# Patient Record
Sex: Male | Born: 1955
Health system: Southern US, Community
[De-identification: ages and names within clinical notes are randomized; demographics above are authoritative.]

## PROBLEM LIST (undated history)

## (undated) DIAGNOSIS — Z87442 Personal history of urinary calculi: Secondary | ICD-10-CM

## (undated) DIAGNOSIS — I10 Essential (primary) hypertension: Secondary | ICD-10-CM

## (undated) DIAGNOSIS — K635 Polyp of colon: Secondary | ICD-10-CM

## (undated) DIAGNOSIS — R011 Cardiac murmur, unspecified: Secondary | ICD-10-CM

## (undated) DIAGNOSIS — R519 Headache, unspecified: Secondary | ICD-10-CM

## (undated) DIAGNOSIS — M1990 Unspecified osteoarthritis, unspecified site: Secondary | ICD-10-CM

## (undated) DIAGNOSIS — E669 Obesity, unspecified: Secondary | ICD-10-CM

## (undated) DIAGNOSIS — Z720 Tobacco use: Secondary | ICD-10-CM

## (undated) DIAGNOSIS — T8859XA Other complications of anesthesia, initial encounter: Secondary | ICD-10-CM

## (undated) DIAGNOSIS — T4145XA Adverse effect of unspecified anesthetic, initial encounter: Secondary | ICD-10-CM

## (undated) DIAGNOSIS — I2510 Atherosclerotic heart disease of native coronary artery without angina pectoris: Secondary | ICD-10-CM

## (undated) DIAGNOSIS — Z9289 Personal history of other medical treatment: Secondary | ICD-10-CM

## (undated) DIAGNOSIS — I8290 Acute embolism and thrombosis of unspecified vein: Secondary | ICD-10-CM

## (undated) DIAGNOSIS — E785 Hyperlipidemia, unspecified: Secondary | ICD-10-CM

## (undated) DIAGNOSIS — R51 Headache: Secondary | ICD-10-CM

## (undated) HISTORY — DX: Atherosclerotic heart disease of native coronary artery without angina pectoris: I25.10

## (undated) HISTORY — DX: Hyperlipidemia, unspecified: E78.5

## (undated) HISTORY — DX: Essential (primary) hypertension: I10

## (undated) HISTORY — PX: HEMORRHOID SURGERY: SHX153

## (undated) HISTORY — DX: Obesity, unspecified: E66.9

## (undated) HISTORY — DX: Tobacco use: Z72.0

## (undated) HISTORY — DX: Polyp of colon: K63.5

## (undated) HISTORY — PX: CORONARY ARTERY BYPASS GRAFT: SHX141

## (undated) HISTORY — PX: CARDIAC CATHETERIZATION: SHX172

---

## 1984-01-11 HISTORY — PX: VASECTOMY: SHX75

## 1984-01-11 HISTORY — PX: CIRCUMCISION: SUR203

## 1997-07-03 ENCOUNTER — Inpatient Hospital Stay (HOSPITAL_COMMUNITY): Admission: AD | Admit: 1997-07-03 | Discharge: 1997-07-04 | Payer: Self-pay | Admitting: Internal Medicine

## 1997-10-06 ENCOUNTER — Other Ambulatory Visit: Admission: RE | Admit: 1997-10-06 | Discharge: 1997-10-06 | Payer: Self-pay | Admitting: Gastroenterology

## 1997-11-17 ENCOUNTER — Ambulatory Visit (HOSPITAL_COMMUNITY): Admission: RE | Admit: 1997-11-17 | Discharge: 1997-11-17 | Payer: Self-pay | Admitting: Gastroenterology

## 2001-05-02 ENCOUNTER — Ambulatory Visit (HOSPITAL_COMMUNITY): Admission: RE | Admit: 2001-05-02 | Discharge: 2001-05-02 | Payer: Self-pay | Admitting: Internal Medicine

## 2002-04-15 ENCOUNTER — Encounter: Payer: Self-pay | Admitting: Gastroenterology

## 2002-04-18 ENCOUNTER — Emergency Department (HOSPITAL_COMMUNITY): Admission: EM | Admit: 2002-04-18 | Discharge: 2002-04-18 | Payer: Self-pay | Admitting: Emergency Medicine

## 2002-04-18 ENCOUNTER — Encounter: Payer: Self-pay | Admitting: Emergency Medicine

## 2003-03-26 ENCOUNTER — Emergency Department (HOSPITAL_COMMUNITY): Admission: EM | Admit: 2003-03-26 | Discharge: 2003-03-26 | Payer: Self-pay | Admitting: Emergency Medicine

## 2004-01-08 ENCOUNTER — Ambulatory Visit: Payer: Self-pay | Admitting: Internal Medicine

## 2004-09-28 ENCOUNTER — Ambulatory Visit: Payer: Self-pay | Admitting: Internal Medicine

## 2004-10-19 ENCOUNTER — Ambulatory Visit: Payer: Self-pay | Admitting: Gastroenterology

## 2004-12-15 ENCOUNTER — Ambulatory Visit: Payer: Self-pay | Admitting: Internal Medicine

## 2005-01-20 ENCOUNTER — Ambulatory Visit: Payer: Self-pay | Admitting: Internal Medicine

## 2005-05-11 ENCOUNTER — Ambulatory Visit: Payer: Self-pay | Admitting: Internal Medicine

## 2005-05-23 ENCOUNTER — Ambulatory Visit: Payer: Self-pay

## 2005-06-13 ENCOUNTER — Ambulatory Visit: Payer: Self-pay | Admitting: Pulmonary Disease

## 2005-06-22 ENCOUNTER — Ambulatory Visit: Payer: Self-pay | Admitting: Internal Medicine

## 2005-10-11 ENCOUNTER — Ambulatory Visit: Payer: Self-pay | Admitting: Internal Medicine

## 2005-12-02 ENCOUNTER — Ambulatory Visit: Payer: Self-pay | Admitting: Internal Medicine

## 2006-08-10 ENCOUNTER — Ambulatory Visit: Payer: Self-pay | Admitting: Gastroenterology

## 2006-08-10 LAB — CONVERTED CEMR LAB
Basophils Absolute: 0 10*3/uL (ref 0.0–0.1)
Basophils Relative: 0.9 % (ref 0.0–1.0)
Eosinophils Absolute: 0.1 10*3/uL (ref 0.0–0.6)
Eosinophils Relative: 1.7 % (ref 0.0–5.0)
Ferritin: 69.4 ng/mL (ref 22.0–322.0)
HCT: 37.5 % — ABNORMAL LOW (ref 39.0–52.0)
Hemoglobin: 13.3 g/dL (ref 13.0–17.0)
Iron: 93 ug/dL (ref 42–165)
Lymphocytes Relative: 30.4 % (ref 12.0–46.0)
MCHC: 35.4 g/dL (ref 30.0–36.0)
MCV: 89.4 fL (ref 78.0–100.0)
Monocytes Absolute: 0.4 10*3/uL (ref 0.2–0.7)
Monocytes Relative: 7.8 % (ref 3.0–11.0)
Neutro Abs: 3.3 10*3/uL (ref 1.4–7.7)
Neutrophils Relative %: 59.2 % (ref 43.0–77.0)
Platelets: 249 10*3/uL (ref 150–400)
RBC: 4.2 M/uL — ABNORMAL LOW (ref 4.22–5.81)
RDW: 12 % (ref 11.5–14.6)
Saturation Ratios: 23.1 % (ref 20.0–50.0)
Transferrin: 287.6 mg/dL (ref >=212.0)
WBC: 5.4 10*3/uL (ref 4.5–10.5)

## 2006-10-19 ENCOUNTER — Ambulatory Visit (HOSPITAL_COMMUNITY): Admission: RE | Admit: 2006-10-19 | Discharge: 2006-10-19 | Payer: Self-pay | Admitting: General Surgery

## 2006-10-19 ENCOUNTER — Encounter (HOSPITAL_BASED_OUTPATIENT_CLINIC_OR_DEPARTMENT_OTHER): Payer: Self-pay | Admitting: General Surgery

## 2006-10-19 HISTORY — PX: OTHER SURGICAL HISTORY: SHX169

## 2007-02-23 ENCOUNTER — Telehealth (INDEPENDENT_AMBULATORY_CARE_PROVIDER_SITE_OTHER): Payer: Self-pay | Admitting: *Deleted

## 2007-03-06 ENCOUNTER — Ambulatory Visit: Payer: Self-pay | Admitting: Internal Medicine

## 2007-03-06 DIAGNOSIS — R079 Chest pain, unspecified: Secondary | ICD-10-CM | POA: Insufficient documentation

## 2007-05-09 ENCOUNTER — Ambulatory Visit: Payer: Self-pay | Admitting: Internal Medicine

## 2007-05-09 DIAGNOSIS — E785 Hyperlipidemia, unspecified: Secondary | ICD-10-CM | POA: Insufficient documentation

## 2007-05-09 DIAGNOSIS — E1169 Type 2 diabetes mellitus with other specified complication: Secondary | ICD-10-CM | POA: Insufficient documentation

## 2007-05-09 DIAGNOSIS — E7800 Pure hypercholesterolemia, unspecified: Secondary | ICD-10-CM | POA: Insufficient documentation

## 2007-05-09 DIAGNOSIS — N320 Bladder-neck obstruction: Secondary | ICD-10-CM | POA: Insufficient documentation

## 2007-05-10 LAB — CONVERTED CEMR LAB
ALT: 31 units/L (ref 0–53)
AST: 30 units/L (ref 0–37)
Albumin: 4.6 g/dL (ref 3.5–5.2)
Alkaline Phosphatase: 37 units/L — ABNORMAL LOW (ref 39–117)
BUN: 13 mg/dL (ref 6–23)
Basophils Absolute: 0 10*3/uL (ref 0.0–0.1)
Basophils Relative: 0.6 % (ref 0.0–1.0)
Bilirubin Urine: NEGATIVE
Bilirubin, Direct: 0.1 mg/dL (ref 0.0–0.3)
CO2: 33 meq/L — ABNORMAL HIGH (ref 19–32)
Calcium: 9.7 mg/dL (ref 8.4–10.5)
Chloride: 103 meq/L (ref 96–112)
Cholesterol: 146 mg/dL (ref 0–200)
Creatinine, Ser: 1 mg/dL (ref 0.4–1.5)
Eosinophils Absolute: 0.1 10*3/uL (ref 0.0–0.7)
Eosinophils Relative: 1.7 % (ref 0.0–5.0)
GFR calc Af Amer: 101 mL/min
GFR calc non Af Amer: 83 mL/min
Glucose, Bld: 107 mg/dL — ABNORMAL HIGH (ref 70–99)
HCT: 39.5 % (ref 39.0–52.0)
HDL: 28.9 mg/dL — ABNORMAL LOW (ref >39.0)
Hemoglobin, Urine: NEGATIVE
Hemoglobin: 13.5 g/dL (ref 13.0–17.0)
Ketones, ur: NEGATIVE mg/dL
LDL Cholesterol: 101 mg/dL — ABNORMAL HIGH (ref 0–99)
Leukocytes, UA: NEGATIVE
Lymphocytes Relative: 30.1 % (ref 12.0–46.0)
MCHC: 34.3 g/dL (ref 30.0–36.0)
MCV: 91.6 fL (ref 78.0–100.0)
Monocytes Absolute: 0.5 10*3/uL (ref 0.1–1.0)
Monocytes Relative: 8.8 % (ref 3.0–12.0)
Neutro Abs: 3.2 10*3/uL (ref 1.4–7.7)
Neutrophils Relative %: 58.8 % (ref 43.0–77.0)
Nitrite: NEGATIVE
PSA: 0.43 ng/mL (ref 0.10–4.00)
Platelets: 232 10*3/uL (ref 150–400)
Potassium: 4.1 meq/L (ref 3.5–5.1)
RBC: 4.31 M/uL (ref 4.22–5.81)
RDW: 13.6 % (ref 11.5–14.6)
Sodium: 141 meq/L (ref 135–145)
Specific Gravity, Urine: 1.02 (ref 1.000–1.03)
TSH: 1.09 microintl units/mL (ref 0.35–5.50)
Total Bilirubin: 1 mg/dL (ref 0.3–1.2)
Total CHOL/HDL Ratio: 5.1
Total Protein, Urine: NEGATIVE mg/dL
Total Protein: 7.4 g/dL (ref 6.0–8.3)
Triglycerides: 79 mg/dL (ref 0–149)
Urine Glucose: NEGATIVE mg/dL
Urobilinogen, UA: 0.2 (ref 0.0–1.0)
VLDL: 16 mg/dL (ref 0–40)
WBC: 5.5 10*3/uL (ref 4.5–10.5)
pH: 6 (ref 5.0–8.0)

## 2007-05-26 DIAGNOSIS — D126 Benign neoplasm of colon, unspecified: Secondary | ICD-10-CM | POA: Insufficient documentation

## 2007-05-26 DIAGNOSIS — J309 Allergic rhinitis, unspecified: Secondary | ICD-10-CM | POA: Insufficient documentation

## 2007-05-26 DIAGNOSIS — E669 Obesity, unspecified: Secondary | ICD-10-CM | POA: Insufficient documentation

## 2007-05-26 DIAGNOSIS — K648 Other hemorrhoids: Secondary | ICD-10-CM | POA: Insufficient documentation

## 2007-05-26 DIAGNOSIS — E785 Hyperlipidemia, unspecified: Secondary | ICD-10-CM | POA: Insufficient documentation

## 2007-05-26 DIAGNOSIS — Z87891 Personal history of nicotine dependence: Secondary | ICD-10-CM | POA: Insufficient documentation

## 2007-05-26 DIAGNOSIS — K5909 Other constipation: Secondary | ICD-10-CM | POA: Insufficient documentation

## 2007-05-26 DIAGNOSIS — I259 Chronic ischemic heart disease, unspecified: Secondary | ICD-10-CM | POA: Insufficient documentation

## 2007-05-26 DIAGNOSIS — I10 Essential (primary) hypertension: Secondary | ICD-10-CM | POA: Insufficient documentation

## 2007-05-26 DIAGNOSIS — Z8719 Personal history of other diseases of the digestive system: Secondary | ICD-10-CM | POA: Insufficient documentation

## 2007-11-20 ENCOUNTER — Ambulatory Visit: Payer: Self-pay | Admitting: Internal Medicine

## 2008-04-18 ENCOUNTER — Ambulatory Visit: Payer: Self-pay | Admitting: Internal Medicine

## 2008-07-17 ENCOUNTER — Ambulatory Visit: Payer: Self-pay | Admitting: Internal Medicine

## 2008-07-17 LAB — CONVERTED CEMR LAB
ALT: 43 units/L (ref 0–53)
AST: 41 units/L — ABNORMAL HIGH (ref 0–37)
Albumin: 4.7 g/dL (ref 3.5–5.2)
Alkaline Phosphatase: 44 units/L (ref 39–117)
BUN: 22 mg/dL (ref 6–23)
Basophils Absolute: 0 10*3/uL (ref 0.0–0.1)
Basophils Relative: 0.3 % (ref 0.0–3.0)
Bilirubin Urine: NEGATIVE
Bilirubin, Direct: 0.2 mg/dL (ref 0.0–0.3)
CO2: 31 meq/L (ref 19–32)
CRP: 0.1 mg/dL (ref <0.6)
Calcium: 9.6 mg/dL (ref 8.4–10.5)
Chloride: 102 meq/L (ref 96–112)
Cholesterol: 124 mg/dL (ref 0–200)
Creatinine, Ser: 1 mg/dL (ref 0.4–1.5)
Eosinophils Absolute: 0.1 10*3/uL (ref 0.0–0.7)
Eosinophils Relative: 1.9 % (ref 0.0–5.0)
GFR calc non Af Amer: 100.17 mL/min (ref >60)
Glucose, Bld: 106 mg/dL — ABNORMAL HIGH (ref 70–99)
HCT: 40.3 % (ref 39.0–52.0)
HDL: 32.7 mg/dL — ABNORMAL LOW (ref >39.00)
Hemoglobin, Urine: NEGATIVE
Hemoglobin: 13.9 g/dL (ref 13.0–17.0)
Ketones, ur: NEGATIVE mg/dL
LDL Cholesterol: 76 mg/dL (ref 0–99)
Leukocytes, UA: NEGATIVE
Lymphocytes Relative: 34.8 % (ref 12.0–46.0)
Lymphs Abs: 1.8 10*3/uL (ref 0.7–4.0)
MCHC: 34.6 g/dL (ref 30.0–36.0)
MCV: 90.3 fL (ref 78.0–100.0)
Monocytes Absolute: 0.5 10*3/uL (ref 0.1–1.0)
Monocytes Relative: 9.1 % (ref 3.0–12.0)
Neutro Abs: 2.8 10*3/uL (ref 1.4–7.7)
Neutrophils Relative %: 53.9 % (ref 43.0–77.0)
Nitrite: NEGATIVE
PSA: 0.46 ng/mL (ref 0.10–4.00)
Platelets: 233 10*3/uL (ref 150.0–400.0)
Potassium: 4.5 meq/L (ref 3.5–5.1)
RBC: 4.46 M/uL (ref 4.22–5.81)
RDW: 12.7 % (ref 11.5–14.6)
Sodium: 141 meq/L (ref 135–145)
Specific Gravity, Urine: >=1.03 (ref 1.000–1.030)
TSH: 0.95 microintl units/mL (ref 0.35–5.50)
Total Bilirubin: 1.2 mg/dL (ref 0.3–1.2)
Total CHOL/HDL Ratio: 4
Total Protein, Urine: NEGATIVE mg/dL
Total Protein: 7.6 g/dL (ref 6.0–8.3)
Triglycerides: 77 mg/dL (ref 0.0–149.0)
Urine Glucose: NEGATIVE mg/dL
Urobilinogen, UA: 0.2 (ref 0.0–1.0)
VLDL: 15.4 mg/dL (ref 0.0–40.0)
WBC: 5.2 10*3/uL (ref 4.5–10.5)
pH: 6 (ref 5.0–8.0)

## 2009-01-14 ENCOUNTER — Ambulatory Visit: Payer: Self-pay | Admitting: Internal Medicine

## 2009-01-14 DIAGNOSIS — R059 Cough, unspecified: Secondary | ICD-10-CM | POA: Insufficient documentation

## 2009-01-14 DIAGNOSIS — R05 Cough: Secondary | ICD-10-CM | POA: Insufficient documentation

## 2009-06-07 ENCOUNTER — Encounter: Payer: Self-pay | Admitting: Internal Medicine

## 2009-06-09 ENCOUNTER — Telehealth: Payer: Self-pay | Admitting: Internal Medicine

## 2009-06-15 ENCOUNTER — Ambulatory Visit: Payer: Self-pay | Admitting: Internal Medicine

## 2009-06-15 ENCOUNTER — Telehealth (INDEPENDENT_AMBULATORY_CARE_PROVIDER_SITE_OTHER): Payer: Self-pay | Admitting: *Deleted

## 2009-08-03 ENCOUNTER — Ambulatory Visit: Payer: Self-pay | Admitting: Internal Medicine

## 2009-08-03 ENCOUNTER — Encounter (INDEPENDENT_AMBULATORY_CARE_PROVIDER_SITE_OTHER): Payer: Self-pay | Admitting: *Deleted

## 2009-08-03 LAB — CONVERTED CEMR LAB
ALT: 41 units/L (ref 0–53)
AST: 40 units/L — ABNORMAL HIGH (ref 0–37)
Albumin: 4.7 g/dL (ref 3.5–5.2)
Alkaline Phosphatase: 42 units/L (ref 39–117)
BUN: 20 mg/dL (ref 6–23)
Basophils Absolute: 0.1 10*3/uL (ref 0.0–0.1)
Basophils Relative: 1 % (ref 0.0–3.0)
Bilirubin Urine: NEGATIVE
Bilirubin, Direct: 0.1 mg/dL (ref 0.0–0.3)
CO2: 30 meq/L (ref 19–32)
Calcium: 9.3 mg/dL (ref 8.4–10.5)
Chloride: 100 meq/L (ref 96–112)
Cholesterol: 128 mg/dL (ref 0–200)
Creatinine, Ser: 0.9 mg/dL (ref 0.4–1.5)
Eosinophils Absolute: 0.1 10*3/uL (ref 0.0–0.7)
Eosinophils Relative: 1.2 % (ref 0.0–5.0)
GFR calc non Af Amer: 111.25 mL/min (ref >60)
Glucose, Bld: 112 mg/dL — ABNORMAL HIGH (ref 70–99)
HCT: 38.2 % — ABNORMAL LOW (ref 39.0–52.0)
HDL: 31.7 mg/dL — ABNORMAL LOW (ref >39.00)
Hemoglobin, Urine: NEGATIVE
Hemoglobin: 13.4 g/dL (ref 13.0–17.0)
Ketones, ur: NEGATIVE mg/dL
LDL Cholesterol: 81 mg/dL (ref 0–99)
Leukocytes, UA: NEGATIVE
Lymphocytes Relative: 29.7 % (ref 12.0–46.0)
Lymphs Abs: 1.7 10*3/uL (ref 0.7–4.0)
MCHC: 35.1 g/dL (ref 30.0–36.0)
MCV: 91.1 fL (ref 78.0–100.0)
Monocytes Absolute: 0.2 10*3/uL (ref 0.1–1.0)
Monocytes Relative: 4 % (ref 3.0–12.0)
Neutro Abs: 3.6 10*3/uL (ref 1.4–7.7)
Neutrophils Relative %: 64.1 % (ref 43.0–77.0)
Nitrite: NEGATIVE
PSA: 0.52 ng/mL (ref 0.10–4.00)
Platelets: 228 10*3/uL (ref 150.0–400.0)
Potassium: 4 meq/L (ref 3.5–5.1)
RBC: 4.19 M/uL — ABNORMAL LOW (ref 4.22–5.81)
RDW: 13.1 % (ref 11.5–14.6)
Sodium: 136 meq/L (ref 135–145)
Specific Gravity, Urine: 1.02 (ref 1.000–1.030)
TSH: 0.97 microintl units/mL (ref 0.35–5.50)
Total Bilirubin: 0.6 mg/dL (ref 0.3–1.2)
Total CHOL/HDL Ratio: 4
Total Protein, Urine: NEGATIVE mg/dL
Total Protein: 7.4 g/dL (ref 6.0–8.3)
Triglycerides: 75 mg/dL (ref 0.0–149.0)
Urine Glucose: NEGATIVE mg/dL
Urobilinogen, UA: 0.2 (ref 0.0–1.0)
VLDL: 15 mg/dL (ref 0.0–40.0)
WBC: 5.6 10*3/uL (ref 4.5–10.5)
pH: 7 (ref 5.0–8.0)

## 2009-08-04 ENCOUNTER — Ambulatory Visit: Payer: Self-pay | Admitting: Internal Medicine

## 2009-08-28 ENCOUNTER — Encounter (INDEPENDENT_AMBULATORY_CARE_PROVIDER_SITE_OTHER): Payer: Self-pay | Admitting: *Deleted

## 2009-09-02 ENCOUNTER — Ambulatory Visit: Payer: Self-pay | Admitting: Gastroenterology

## 2009-09-16 ENCOUNTER — Ambulatory Visit: Payer: Self-pay | Admitting: Gastroenterology

## 2009-09-22 ENCOUNTER — Encounter: Payer: Self-pay | Admitting: Gastroenterology

## 2010-02-09 NOTE — Letter (Signed)
Summary: Previsit letter  [facility]  [address] [place], [place] [address]   Phone: [phone]  Fax: [phone]       08/03/2009 MRN: [id number]  [facility] [address] [place], [place]  [address]  Dear Mr. [name] [name],  Welcome to the Gastroenterology Division at [facility].    You are scheduled to see a nurse for your pre-procedure visit on 09-02-09 at 9:00A.M. on the 3rd floor at [facility], [address]  We ask that you try to arrive at our office 15 minutes prior to your appointment time to allow for check-in.  Your nurse visit will consist of discussing your medical and surgical history, your immediate family medical history, and your medications.    Please bring a complete list of all your medications or, if you prefer, bring the medication bottles and we will list them.  We will need to be aware of both prescribed and over the counter drugs.  We will need to know exact dosage information as well.  If you are on blood thinners (Coumadin, Plavix, Aggrenox, Ticlid, etc.) please call our office today/prior to your appointment, as we need to consult with your physician about holding your medication.   Please be prepared to read and sign documents such as consent forms, a financial agreement, and acknowledgement forms.  If necessary, and with your consent, a friend or relative is welcome to sit-in on the nurse visit with you.  Please bring your insurance card so that we may make a copy of it.  If your insurance requires a referral to see a specialist, please bring your referral form from your primary care physician.  No co-pay is required for this nurse visit.     If you cannot keep your appointment, please call [phone] to cancel or reschedule prior to your appointment date.  This allows [place] the opportunity to schedule an appointment for another patient in need of care.    Thank you for choosing [facility] for your medical  needs.  We appreciate the opportunity to care for you.  Please visit [place] at our website  to learn more about our practice.                     Sincerely.                                                                                                                   The Gastroenterology Division

## 2010-02-09 NOTE — Progress Notes (Signed)
Summary: med change-LMTCB x 1  Phone Note Outgoing Call   Call placed by: Vernie Murders,  Jun 09, 2009 3:13 PM Call placed to: Patient Summary of Call: Per MW- okay to change pt from vytorin to simvastatin 40 mg due to cost issues.  Will send new rx once I speak with pt.  LMTCB. Initial call taken by: Vernie Murders,  Jun 09, 2009 3:14 PM  Follow-up for Phone Call        Spoke with pt and advised of the above.  He states that he would like to switch to simvastatin.  Rx was sent to [address]. Follow-up by: Vernie Murders,  June 10, 2009 8:38 AM    New/Updated Medications: SIMVASTATIN 40 MG TABS (SIMVASTATIN) 1 by mouth at bedtime Prescriptions: SIMVASTATIN 40 MG TABS (SIMVASTATIN) 1 by mouth at bedtime  #30 x 6   Entered by:   Vernie Murders   Authorized by:   Nyoka Cowden MD   Signed by:   Vernie Murders on 06/10/2009   Method used:   Electronically to        [id number] (retail)       [address]       [place], [place]  [address]       Ph: [phone] or [phone]       Fax: [phone]   RxID:   [id number]

## 2010-02-09 NOTE — Assessment & Plan Note (Signed)
Summary: CPX/ MBW   Visit Type:  CPX PCP:  Chairty Toman  Chief Complaint:  cpx fasting.Marland Kitchen  History of Present Illness:  55 yo black male former smoker with hypertension hyperlipidemia and atypical chest pain with a left heart catheter showing 60% obstruction of the right-sided branches in the 1999 and a negative stress study in May of 2007.  Recent office visit for classic ms cp relieved by nsaids with no cp in interim, though not aerobically active.  he denies any TIA or claudication symptoms.  He has had episodes of numbness that occur in his extremities typically when he sits or lies down for a long period of time never in a specific pattern or involving more than one extremity or associated with any muscle weakness, just a sensation of numbness, and not in a radicular pattern.  He denies any neck pain or back pain or muscle aches.  April 29  Meds:  DIOVAN HCT 160-12.5 MG TABS (VALSARTAN-HYDROCHLOROTHIAZIDE)  METOPROLOL TARTRATE 50 MG TABS (METOPROLOL TARTRATE) one half two times a day VYTORIN 10-20 MG TABS (EZETIMIBE-SIMVASTATIN)  ADULT ASPIRIN LOW STRENGTH 81 MG  TBDP (ASPIRIN) 1 by mouth daily       Current Allergies (reviewed today): ! CODEINE  Past Medical History:    Reviewed history from 03/06/2007 and no changes required:              HYPERTENSION, BENIGN ESSENTIAL, CONTROLLED (ICD-401.1)       CHEST PAIN (ICD-786.50)        coronary artery disease with 60% obstruction 1999 RCA negative stress Cardiolite May 2007          Family History:    Reviewed history from 03/06/2007 and no changes required:       hypertension in mother and father number premature heart disease in any direct relatives nor stroke diabetes or cancer, except that his mother has developed leukemia  Social History:    Reviewed history from 03/06/2007 and no changes required:       quit smoking about 1990 denies alcohol use.  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     Vital Signs:  Patient Profile:   55 Years Old Male Weight:      202 pounds O2 Sat:      99 % O2 treatment:    Room Air Temp:     97.8 degrees F oral Pulse rate:   53 / minute BP sitting:   130 / 88  (left arm)  Vitals Entered By: Vernie Murders (May 09, 2007 8:43 AM)                 Physical Exam    Ambulatory healthy appearing black male in no acute distress. Afeb with normal vital signs HEENT: nl dentition, turbinates, and orophanx. Nl external ear canals without cough reflex. Neck without JVD/Nodes/TM. carotid upstrokes brisk without bruits Lungs clear to A and P bilaterally without cough on insp or exp maneuvers RRR no s3 or murmur or increase in P2. no significant displacement of PMI Abd soft and benign with nl excursion in the  supine position. No bruits or organomegaly Ext warm without calf tenderness, cyanosis clubbing or edema. normal symmetric pedal pulses Skin warm and dry without lesions except for several patches of hyperpigmentation over both arms at the elbow flexor surface. genitourinary testes descended bilaterally with no nodules.  Uncircumcised.  No inguinal hernia.  Rectal root minimal BPH smooth texture  neurologic no focal deficits or pathologic reflexes in sensorium was intact.  Musculoskeletal exam was unremarkable with full range of motion of shoulders elbows hips knees and ankles   EKG  Procedure date:  05/09/2007  Findings:      EKG shows a sinus  bradycardia with no definite ischemic hypertrophic change    Impression & Recommendations:  Problem # 1:  HYPERTENSION, BENIGN ESSENTIAL, CONTROLLED (ICD-401.1) blood pressure is not ideal today but he has no evidence of secondary organ dysfunction His updated medication list for this problem includes:    Diovan Hct 160-12.5 Mg Tabs (Valsartan-hydrochlorothiazide) ..... One daily    Metoprolol Tartrate 50 Mg Tabs (Metoprolol tartrate) ..... One half two times a day  Orders: TLB-Udip ONLY (81003-UDIP) Est. Patient Level V ([address])   Problem # 2:  HYPERCHOLESTEROLEMIA (ICD-272.0) technically LDL target is less than 80 based on the fact that he has evidence of coronary artery disease and is hypertensive.  He is starting taking Vytorin and simply needs to increase aerobic activity to try to get his weight closer to the degree target of less than 171.  We discussed additional screening but in the absence of any exertional symptoms I believe this would be very low yield. His updated medication list for this problem includes:    Vytorin 10-20 Mg Tabs (Ezetimibe-simvastatin)  Orders: TLB-BMP (Basic Metabolic Panel-BMET) (80048-METABOL) TLB-CBC Platelet - w/Differential (85025-CBCD) TLB-TSH (Thyroid Stimulating Hormone) (84443-TSH) TLB-Hepatic/Liver Function Pnl (80076-HEPATIC) TLB-Lipid Panel (80061-LIPID) Est. Patient Level V ([address])   Problem # 3:  OVERWEIGHT (ICD-278.02)  Weight control is a matter of calorie balance which needs to be tilted in the pt's favor by eating less and exercising more.  Specifically, I recommended  exercise at a level where pt  is short of breath but not out of breath 30 minutes daily.  If not losing weight on this program, I would strongly recommend pt see a nutritionist with a food diary recorded for two weeks prior to the visit.  Problem # 4:  SKIN RASH (ICD-782.1) this is not itching but looks like eczematous dermatitis or possibly tinea versicolor.   Recommend trial of Lotrisone follow-up by dermatology if not eliminated. His updated medication list for this problem includes:    Lotrisone 1-0.05 % Crea (Clotrimazole-betamethasone) .Marland Kitchen... Apply two times a day as needed   Medications Added to Medication List This Visit: 1)  Diovan Hct 160-12.5 Mg Tabs (Valsartan-hydrochlorothiazide) .... One daily 2)  Lotrisone 1-0.05 % Crea (Clotrimazole-betamethasone) .... Apply two times a day as needed  Other Orders: EKG w/ Interpretation (93000) Tdap => 78yrs IM (09811) Admin 1st Vaccine (91478) TLB-PSA (Prostate Specific Antigen) (84153-PSA)   Patient Instructions: 1)  Weight control is simply a matter of calorie balance which needs to be tilted in your favor by eating less and exercising more.  To get the most out of exercise, you need to be continuously aware that you are short of breath, but never out of breath, for 30 minutes daily. As you improve, it will actually be easier for you to do the same amount in  30 minutes so always push to the level where you are  short of breath.   2)  Please schedule a follow-up appointment in 3 months.    Prescriptions: LOTRISONE 1-0.05 %  CREA (CLOTRIMAZOLE-BETAMETHASONE) apply two times a day as needed  #30 gm x 11   Entered and Authorized by:   Nyoka Cowden MD   Signed by:   Vernie Murders on 05/09/2007   Method used:   [facility] ...       [id number]       [address]       [place], [place]  [address]       Ph: [phone] or [phone]       Fax: [phone]   RxID:   [id number] VYTORIN 10-20 MG TABS (EZETIMIBE-SIMVASTATIN)   #30 x 0   Entered and Authorized by:   Nyoka Cowden MD   Signed by:   Vernie Murders on 05/09/2007   Method used:   [facility] to ...       [id number]       [address]       [place], [place]  [address]       Ph: [phone] or [phone]       Fax:  [phone]   RxID:   [id number] DIOVAN HCT 160-12.5 MG TABS (VALSARTAN-HYDROCHLOROTHIAZIDE) one daily  #30 x 11   Entered and Authorized by:   Nyoka Cowden MD   Signed by:   Vernie Murders on 05/09/2007   Method used:   [facility] to ...       [id number]       [address]       [place], [place]  [address]       Ph: [phone] or [phone]       Fax: [phone]   RxID:   [id number]  ]  Tetanus/Td Vaccine    Vaccine Type: Tdap    Site: left deltoid    Mfr: Sanofi Pasteur    Dose: 0.5 ml    Route: IM    Given by: Vernie Murders    Exp. Date: 01/28/2009    Lot #: [id number]    VIS given: 11/28/06 version given May 09, 2007.

## 2010-02-09 NOTE — Assessment & Plan Note (Signed)
Summary: Primary svc/ fu HBP/hyperlipidemia   Primary Provider/Referring Provider:  Sherene Sires  CC:  Followup.  Pt needs med refills.  Pt c/o having some indigestion recently.  Denies any other complaints..  History of Present Illness:  61 yobm quit smoking  in early 1980's with hypertension hyperlipidemia and atypical chest pain with a left heart catheter showing 60% obstruction of the right-sided branches in the 1999 and a negative stress study in May of 2007.   November 20, 2007 ov to refill on vytorin with last ldl 101, no change in rx  April 18, 2008 ov for refill on bp meds, not aerobically active but no ex cp, sob.  Current Medications (verified): 1)  Diovan Hct 160-12.5 Mg Tabs (Valsartan-Hydrochlorothiazide) .... One Daily 2)  Metoprolol Tartrate 50 Mg Tabs (Metoprolol Tartrate) .... One Half Two Times A Day 3)  Vytorin 10-20 Mg Tabs (Ezetimibe-Simvastatin) .Marland Kitchen.. 1 Once Daily 4)  Adult Aspirin Low Strength 81 Mg  Tbdp (Aspirin) .Marland Kitchen.. 1 By Mouth Daily 5)  Lotrisone 1-0.05 %  Crea (Clotrimazole-Betamethasone) .... Apply Two Times A Day As Needed  Allergies (verified): 1)  ! Codeine  Past History:  Past Medical History:    HYPERTENSION, BENIGN ESSENTIAL, CONTROLLED (ICD-401.1)    CHEST PAIN (ICD-786.50)         -I IHD with 60% obstruction 1999 RCA negative stress Cardiolite May 2007    HYPERLIPIDEMIA         - target LDL less than 70 based on documented ischemic heart disease    HEALTH MAINTENANCE.......................................................................................[name]  Vital Signs:  Patient profile:   55 year old male Height:      67 inches Weight:      205.25 pounds BMI:     32.26 Temp:     98.1 degrees F oral Pulse rate:   78 / minute BP sitting:   110 / 70  (left arm)  Vitals Entered By: Vernie Murders (April 18, 2008 4:05 PM)  O2 Sat on room air at rest %:  96  Physical Exam  Additional Exam:   Ambulatory healthy appearing black male in no  acute distress. Afeb with normal vital signs  wt 202 > 201 November 20, 2007  > 205 April 18, 2008  HEENT: nl dentition, turbinates, and orophanx. Neck without JVD/Nodes/TM. carotid upstrokes brisk without bruits Lungs clear to A and P bilaterally without cough on insp or exp maneuvers RRR no s3 or murmur or increase in P2. no edema Abd soft and benign with nl excursion in the supine position.  Ext warm without calf tenderness, cyanosis clubbing   Impression & Recommendations:  Problem # 1:  HYPERTENSION (ICD-401.9)  ok on rx His updated medication list for this problem includes:    Diovan Hct 160-12.5 Mg Tabs (Valsartan-hydrochlorothiazide) ..... One daily    Metoprolol Tartrate 50 Mg Tabs (Metoprolol tartrate) ..... One half two times a day  Orders: Est. Patient Level III ([address])  Problem # 2:  HYPERCHOLESTEROLEMIA (ICD-272.0)  Not quite at target stated in pmhx, recheck at cpx His updated medication list for this problem includes:    Vytorin 10-20 Mg Tabs (Ezetimibe-simvastatin) .Marland Kitchen... 1 once daily  Labs Reviewed: SGOT: 30 (05/09/2007)   SGPT: 31 (05/09/2007)   HDL:28.9 (05/09/2007)  LDL:101 (05/09/2007)  Chol:146 (05/09/2007)  Trig:79 (05/09/2007)  Orders: Est. Patient Level III ([address])  Patient Instructions: 1)  Please schedule a follow-up appointment in 3 months. 2)  keep working on diet and ex

## 2010-02-09 NOTE — Miscellaneous (Signed)
Summary: [facility] previsit.  Clinical Lists Changes  Medications: Added new medication of MOVIPREP 100 GM  SOLR (PEG-KCL-NACL-NASULF-NA ASC-C) As per prep instructions. - Signed Rx of MOVIPREP 100 GM  SOLR (PEG-KCL-NACL-NASULF-NA ASC-C) As per prep instructions.;  #1 x 0;  Signed;  Entered by: Karl Bales RN;  Authorized by: Mardella Layman MD [facility];  Method used: [id number] [address], [place], [place]  [address], Ph: [phone] or [phone], Fax: [phone]    Prescriptions: MOVIPREP 100 GM  SOLR (PEG-KCL-NACL-NASULF-NA ASC-C) As per prep instructions.  #1 x 0   Entered by:   Karl Bales RN   Authorized by:   Mardella Layman MD [facility]   Signed by:   Karl Bales RN on 09/02/2009   Method used:   Electronically to        [facility]       [address]       [place], [place]  [address]       Ph: [phone] or [phone]       Fax: [phone]   RxID:   [id number]

## 2010-02-09 NOTE — Letter (Signed)
Summary: [facility] Instructions  [facility]  [address] [place], [place] [address]   Phone: [phone]  Fax: [phone]       [name] [name]    10-01-1955    MRN: [id number]        Procedure Day Dorna Bloom: Wednesday 09-16-09     Arrival Time: 8:00 a.m.     Procedure Time: 9:00 a.m.     Location of Procedure:                    _x_  [facility] (4th Floor)                        PREPARATION FOR COLONOSCOPY WITH MOVIPREP   Starting 5 days prior to your procedure  09-11-09 friday do not eat nuts, seeds, popcorn, corn, beans, peas,  salads, or any raw vegetables.  Do not take any fiber supplements (e.g. Metamucil, Citrucel, and Benefiber).  THE DAY BEFORE YOUR PROCEDURE         DATE:  09-15-09 DAY:  Tuesday   1.  Drink clear liquids the entire day-NO SOLID FOOD  2.  Do not drink anything colored red or purple.  Avoid juices with pulp.  No orange juice.  3.  Drink at least 64 oz. (8 glasses) of fluid/clear liquids during the day to prevent dehydration and help the prep work efficiently.  CLEAR LIQUIDS INCLUDE: Water Jello Ice Popsicles Tea (sugar ok, no milk/cream) Powdered fruit flavored drinks Coffee (sugar ok, no milk/cream) Gatorade Juice: apple, white grape, white cranberry  Lemonade Clear bullion, consomm, broth Carbonated beverages (any kind) Strained chicken noodle soup Hard Candy                             4.  In the morning, mix first dose of MoviPrep solution:    Empty 1 Pouch A and 1 Pouch B into the disposable container    Add lukewarm drinking water to the top line of the container. Mix to dissolve    Refrigerate (mixed solution should be used within 24 hrs)  5.  Begin drinking the prep at 5:00 p.m. The MoviPrep container is divided by 4 marks.   Every 15 minutes drink the solution down to the next mark (approximately 8 oz) until the full liter is complete.   6.  Follow completed prep with 16 oz of clear liquid of your  choice (Nothing red or purple).  Continue to drink clear liquids until bedtime.  7.  Before going to bed, mix second dose of MoviPrep solution:    Empty 1 Pouch A and 1 Pouch B into the disposable container    Add lukewarm drinking water to the top line of the container. Mix to dissolve    Refrigerate  THE DAY OF YOUR PROCEDURE      DATE:  09-16-09  DAY:  Wednesday  Beginning at  4:00 a.m. (5 hours before procedure):         1. Every 15 minutes, drink the solution down to the next mark (approx 8 oz) until the full liter is complete.  2. Follow completed prep with 16 oz. of clear liquid of your choice.    3. You may drink clear liquids until  7:00 a.m.  (2 HOURS BEFORE PROCEDURE).   MEDICATION INSTRUCTIONS  Unless otherwise instructed, you should take regular prescription medications with a small sip of water  as early as possible the morning of your procedure. Additional medication instructions: HOLD Diovan HCT the day of your procedure only before coming in (09-16-09 only)         OTHER INSTRUCTIONS  You will need a responsible adult at least 56 years of age to accompany you and drive you home.   This person must remain in the waiting room during your procedure.  Wear loose fitting clothing that is easily removed.  Leave jewelry and other valuables at home.  However, you may wish to bring a book to read or  an iPod/MP3 player to listen to music as you wait for your procedure to start.  Remove all body piercing jewelry and leave at home.  Total time from sign-in until discharge is approximately 2-3 hours.  You should go home directly after your procedure and rest.  You can resume normal activities the  day after your procedure.  The day of your procedure you should not:   Drive   Make legal decisions   Operate machinery   Drink alcohol   Return to work  You will receive specific instructions about eating, activities and medications before you leave.    The  above instructions have been reviewed and explained to me by   Karl Bales RN  September 02, 2009 9:25 AM    I fully understand and can verbalize these instructions _____________________________ Date _________

## 2010-02-09 NOTE — Procedures (Signed)
Summary: Gastroenterology colon  Gastroenterology colon   Imported By: Donneta Romberg Endo Tech 05/31/2007 16:08:43  _____________________________________________________________________  External Attachment:    Type:   Image     Comment:   External Document

## 2010-02-09 NOTE — Assessment & Plan Note (Signed)
Summary: Primary svc/ fu hbp and hyperlipidemia   PCP:  Darnetta Kesselman  Chief Complaint:  Pt returns for followup.  He denies any complaints today.  Needs med refills..  History of Present Illness:  55 yo black male former smoker with hypertension hyperlipidemia and atypical chest pain with a left heart catheter showing 60% obstruction of the right-sided branches in the 1999 and a negative stress study in May of 2007.   November 20, 2007 ov to refill on vytorin with last ldl 101, no chest pain TIA or claudication symptoms     Updated Prior Medication List: DIOVAN HCT 160-12.5 MG TABS (VALSARTAN-HYDROCHLOROTHIAZIDE) one daily METOPROLOL TARTRATE 50 MG TABS (METOPROLOL TARTRATE) one half two times a day VYTORIN 10-20 MG TABS (EZETIMIBE-SIMVASTATIN) 1 once daily ADULT ASPIRIN LOW STRENGTH 81 MG  TBDP (ASPIRIN) 1 by mouth daily LOTRISONE 1-0.05 %  CREA (CLOTRIMAZOLE-BETAMETHASONE) apply two times a day as needed  Current Allergies (reviewed today): ! CODEINE  Past Medical History:    HYPERTENSION, BENIGN ESSENTIAL, CONTROLLED (ICD-401.1)    CHEST PAIN (ICD-786.50)         -I IHD with 60% obstruction 1999 RCA negative stress Cardiolite May 2007    HYPERLIPIDEMIA         - target LDL less than 70 based on documented ischemic heart disease   Family History:    Reviewed history from 03/06/2007 and no changes required:       hypertension in mother and father number premature heart disease in any direct relatives nor stroke diabetes or cancer, except that his mother has developed leukemia  Social History:    Reviewed history from 03/06/2007 and no changes required:       quit smoking about 1990 denies alcohol use.  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 Afeb with normal vital signs  wt 202 > 201 November 20, 2007  HEENT: nl dentition, turbinates, and orophanx. Nl external ear canals without cough reflex. Neck without JVD/Nodes/TM. carotid upstrokes brisk without bruits Lungs clear to A and P bilaterally without cough on insp or exp maneuvers RRR no s3 or murmur or increase in P2. no significant displacement of PMI Abd soft and benign with nl excursion in the supine position. No bruits or organomegaly Ext warm without calf tenderness, cyanosis clubbing or edema. normal symmetric pedal pulses Skin warm and dry without lesions except for several patches of hyperpigmentation over both arms at the elbow flexor surface.       Impression & Recommendations:  Problem # 1:  HYPERTENSION (ICD-401.9)  His updated medication list for this problem includes:    Diovan Hct 160-12.5 Mg Tabs (Valsartan-hydrochlorothiazide) ..... One daily    Metoprolol Tartrate 50 Mg Tabs (Metoprolol tartrate) ..... One half two times a day  Orders: Est. Patient Level III ([address])   Problem # 2:  DYSLIPIDEMIA (  ICD-272.4) not quite to target, keep working on diet and exercise to  target LDL < 80  His updated medication list for this problem includes:    Vytorin 10-20 Mg Tabs (Ezetimibe-simvastatin) .Marland Kitchen... 1 once daily  Labs Reviewed: Chol: 146 (05/09/2007)   HDL: 28.9 (05/09/2007)   LDL: 101 (05/09/2007)   TG: 79 (05/09/2007) SGOT: 30 (05/09/2007)   SGPT: 31 (05/09/2007)  Orders: Est. Patient Level III ([address])   Complete Medication List: 1)  Diovan Hct 160-12.5 Mg Tabs (Valsartan-hydrochlorothiazide) .... One daily 2)  Metoprolol Tartrate 50 Mg Tabs (Metoprolol tartrate) .... One half two times a day 3)  Vytorin 10-20 Mg Tabs (Ezetimibe-simvastatin) .Marland Kitchen.. 1 once daily 4)  Adult  Aspirin Low Strength 81 Mg Tbdp (Aspirin) .Marland Kitchen.. 1 by mouth daily 5)  Lotrisone 1-0.05 % Crea (Clotrimazole-betamethasone) .... Apply two times a day as needed   Patient Instructions: 1)  Weight control is a matter of calorie balance which needs to be tilted in the pt's favor by eating less and exercising more.  Specifically, I recommended  exercise at a level where pt  is short of breath but not out of breath 30 minutes daily.  If not losing weight on this program, I would strongly recommend pt see a nutritionist with a food diary recorded for two weeks prior to the visit.    2)  Schedule CPX 04/2008   Prescriptions: VYTORIN 10-20 MG TABS (EZETIMIBE-SIMVASTATIN) 1 once daily  #30 x 11   Entered and Authorized by:   Nyoka Cowden MD   Signed by:   Nyoka Cowden MD on 11/20/2007   Method used:   [id number]       [address]       [place], [place]  [address]       Ph: [phone] or [phone]       Fax: [phone]   RxID:   [id number]  ]

## 2010-02-09 NOTE — Medication Information (Signed)
Summary: [facility]atin[facility]  [facility]atin/[facility]   Imported By: Sherian Rein 06/12/2009 09:26:49  _____________________________________________________________________  External Attachment:    Type:   Image     Comment:   External Document

## 2010-02-09 NOTE — Letter (Signed)
Summary: Patient Notice- Polyp Results  [facility]  [address] [place], [place] [address]   Phone: [phone]  Fax: [phone]        September 22, 2009 MRN: [id number]    [facility] [address] [place], [place]  [address]    Dear [name] [name],  I am pleased to inform you that the colon polyp(s) removed during your recent colonoscopy was (were) found to be benign (no cancer detected) upon pathologic examination.  I recommend you have a repeat colonoscopy examination in 5_ years to look for recurrent polyps, as having colon polyps increases your risk for having recurrent polyps or even colon cancer in the future.  Should you develop new or worsening symptoms of abdominal pain, bowel habit changes or bleeding from the rectum or bowels, please schedule an evaluation with either your primary care physician or with me.  Additional information/recommendations:  _x_ No further action with gastroenterology is needed at this time. Please      follow-up with your primary care physician for your other healthcare      needs.  __ Please call [phone] to schedule a return visit to review your      situation.  __ Please keep your follow-up visit as already scheduled.  __ Continue treatment plan as outlined the day of your exam.  Please call us if you are having persistent problems or have questions about your condition that have not been fully answered at this time.  Sincerely,  Mardella Layman MD [facility]  This letter has been electronically signed by your physician.  Appended Document: Patient Notice- Polyp Results letter mailed

## 2010-02-09 NOTE — Assessment & Plan Note (Signed)
Summary: Primary svc/cpx   Primary Provider/Referring Provider:  Sherene Sires  CC:  cpx fasting.  History of Present Illness: 28 yobm quit smoking  in early 1980's with hypertension hyperlipidemia and atypical chest pain with a left heart catheter showing 60% obstruction of the right-sided branches in the 1999 and a negative stress study in May of 2007.   November 20, 2007 ov to refill on vytorin with last ldl 101, no change in rx  April 18, 2008 ov for refill on bp meds, not aerobically active but no ex cp, sob.  July 17, 2008 cpx 2.8 miles power walk q am without difficulty  Current Medications (verified): 1)  Diovan Hct 160-12.5 Mg Tabs (Valsartan-Hydrochlorothiazide) .... One Daily 2)  Metoprolol Tartrate 50 Mg Tabs (Metoprolol Tartrate) .... One Half Two Times A Day 3)  Vytorin 10-20 Mg Tabs (Ezetimibe-Simvastatin) .Marland Kitchen.. 1 Once Daily 4)  Adult Aspirin Low Strength 81 Mg  Tbdp (Aspirin) .Marland Kitchen.. 1 By Mouth Daily 5)  Lotrisone 1-0.05 %  Crea (Clotrimazole-Betamethasone) .... Apply Two Times A Day As Needed 6)  Advil 200 Mg Tabs (Ibuprofen) .... As Needed  Allergies (verified): 1)  ! Codeine  Past History:  Past Medical History: HYPERTENSION, BENIGN ESSENTIAL, CONTROLLED (ICD-401.1) OBESITY    - Target wt  = 190   for BMI < 30  Colon polyps....................................................................................................[name]     - colonoscopy 4/04, letter sent 05/15/07 reviewed with pt July 17, 2008  CHEST PAIN (ICD-786.50)      - IHD with 60% obstruction 1999 RCA negative stress Cardiolite May 2007 HYPERLIPIDEMIA      - target LDL less than 70 based on documented ischemic heart disease HEALTH MAINTENANCE.......................................................................................[name]      - TD  April 2009      - CPX  July 17, 2008   Family History: hypertension in mother and father no premature heart disease in any direct relatives nor stroke  diabetes or cancer, except that his mother has developed leukemia  Social History: quit smoking about 1990 denies alcohol use.  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    Vital Signs:  Patient profile:   55 year old male Weight:      201.13 pounds O2 Sat:      97 % on Room air Temp:     98.1 degrees F oral Pulse rate:   65 / minute BP sitting:   108 / 60  (left arm)  Vitals Entered By: Vernie Murders (July 17, 2008 9:00 AM)  O2 Flow:  Room air  Physical Exam  Additional Exam:   Ambulatory healthy appearing black male in no acute distress. Afeb with normal vital signs  wt 202 > 201 November 20, 2007  > 205 April 18, 2008 > 201 July 17, 2008  HEENT: nl dentition, turbinates, and orophanx. Neck without JVD/Nodes/TM. carotid upstrokes brisk without bruits Lungs clear to A and P bilaterally without cough on insp or exp maneuvers RRR no s3 or murmur or increase in P2. no edema Abd soft and benign with nl excursion in the supine position.  Ext warm without calf tenderness,  cyanosis clubbing GU  uncircum nl testes rectal mild bph, smooth texture, stool guaiac neg   EKG  Procedure date:  07/17/2008  Findings:      nsr, lae, minimal nssttwchanges  CXR  Procedure date:  07/17/2008  Findings:      wnl  Impression & Recommendations:  Problem # 1:  HYPERTENSION, BENIGN ESSENTIAL, CONTROLLED (ICD-401.1) complicated by prob lvh and ihd.  His updated medication list for  this problem includes:    Diovan Hct 160-12.5 Mg Tabs (Valsartan-hydrochlorothiazide) ..... One daily    Metoprolol Tartrate 50 Mg Tabs (Metoprolol tartrate) ..... One half two times a day  Problem # 2:  DYSLIPIDEMIA (ICD-272.4) Target < 70 LDL due to IHD   101 > 76 on rx so continue  His updated medication list for this problem includes:    Vytorin 10-20 Mg Tabs (Ezetimibe-simvastatin) .Marland Kitchen... 1 once daily  Labs Reviewed: SGOT: 30 (05/09/2007)   SGPT: 31 (05/09/2007)   HDL:28.9 (05/09/2007)  LDL:101 (05/09/2007)  Chol:146 (05/09/2007)  Trig:79 (05/09/2007)  Problem # 3:  COLONIC POLYPS (ICD-211.3) reviewed letter to schedule repeat colonoscopy, now 1 year overdue   Problem # 4:  OBESITY (ICD-278.00) Target wt  = 190   for BMI < 30    Weight control is a matter of calorie balance which needs to be tilted in the pt's favor by eating less and exercising more.  Specifically, I recommended  exercise at a level where pt  is short of breath but not out of breath 30 minutes daily.  If not losing weight on this program, I would strongly recommend pt see a nutritionist with a food diary recorded for two weeks prior to the visit.     Medications Added to Medication List This Visit: 1)  Advil 200 Mg Tabs (Ibuprofen) .... As needed  Other Orders: EKG w/ Interpretation ([address]) TLB-Lipid Panel (80061-LIPID) TLB-BMP (Basic Metabolic Panel-BMET) (80048-METABOL) TLB-CBC Platelet - w/Differential (85025-CBCD) TLB-Hepatic/Liver Function Pnl (80076-HEPATIC) TLB-TSH (Thyroid Stimulating Hormone) (84443-TSH) TLB-Udip ONLY (81003-UDIP) TLB-PSA (Prostate Specific Antigen) (84153-PSA) T-2 View CXR, Same Day (71020.5TC) Est. Patient 40-64 years ([address])  Patient Instructions: 1)  keep up the walking 2)  call GI re colonoscopy (see letter dated 05/2007) 3)  call [phone] early next week for labs 4)  Please schedule a follow-up appointment in 6 months.

## 2010-02-09 NOTE — Procedures (Signed)
Summary: Colonoscopy  Patient: [name] [name] Note: All result statuses are Final unless otherwise noted.  Tests: (1) Colonoscopy (COL)   COL Colonoscopy           DONE     [facility]     [address]     [place], [place]  [address]           COLONOSCOPY PROCEDURE REPORT           PATIENT:  [name] [name], [name] [name]  MR#:  [id number]     BIRTHDATE:  Jul 02, 1955, 54 yrs. old  GENDER:  male     ENDOSCOPIST:  Vania Rea. Jarold Motto, MD, [facility]     REF. BY:  Charlaine Dalton. Sherene Sires, M.D.     PROCEDURE DATE:  09/16/2009     PROCEDURE:  Colonoscopy with snare polypectomy     ASA CLASS:  Class II     INDICATIONS:  history of pre-cancerous (adenomatous) colon polyps           MEDICATIONS:   Fentanyl 37.5 mg IV, Versed 3 mg IV           DESCRIPTION OF PROCEDURE:   After the risks benefits and     alternatives of the procedure were thoroughly explained, informed     consent was obtained.  Digital rectal exam was performed and     revealed no abnormalities.   The LB [id number] endoscope     was introduced through the anus and advanced to the cecum, which     was identified by both the appendix and ileocecal valve, without     limitations.  The quality of the prep was excellent, using     MoviPrep.  The instrument was then slowly withdrawn as the colon     was fully examined.     <<PROCEDUREIMAGES>>           FINDINGS:  There were multiple polyps identified and removed. in     the cecum. SMALL 2-3 MMM CECAL POLYPS COLD SNARE EXCISED.     Scattered diverticula were found in the descending colon.  A nodule     was found. RECTAL NODULES CAUTERIZED.  This was otherwise a normal     examination of the colon.   Retroflexed views in the rectum     revealed hypertrophied anal papillae.    The scope was then     withdrawn from the patient and the procedure completed.           COMPLICATIONS:  None     ENDOSCOPIC IMPRESSION:     1) Polyps, multiple in the cecum     2) Diverticula, scattered in the  descending colon     3) Nodule     4) Otherwise normal examination     5) Hypertrophied anal papillae     R/O ADENOMAS     RECOMMENDATIONS:     1) high fiber diet     2) Repeat Colonoscopy in 5 years.     REPEAT EXAM:  No           ______________________________     Vania Rea. Jarold Motto, MD, [facility]           CC:           n.     eSIGNED:   Vania Rea. Gracilyn Gunia at 09/16/2009 09:49 AM           Starling Manns, [id number]  Note: An exclamation mark (!) indicates a result that  was not dispersed into the flowsheet. Document Creation Date: 09/16/2009 9:49 AM _______________________________________________________________________  (1) Order result status: Final Collection or observation date-time: 09/16/2009 09:38 Requested date-time:  Receipt date-time:  Reported date-time:  Referring Physician:   Ordering Physician: Sheryn Bison [phone]) Specimen Source:  Source: Launa Grill Order Number: [id number] Lab site:   Appended Document: Colonoscopy 5y f/u  Appended Document: Colonoscopy     Procedures Next Due Date:    Colonoscopy: 09/2014

## 2010-02-09 NOTE — Assessment & Plan Note (Signed)
Summary: fu///kp   PCP:  Sherene Sires  Chief Complaint:  Follow-up.  History of Present Illness:  male smoker with hypertension hyperlipidemia and atypical chest pain with a left heart catheter showing 60% obstruction of the right-sided branches in the 1999 and a negative stress study in May of 2007.  He seen today after years absence for follow-up complaining of chest discomfort that is started abruptly while he was in the shower 4 days ago described as like a knife going through his chest into his spine acutely but then improved and is mostly positional in nature.  It is not worsened going up and down steps her with physical activity nor lying down or is related to swallowing is not associated nausea vomiting diaphoresis dyspnea or change with activity  eructation or flatulence or bowel movements.        Past Medical History:    Reviewed history and no changes required:               hypertension              Hyperlipidemia paragraph moderate obesity              Chronic rhinitis          Family History:    Reviewed history and no changes required:       hypertension in mother and father number premature heart disease in any direct relatives nor stroke diabetes or cancer, except that his mother has developed leukemia  Social History:    Reviewed history and no changes required:       quit smoking about 1990 denies alcohol use.  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     Vital Signs:  Patient Profile:   55 Years Old Male Weight:       207.4 pounds O2 Sat:      96 % O2 treatment:    Room Air Temp:     98.1 degrees F oral Pulse rate:   60 / minute BP sitting:   120 / 78  (right arm)  Vitals Entered By: Michel Bickers CMA (March 06, 2007 11:39 AM)             Comments Medications reviewed. Pt needs RX for Metoprolol.     Physical Exam    Ambulatory healthy appearing in no acute distress. Afeb with normal vital signs HEENT: nl dentition, turbinates, and orophanx. Nl external ear canals without cough reflex Neck without JVD/Nodes/TM Lungs clear to A and P bilaterally without cough on insp or exp maneuvers RRR no s3 or murmur or increase in P2 Abd soft and benign with nl excursion in the supine position. No bruits or organomegaly Ext warm without calf tenderness, cyanosis clubbing or edema Skin warm and dry without lesions       Impression & Recommendations:  Problem # 1:  CHEST PAIN (ICD-786.50)  this patient actually has a history of atypical chest pain dating back 10 years with negative cardiac workup in 1999 and then again in May of 2007.  I suspect it is either related to reflux or musculoskeletal in nature.  This particular discomfort seems more positional than anything  else and therefore I recommended a trial of Advil taken two to 4 with meals p.r.n..  I did describe very specifically what to look for for cardiac or pulmonary causes of chest discomfort and to let me know if any of them develop or if the pain does not respond Advil.  Orders: T-2 View CXR (71020TC) Est. Patient Level IV ([address])   Problem # 2:  HYPERTENSION, BENIGN ESSENTIAL, CONTROLLED (ICD-401.1) hypertension is well controlled.  Did recommend a chest x-ray just to make sure he does not have any evidence of an aneurysm though clinically this is not likely The following medications were removed from the medication list:    Norvasc 5 Mg Tabs (Amlodipine besylate)  His updated medication list for this problem includes:    Diovan  Hct 160-12.5 Mg Tabs (Valsartan-hydrochlorothiazide)    Metoprolol Tartrate 50 Mg Tabs (Metoprolol tartrate) ..... One half two times a day  Orders: Est. Patient Level IV ([address])   Medications Added to Medication List This Visit: 1)  Metoprolol Tartrate 50 Mg Tabs (Metoprolol tartrate) .... One half two times a day 2)  Adult Aspirin Low Strength 81 Mg Tbdp (Aspirin) .Marland Kitchen.. 1 by mouth daily   Patient Instructions: 1)  Please schedule a follow-up appointment in 2 months CPX 2)  ok to use advil 200  2-4 with meals for aches and pains in joints    Prescriptions: METOPROLOL TARTRATE 50 MG TABS (METOPROLOL TARTRATE) one half two times a day  #30 x 11   Entered and Authorized by:   Nyoka Cowden MD   Signed by:   Nyoka Cowden MD on 03/06/2007   Method used:   [facility] ...       [id number]       [address]       [place], [place]  [address]       Ph: [phone] or [phone]       Fax: [phone]   RxID:   [id number]  ]

## 2010-02-09 NOTE — Assessment & Plan Note (Signed)
Summary: Primary svc/ f/u uri vs allergic rhinitis   Primary Provider/Referring Provider:  Sherene Sires  CC:  Acute visit.  Pt c/o facial pressure and low grade temp and sore throat x 2 days.  He has also had some prod cough with green sputum.Marland Kitchen  History of Present Illness: 14  yobm quit smoking  in early 1980's with hypertension hyperlipidemia and atypical chest pain with a left heart catheter showing 60% obstruction of the right-sided branches in the 1999 and a negative stress study in May of 2007.   November 20, 2007 ov to refill on vytorin with last ldl 101, no change in rx  July 17, 2008 cpx 2.8 miles power walk q am without difficulty  June 15, 2009 Acute visit.  Pt c/o facial pressure and low grade temp and sore throat x 2 days.  He has also had some prod cough with green sputum. happened after cleaning gutters with itching sneezing initially. Pt denies any significant sore throat, dysphagia, itching, sneezing,  ,  fever, chills, sweats, unintended wt loss, pleuritic or exertional cp, hempoptysis, change in activity tolerance  orthopnea pnd or leg swelling.  Current Medications (verified): 1)  Diovan Hct 160-12.5 Mg Tabs (Valsartan-Hydrochlorothiazide) .... One Daily 2)  Metoprolol Tartrate 50 Mg Tabs (Metoprolol Tartrate) .... One Half Two Times A Day 3)  Simvastatin 40 Mg Tabs (Simvastatin) .Marland Kitchen.. 1 By Mouth At Bedtime 4)  Adult Aspirin Low Strength 81 Mg  Tbdp (Aspirin) .Marland Kitchen.. 1 By Mouth Daily 5)  Lotrisone 1-0.05 %  Crea (Clotrimazole-Betamethasone) .... Apply Two Times A Day As Needed 6)  Advil 200 Mg Tabs (Ibuprofen) .... As Needed 7)  Advil Cold/sinus 30-200 Mg Tabs (Pseudoephedrine-Ibuprofen) .... As Directed Per Bottle As Needed 8)  Robitussin Dm 100-10 Mg/46ml Syrp (Dextromethorphan-Guaifenesin) .... Per Bottle Instructions As Needed 9)  Tramadol Hcl 50 Mg  Tabs (Tramadol Hcl) .... One   Every 4-6 Hours For Excess Cough or Pain  Allergies (verified): 1)  ! Codeine  Past  History:  Past Medical History: HYPERTENSION, BENIGN ESSENTIAL, CONTROLLED (ICD-401.1) OBESITY    - Target wt  = 190   for BMI < 30  Colon polyps....................................................................................................[name]     - colonoscopy 4/04, letter sent 05/15/07 reviewed with pt July 17, 2008  CHEST PAIN (ICD-786.50)      - IHD with 60% obstruction 1999 RCA negative stress Cardiolite May 2007 HYPERLIPIDEMIA      - target LDL less than 70 based on documented ischemic heart disease HEALTH MAINTENANCE..........................................................................................[name]      - TD  April 2009      - CPX  July 17, 2008   Vital Signs:  Patient profile:   55 year old male Weight:      198 pounds O2 Sat:      98 % on Room air Temp:     97.1 degrees F oral Pulse rate:   45 / minute BP sitting:   110 / 70  (left arm)  Vitals Entered By: Vernie Murders (June 15, 2009 10:21 AM)  O2 Flow:  Room air  Physical Exam  Additional Exam:  Ambulatory healthy appearing black male in no acute distress. wt  201 November 20, 2007  > 205 April 18, 2008 > 201 July 17, 2008 > 206 January 14, 2009 > 198 June 15, 2009  HEENT: nl dentition,  and orophanx. Severe tubinate edema R > L with eythema, no def purulent secretions or polyps Neck without  JVD/Nodes/TM. carotid upstrokes brisk without bruits Lungs clear to A and P bilaterally without cough on insp or exp maneuvers RRR no s3 or murmur or increase in P2. no edema Abd soft and benign with nl excursion in the supine position.  Ext warm without calf tenderness, cyanosis clubbing    Impression & Recommendations:  Problem # 1:  ALLERGIC RHINITIS, CHRONIC (ICD-477.9)  Acute flare. rx reviewed. See instructions for specific recommendations   Orders: Est. Patient Level III ([address])  Problem # 2:  HYPERTENSION (ICD-401.9)  may be over beta blocked so reduce to metaprolol one half  daily  His updated medication list for this problem includes:    Diovan Hct 160-12.5 Mg Tabs (Valsartan-hydrochlorothiazide) ..... One daily    Metoprolol Tartrate 50 Mg Tabs (Metoprolol tartrate) ..... One half daily  Orders: Est. Patient Level III ([address])  Medications Added to Medication List This Visit: 1)  Metoprolol Tartrate 50 Mg Tabs (Metoprolol tartrate) .... One half daily 2)  Prednisone 10 Mg Tabs (Prednisone) .... 4 each am x 2days, 2x2days, 1x2days and stop 3)  Ceftin 250 Mg Tabs (Cefuroxime axetil) .... By mouth twice daily  Patient Instructions: 1)  Ceftin 250 twice daily x 10days 2)  Prednisone 10 mg 4 each am x 2days, 2x2days, 1x2days and stop 3)  Clariton as needed for itching and sneezing 4)  Please schedule a follow-up appointment in 6 weeks for CPX 5)  ADD:  REDUCE METAPROLOL TO 25 MG ONE HALF DAILY Prescriptions: CEFTIN 250 MG  TABS (CEFUROXIME AXETIL) By mouth twice daily  #20 x 0   Entered and Authorized by:   Nyoka Cowden MD   Signed by:   Nyoka Cowden MD on 06/15/2009   Method used:   Electronically to        [id number] (retail)       [address]       [place], [place]  [address]       Ph: [phone] or [phone]       Fax: [phone]   RxID:   [id number] PREDNISONE 10 MG  TABS (PREDNISONE) 4 each am x 2days, 2x2days, 1x2days and stop  #14 x 0   Entered and Authorized by:   Nyoka Cowden MD   Signed by:   Nyoka Cowden MD on 06/15/2009   Method used:   Electronically to        [id number] (retail)       [address]       [place], [place]  [address]       Ph: [phone] or [phone]       Fax: [phone]   RxID:   [id number]   Appended Document: Primary svc/ f/u uri vs allergic rhinitis SEE ADD INSTRUCTIONS AND CALL PT WITH THEM  Appended Document: Primary svc/ f/u uri vs allergic rhinitis [id number]  Appended Document: Primary svc/ f/u uri vs allergic rhinitis Pt is aware and had verbal understanding to  reduce metoprolol to 25mg  1/2 daily.

## 2010-02-09 NOTE — Progress Notes (Signed)
Summary: out of bp meds  Medications Added DIOVAN HCT 160-12.5 MG TABS (VALSARTAN-HYDROCHLOROTHIAZIDE)  LIPITOR 10 MG TABS (ATORVASTATIN CALCIUM)  METOPROLOL TARTRATE 50 MG TABS (METOPROLOL TARTRATE)  NORVASC 5 MG TABS (AMLODIPINE BESYLATE)  VYTORIN 10-20 MG TABS (EZETIMIBE-SIMVASTATIN)        Phone Note Call from Patient   Caller: Patient Call For: [name] [name] Summary of Call: pt is completely out of bp meds. has made an appt w/ [name] [name] for 2/24, but wants to know what he's to do in the meantime. [facility] [address]  Patient's chart has been requested. Initial call taken by: Tivis Ringer,  February 23, 2007 4:11 PM  Follow-up for Phone Call        lmtcb .................................................................Marland KitchenMarland KitchenVernie Murders  February 23, 2007 4:20 PM ltcb ..................................................................Marland KitchenMarinus Maw  February 26, 2007 11:24 AM   Additional Follow-up for Phone Call Additional follow up Details #1::        Pt called back - out of metoprolol - pt aware I will fill enough until next visit on [facility] [address] Additional Follow-up by: Marinus Maw,  February 26, 2007 2:36 PM    New/Updated Medications: DIOVAN HCT 160-12.5 MG TABS (VALSARTAN-HYDROCHLOROTHIAZIDE)  LIPITOR 10 MG TABS (ATORVASTATIN CALCIUM)  METOPROLOL TARTRATE 50 MG TABS (METOPROLOL TARTRATE)  NORVASC 5 MG TABS (AMLODIPINE BESYLATE)  VYTORIN 10-20 MG TABS (EZETIMIBE-SIMVASTATIN)    Prescriptions: METOPROLOL TARTRATE 50 MG TABS (METOPROLOL TARTRATE)   #30 x 0   Entered by:   Marinus Maw   Authorized by:   Nyoka Cowden MD   Signed by:   Marinus Maw on 02/26/2007   Method used:   [facility] to ...       [id number]       [address]       [place], [place]  [address]       Ph: [phone] or [phone]       Fax: [phone]   RxID:   [id number] LIPITOR 10 MG TABS  (ATORVASTATIN CALCIUM)   #30 x 0   Entered by:   Marinus Maw   Authorized by:   Nyoka Cowden MD   Signed by:   Marinus Maw on 02/26/2007   Method used:   [facility]ronically sent to ...       [id number]       [address]       [place], [place]  [address]       Ph: [phone] or [phone]       Fax: [phone]   RxID:   [id number]  Cancel

## 2010-02-09 NOTE — Assessment & Plan Note (Signed)
Summary: Primary svc/ cpx    Primary Provider/Referring Provider:  Sherene Sires  CC:  cpx fasting.  History of Present Illness: 85  yobm quit smoking  in early 1980's with hypertension hyperlipidemia and atypical chest pain with a left heart catheter showing 60% obstruction of the right-sided branches in the 1999 and a negative stress study in May of 2007.   November 20, 2007 ov to refill on vytorin with last ldl 101, no change in rx  July 17, 2008 cpx 2.8 miles power walk q am without difficulty  June 15, 2009 Acute visit.  Pt c/o facial pressure and low grade temp and sore throat x 2 days.  He has also had some prod cough with green sputum. happened after cleaning gutters with itching sneezing initially.  rec ceftin, pred x 6, clariton, and reduce metaprolol to one half daily  August 03, 2009 cpx, no new complaints, having some success with wt loss. Pt denies any significant sore throat, dysphagia, itching, sneezing,  nasal congestion or excess secretions,  fever, chills, sweats, unintended wt loss, pleuritic or exertional cp, hempoptysis, change in activity tolerance  orthopnea pnd or leg swelling   Current Medications (verified): 1)  Diovan Hct 160-12.5 Mg Tabs (Valsartan-Hydrochlorothiazide) .... One Daily 2)  Metoprolol Tartrate 50 Mg Tabs (Metoprolol Tartrate) .... One Half Daily 3)  Simvastatin 40 Mg Tabs (Simvastatin) .Marland Kitchen.. 1 By Mouth At Bedtime 4)  Adult Aspirin Low Strength 81 Mg  Tbdp (Aspirin) .Marland Kitchen.. 1 By Mouth Daily 5)  Lotrisone 1-0.05 %  Crea (Clotrimazole-Betamethasone) .... Apply Two Times A Day As Needed 6)  Advil 200 Mg Tabs (Ibuprofen) .... As Needed 7)  Advil Cold/sinus 30-200 Mg Tabs (Pseudoephedrine-Ibuprofen) .... As Directed Per Bottle As Needed 8)  Robitussin Dm 100-10 Mg/59ml Syrp (Dextromethorphan-Guaifenesin) .... Per Bottle Instructions As Needed 9)  Tramadol Hcl 50 Mg  Tabs (Tramadol Hcl) .... One   Every 4-6 Hours For Excess Cough or Pain  Allergies (verified): 1)  !  Codeine  Past History:  Past Medical History: HYPERTENSION, BENIGN ESSENTIAL, CONTROLLED (ICD-401.1) OBESITY    - Target wt  = 190   for BMI < 30  Colon polyps....................................................................................................[name]     - colonoscopy 4/04, letter sent 05/15/07 reviewed with pt July 17, 2008  and August 03, 2009 > referred back to GI CHEST PAIN (ICD-786.50)      - IHD with 60% obstruction 1999 RCA negative stress Cardiolite May 2007 HYPERLIPIDEMIA      - target LDL less than 70 based on documented ischemic heart disease HEALTH MAINTENANCE..........................................................................................[name]      - TD  April 2009      - CPX  August 03, 2009   Family History: hypertension in mother and father no premature heart disease in any direct relatives nor stroke diabetes or cancer, except that his mother has developed leukemia 1 brother healthy  Social History: quit smoking about 1983  denies alcohol use.  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 wt  201 November 20, 2007  > 205 April 18, 2008 > 201 July 17, 2008 > 206 January 14, 2009 > 198 June 15, 2009 > 192 August 03, 2009  HEENT: nl dentition,  and orophanx. Severe tubinate edema R > L with eythema, no def purulent secretions or polyps Neck without JVD/Nodes/TM. carotid upstrokes brisk without bruits Lungs clear to A and P bilaterally without cough on insp or exp maneuvers RRR no s3 or murmur or increase in P2. no edema Abd soft and benign with nl excursion in the  supine position.  Ext warm without calf tenderness, cyanosis clubbing PT >> DP bilaterally Uncirc, testes down bilat, no ih Rectal mild bph, smooth texture, stool G neg   Cholesterol               128 mg/dL                   1-610     ATP III Classification            Desirable:  < 200 mg/dL                    Borderline High:  200 - 239 mg/dL               High:  > = 240 mg/dL   Triglycerides             75.0 mg/dL                  9.6-045.4     Normal:  <150 mg/dL     Borderline High:  098 - 199 mg/dL   HDL                  [L]  11.91 mg/dL                 >47.82   VLDL Cholesterol          15.0 mg/dL                  9.5-62.1   LDL Cholesterol           81 mg/dL                    3-08  CHO/HDL Ratio:  CHD Risk                             4                    Men          Women     1/2 Average Risk     3.4          3.3     Average Risk          5.0          4.4     2X Average Risk          9.6          7.1     3X Average Risk          15.0          11.0                           Tests: (2)  BMP (METABOL)   Sodium                    136 mEq/L                   135-145   Potassium                 4.0 mEq/L                   3.5-5.1   Chloride                  100 mEq/L                   96-112   Carbon Dioxide            30 mEq/L                    19-32   Glucose              [H]  112 mg/dL                   16-10   BUN                       20 mg/dL                    9-60   Creatinine                0.9 mg/dL                   4.5-4.0   Calcium                   9.3 mg/dL                   9.8-11.9   GFR                       111.25 mL/min               >60  Tests: (3) CBC Platelet w/Diff (CBCD)   White Cell Count          5.6 K/uL                    4.5-10.5   Red Cell Count       [L]  4.19 Mil/uL                 4.22-5.81   Hemoglobin                13.4 g/dL                   14.7-82.9   Hematocrit           [L]  38.2 %                      39.0-52.0   MCV                        91.1 fl                     78.0-100.0   MCHC                      35.1 g/dL  30.0-36.0   RDW                       13.1 %                      11.5-14.6   Platelet Count            228.0 K/uL                  150.0-400.0   Neutrophil %              64.1 %                      43.0-77.0   Lymphocyte %              29.7 %                      12.0-46.0   Monocyte %                4.0 %                       3.0-12.0   Eosinophils%              1.2 %                       0.0-5.0   Basophils %               1.0 %                       0.0-3.0   Neutrophill Absolute      3.6 K/uL                    1.4-7.7   Lymphocyte Absolute       1.7 K/uL                    0.7-4.0   Monocyte Absolute         0.2 K/uL                    0.1-1.0  Eosinophils, Absolute                             0.1 K/uL                    0.0-0.7   Basophils Absolute        0.1 K/uL                    0.0-0.1  Tests: (4) Hepatic/Liver Function Panel (HEPATIC)   Total Bilirubin           0.6 mg/dL                   1.6-1.0   Direct Bilirubin          0.1 mg/dL                   9.6-0.4   Alkaline Phosphatase      42 U/L                      39-117   AST                  [  H]  40 U/L                      0-37   ALT                       41 U/L                      0-53   Total Protein             7.4 g/dL                    9.6-2.9   Albumin                   4.7 g/dL                    5.2-8.4  Tests: (5) TSH (TSH)   FastTSH                   0.97 uIU/mL                 0.35-5.50  Tests: (6) Prostate Specific Antigen (PSA)   PSA-Hyb                   0.52 ng/mL                  0.10-4.00   Color                     LT. YELLOW       RANGE:  Yellow;Lt. Yellow   Clarity                   CLEAR                       Clear   Specific Gravity          1.020                       1.000 - 1.030   Urine Ph                  7.0                         5.0-8.0   Protein                   NEGATIVE                     Negative   Urine Glucose             NEGATIVE                    Negative   Ketones                   NEGATIVE                    Negative   Urine Bilirubin           NEGATIVE                    Negative   Blood                     NEGATIVE  Negative   Urobilinogen              0.2                         0.0 - 1.0   Leukocyte Esterace        NEGATIVE                    Negative   Nitrite                   NEGATIVE                    Negative  Impression & Recommendations:  Problem # 1:  HYPERTENSION (ICD-401.9) ok on rx  His updated medication list for this problem includes:    Diovan Hct 160-12.5 Mg Tabs (Valsartan-hydrochlorothiazide) ..... One daily    Metoprolol Tartrate 50 Mg Tabs (Metoprolol tartrate) ..... One half daily  Problem # 2:  DYSLIPIDEMIA (ICD-272.4)  His updated medication list for this problem includes:    Simvastatin 40 Mg Tabs (Simvastatin) .Marland Kitchen... 1 by mouth at bedtime  Labs Reviewed: SGOT: 41 (07/17/2008)   SGPT: 43 (07/17/2008)   HDL:32.70 (07/17/2008), 28.9 (05/09/2007)  LDL:76 (07/17/2008), 101 (04/54/0981)  LDL 81 August 03, 2009   Chol:124 (07/17/2008), 146 (05/09/2007)  Trig:77.0 (07/17/2008), 79 (05/09/2007)  Problem # 3:  COLONIC POLYPS (ICD-211.3) referred back to GI  Other Orders: EKG w/ Interpretation (93000) Est. Patient 40-64 years (19147) T-2 View CXR (71020TC) Gastroenterology Referral (GI) TLB-Udip ONLY (81003-UDIP) TLB-Lipid Panel (80061-LIPID) TLB-BMP (Basic Metabolic Panel-BMET) (80048-METABOL) TLB-CBC Platelet - w/Differential (85025-CBCD) TLB-Hepatic/Liver Function Pnl (80076-HEPATIC) TLB-TSH (Thyroid Stimulating Hormone) (84443-TSH) TLB-PSA (Prostate Specific Antigen) (84153-PSA)  Patient Instructions: 1)  Call [phone] for your results w/in next 3 days - if there's something important  I feel you need to know,  I'll be in touch with you directly. 2)  See Patient Care Coordinator before leaving for  colonoscopy 3)  Return to office in 6  months, sooner if needed    [facility] ECG  ID: [id number] Patient: [name] [name], [name] [name] DOB: 01-28-1955 Age: 55 Years Old Sex: Male Race: Black Height: 67 Weight: 192 Status: Unconfirmed Past Medical History:  HYPERTENSION, BENIGN ESSENTIAL, CONTROLLED (ICD-401.1) OBESITY    - Target wt  = 190   for BMI < 30  Colon polyps....................................................................................................[name]     - colonoscopy 4/04, letter sent 05/15/07 reviewed with pt July 17, 2008  CHEST PAIN (ICD-786.50)      - IHD with 60% obstruction 1999 RCA negative stress Cardiolite May 2007 HYPERLIPIDEMIA      - target LDL less than 70 based on documented ischemic heart disease HEALTH MAINTENANCE..........................................................................................[name]      - TD  April 2009      - CPX  July 17, 2008  Recorded: 08/03/2009 09:09 AM P/PR: 108 ms / 167 ms - Heart rate (maximum exercise) QRS: 91 QT/QTc/QTd: 416 ms / 412 ms / 83 ms - Heart rate (maximum exercise)  P/QRS/T axis: 50 deg / 62 deg / 38 deg - Heart rate (maximum exercise)  Heartrate: 58 bpm  Interpretation:   sinus rhythm (slow)  septal infarct   Q > 40 ms in V2   Q/R > 1/3 in V2   Abnormal ECG

## 2010-02-09 NOTE — Progress Notes (Signed)
Summary: appt?  Phone Note Call from Patient Call back at [phone]   Caller: Patient Call For: wert Reason for Call: Talk to Nurse Summary of Call: sore throat, turned in to sinus trouble, low grade fever, 100.1, taking otc stuff he has told him ok to take, still feels lousy.  Please advise. Initial call taken by: Eugene Gavia,  June 15, 2009 8:33 AM  Follow-up for Phone Call        Spoke with pt.  Pt states on Thursday he developed sore throat and since then it has progressed to sinuses.  Started having sinuse pressure yesterday with low grade fever of 100.1, chills, and sweets.  Light green mucus this am.  Has tried Advil Cold and Sinus with no relief.  Pt ok to come in today.  OV scheduled with MW for this am at 1020.  Pt aware.  Gweneth Dimitri RN  June 15, 2009 9:01 AM

## 2010-02-09 NOTE — Assessment & Plan Note (Signed)
Summary: Primary svc/ developing cyclical cough   Primary Provider/Referring Provider:  Sherene Sires  CC:  Acute visit.  Pt c/o cough and chest congestion x 5 days.  He is coughing up very little sputum that is clear is color.  .  History of Present Illness: 9 yobm quit smoking  in early 1980's with hypertension hyperlipidemia and atypical chest pain with a left heart catheter showing 60% obstruction of the right-sided branches in the 1999 and a negative stress study in May of 2007.   November 20, 2007 ov to refill on vytorin with last ldl 101, no change in rx  April 18, 2008 ov for refill on bp meds, not aerobically active but no ex cp, sob.  July 17, 2008 cpx 2.8 miles power walk q am without difficulty  January 14, 2009 Acute visit.  Pt c/o cough and chest congestion x 5 days.  He is coughing up very little sputum that is clear is color. Pt denies any significant sore throat, dysphagia, itching, sneezing,  nasal congestion or excess secretions,  fever, chills, sweats, unintended wt loss, pleuritic or exertional cp, hempoptysis, change in activity tolerance  orthopnea pnd or leg swelling .  Current Medications (verified): 1)  Diovan Hct 160-12.5 Mg Tabs (Valsartan-Hydrochlorothiazide) .... One Daily 2)  Metoprolol Tartrate 50 Mg Tabs (Metoprolol Tartrate) .... One Half Two Times A Day 3)  Vytorin 10-20 Mg Tabs (Ezetimibe-Simvastatin) .Marland Kitchen.. 1 Once Daily 4)  Adult Aspirin Low Strength 81 Mg  Tbdp (Aspirin) .Marland Kitchen.. 1 By Mouth Daily 5)  Lotrisone 1-0.05 %  Crea (Clotrimazole-Betamethasone) .... Apply Two Times A Day As Needed 6)  Advil 200 Mg Tabs (Ibuprofen) .... As Needed 7)  Advil Cold/sinus 30-200 Mg Tabs (Pseudoephedrine-Ibuprofen) .... As Directed Per Bottle As Needed 8)  Robitussin Dm 100-10 Mg/16ml Syrp (Dextromethorphan-Guaifenesin) .... Per Bottle Instructions As Needed  Allergies (verified): 1)  ! Codeine  Past History:  Past Medical History: HYPERTENSION, BENIGN ESSENTIAL, CONTROLLED  (ICD-401.1) OBESITY    - Target wt  = 190   for BMI < 30  Colon polyps....................................................................................................[name]     - colonoscopy 4/04, letter sent 05/15/07 reviewed with pt July 17, 2008  CHEST PAIN (ICD-786.50)      - IHD with 60% obstruction 1999 RCA negative stress Cardiolite May 2007 HYPERLIPIDEMIA      - target LDL less than 70 based on documented ischemic heart disease HEALTH MAINTENANCE........................................................................................[name]      - TD  April 2009      - CPX  July 17, 2008   Vital Signs:  Patient profile:   55 year old male Weight:      206 pounds BMI:     32.38 O2 Sat:      95 % on Room air Temp:     98.1 degrees F oral Pulse rate:   61 / minute BP sitting:   116 / 80  (left arm)  Vitals Entered By: Vernie Murders (January 14, 2009 10:28 AM)  O2 Flow:  Room air  Physical Exam  Additional Exam:  Ambulatory healthy appearing black male in no acute distress. Afeb with normal vital signs  wt 202 > 201 November 20, 2007  > 205 April 18, 2008 > 201 July 17, 2008 > 206 January 14, 2009  HEENT: nl dentition, turbinates, and orophanx. Neck without JVD/Nodes/TM. carotid upstrokes brisk without bruits Lungs clear to A and P bilaterally without cough on insp or exp maneuvers RRR no  s3 or murmur or increase in P2. no edema Abd soft and benign with nl excursion in the supine position.  Ext warm without calf tenderness, cyanosis clubbing    Impression & Recommendations:  Problem # 1:  COUGH (ICD-786.2)  Explained natural h/o uri and why it's necessary in patients at risk to rx short term with PPI to reduce risk of evolving cyclical cough triggered by epithelial injury and a heightened sensitivty to the effects of any upper airway irritants,  most importantly acid - related    Each maintenance medication was reviewed in detail including most importantly the  difference between maintenance and as needed and under what circumstances the prns are to be used. See instructions for specific recommendations   Orders: Est. Patient Level III (84132)  Medications Added to Medication List This Visit: 1)  Advil Cold/sinus 30-200 Mg Tabs (Pseudoephedrine-ibuprofen) .... As directed per bottle as needed 2)  Robitussin Dm 100-10 Mg/31ml Syrp (Dextromethorphan-guaifenesin) .... Per bottle instructions as needed 3)  Prednisone 10 Mg Tabs (Prednisone) .... 4 each am x 2days, 2x2days, 1x2days and stop 4)  Tramadol Hcl 50 Mg Tabs (Tramadol hcl) .... One   every 4-6 hours for excess cough or pain  Patient Instructions: 1)  Acid reflux is the leading suspect here and needs to be eliminated  completely before considering additional studies or treatment options. To suppress this maximally take prilosec 20 mg before first mead and  pepcid 20 mg (otc) at bedtime plus diet measures as listed just until cough gone 2)  Prednisone 4 each am x 2days, 2x2days, 1x2days and stop  3)  Take delsym two tsp every 12 hours and add tramadol 50 mg up to every 4 hours to suppress the urge to cough. Swallowing water or using ice chips/non mint and menthol containing candies (such as lifesavers or sugarless jolly ranchers) are also effective.  4)  GERD (REFLUX)  is a common cause of respiratory symptoms. It commonly presents without heartburn and can be treated with medication, but also with lifestyle changes including avoidance of late meals, excessive alcohol, smoking cessation, and avoid fatty foods, chocolate, peppermint, colas, red wine, and acidic juices such as orange juice. NO MINT OR MENTHOL PRODUCTS SO NO COUGH DROPS  5)  USE SUGARLESS CANDY INSTEAD (jolley ranchers)  6)  NO OIL BASED VITAMINS  Prescriptions: TRAMADOL HCL 50 MG  TABS (TRAMADOL HCL) One   every 4-6 hours for excess cough or pain  #40 x 0   Entered and Authorized by:   Nyoka Cowden MD   Signed by:   Nyoka Cowden MD  on 01/14/2009   Method used:   Electronically to        [id number] (ret[facility])       [address]       [place], [place]  [address]       Ph: [phone] or [phone]       Fax: [phone]   RxID:   [id number] PREDNISONE 10 MG  TABS (PREDNISONE) 4 each am x 2days, 2x2days, 1x2days and stop  #14 x 0   Entered and Authorized by:   Nyoka Cowden MD   Signed by:   Nyoka Cowden MD on 01/14/2009   Method used:   Electronically to        [id number] (retail)       [address]       [place], [place]  [address]  Ph: [phone] or [phone]       Fax: [phone]   RxID:   [id number]

## 2010-02-10 ENCOUNTER — Encounter: Payer: Self-pay | Admitting: Internal Medicine

## 2010-02-10 ENCOUNTER — Ambulatory Visit (INDEPENDENT_AMBULATORY_CARE_PROVIDER_SITE_OTHER): Payer: BC Managed Care – PPO | Admitting: Internal Medicine

## 2010-02-10 DIAGNOSIS — R059 Cough, unspecified: Secondary | ICD-10-CM

## 2010-02-10 DIAGNOSIS — R05 Cough: Secondary | ICD-10-CM

## 2010-02-17 NOTE — Assessment & Plan Note (Signed)
Summary: Primary svc/ acute eval for severe cough   Vital Signs:  Patient profile:   55 year old male Weight:      197 pounds BMI:     30.97 O2 Sat:      98 % on Room air Temp:     98.0 degrees F oral Pulse rate:   70 / minute BP sitting:   130 / 80  (left arm)  Vitals Entered By: Vernie Murders (February 10, 2010 11:21 AM)  O2 Flow:  Room air  Primary Provider/Referring Provider:  Sherene Sires  CC:  PND and head congestion.  History of Present Illness: 54 yobm quit smoking  in early 1980's with hypertension hyperlipidemia and atypical chest pain with a left heart catheter showing 60% obstruction of the right-sided branches in the 1999 and a negative stress study in May of 2007.   November 20, 2007 ov to refill on vytorin with last ldl 101, no change in rx  July 17, 2008 cpx 2.8 miles power walk q am without difficulty  June 15, 2009 Acute visit.  Pt c/o facial pressure and low grade temp and sore throat x 2 days.  He has also had some prod cough with green sputum. happened after cleaning gutters with itching sneezing initially.  rec ceftin, pred x 6, clariton, and reduce metaprolol to one half daily  August 03, 2009 cpx, no new complaints, rec colonoscopy> neg  February 10, 2010 ov cc head and chest congestion > mucus white x 3 days x advil cold and sinus with severe cough to point of gag and scatchy throat. no sob. Pt denies any significant  dysphagia, itching, sneezing,  ,  fever, chills, sweats, unintended wt loss, pleuritic or exertional cp, hempoptysis, change in activity tolerance  orthopnea pnd or leg swelling. Pt also denies any obvious fluctuation in symptoms with weather or environmental change or other alleviating or aggravating factors.       Current Medications (verified): 1)  Diovan Hct 160-12.5 Mg Tabs (Valsartan-Hydrochlorothiazide) .... One Daily 2)  Metoprolol Tartrate 50 Mg Tabs (Metoprolol Tartrate) .... One Half Daily 3)  Simvastatin 40 Mg Tabs (Simvastatin) .Marland Kitchen.. 1  By Mouth At Bedtime 4)  Adult Aspirin Low Strength 81 Mg  Tbdp (Aspirin) .Marland Kitchen.. 1 By Mouth Daily 5)  Lotrisone 1-0.05 %  Crea (Clotrimazole-Betamethasone) .... Apply Two Times A Day As Needed 6)  Advil 200 Mg Tabs (Ibuprofen) .... As Needed 7)  Advil Cold/sinus 30-200 Mg Tabs (Pseudoephedrine-Ibuprofen) .... As Directed Per Bottle As Needed 8)  Robitussin Dm 100-10 Mg/80ml Syrp (Dextromethorphan-Guaifenesin) .... Per Bottle Instructions As Needed 9)  Tramadol Hcl 50 Mg  Tabs (Tramadol Hcl) .... One   Every 4-6 Hours For Excess Cough or Pain 10)  Ocean Nasal Spray 0.65 % Soln (Saline) .... As Directed Per Box As Needed  Allergies (verified): 1)  ! Codeine  Past History:  Past Medical History: HYPERTENSION, BENIGN ESSENTIAL, CONTROLLED (ICD-401.1) OBESITY    - Target wt  = 190   for BMI < 30  Colon polyps....................................................................................................Marland KitchenJarold Motto     - colonoscopy 4/04     - Repeat 09/16/2009 benign polyps, recall due 5 years CHEST PAIN (ICD-786.50)      - IHD with 60% obstruction 1999 RCA negative stress Cardiolite May 2007 HYPERLIPIDEMIA      - target LDL less than 70 based on documented ischemic heart disease HEALTH MAINTENANCE................................................................................[name]      - TD  April 2009      -  CPX  August 03, 2009   Physical Exam  Additional Exam:  Ambulatory healthy appearing black male in no acute distress. wt  201 November 20, 2007   > 198 June 15, 2009 > 192 August 03, 2009 >  197 February 10, 2010  HEENT: nl dentition,  and orophanx. Severe tubinate edema R > L with eythema, no def purulent secretions or polyps Neck without JVD/Nodes/TM. carotid upstrokes brisk without bruits Lungs clear to A and P bilaterally without cough on insp or exp maneuvers RRR no s3 or murmur or increase in P2. no edema Abd soft and benign with nl excursion in the supine position.  Ext warm  without calf tenderness, cyanosis clubbing     Impression & Recommendations:  Problem # 1:  COUGH (ICD-786.2)  Explained natural h/o uri and why it's necessary in patients at risk to rx short term with PPI to reduce risk of evolving cyclical cough triggered by epithelial injury and a heightened sensitivty to the effects of any upper airway irritants,  most importantly acid - related    Each maintenance medication was reviewed in detail including most importantly the difference between maintenance and as needed and under what circumstances the prns are to be used. See instructions for specific recommendations   Orders: Est. Patient Level IV (29518)  Medications Added to Medication List This Visit: 1)  Robitussin Dm 100-10 Mg/31ml Syrp (Dextromethorphan-guaifenesin) .... Same as delsym so use one or the other 2)  Tramadol Hcl 50 Mg Tabs (Tramadol hcl) .... One - two every 4-6 hours for excess cough or pain 3)  Ocean Nasal Spray 0.65 % Soln (Saline) .... As directed per box as needed 4)  Doxycycline Hyclate 100 Mg Tabs (Doxycycline hyclate) .... One twice daily  Patient Instructions: 1)  Prilosec 30 min before bfast and pepcid 20 mg at bedtime as long as coughing ( reflux is to cough what oxygen is to fire)  2)  Take delsym two tsp every 12 hours and add tramadol 50 mg up to every 4 hours to suppress the urge to cough. Swallowing water or using ice chips/non mint and menthol containing candies (such as lifesavers or sugarless jolly ranchers) are also effective.  3)  if condition worsens fever or nasty mucus then take the doxycycline Prescriptions: DOXYCYCLINE HYCLATE 100 MG TABS (DOXYCYCLINE HYCLATE) one twice daily  #14 x 0   Entered and Authorized by:   Nyoka Cowden MD   Signed by:   Nyoka Cowden MD on 02/10/2010   Method used:   Print then Give to Patient   RxID:   [id number] TRAMADOL HCL 50 MG  TABS (TRAMADOL HCL) One - two every 4-6 hours for excess cough or pain  #40 x 0    Entered and Authorized by:   Nyoka Cowden MD   Signed by:   Nyoka Cowden MD on 02/10/2010   Method used:   Electronically to        [id number] ([facility])       [address]       [place], [place]  [address]       Ph: [phone] or [phone]       Fax: [phone]   RxID:   [id number]    Orders Added: 1)  Est. Patient Level IV [09381]

## 2010-02-17 NOTE — Letter (Signed)
Summary: [employer]  [address]   [place], [place] [address]   Phone: [phone]  Fax: [phone]    Today's Date: February 10, 2010  Name of Patient: [name] [name] [facility]  The above named patient had a medical visit today at: 11:15 am.  Please take this into consideration when reviewing the time away from work/school.    Special Instructions:  [  ] None  [ x ] To be off the remainder of today, returning to the normal work / school schedule tomorrow.  [  ] To be off until the next scheduled appointment on ______________________.  [  ] Other ________________________________________________________________ ________________________________________________________________________   Sincerely yours,     Sandrea Hughs, MD

## 2010-05-25 NOTE — Op Note (Signed)
NAME:  [name] [name], [name] [name]             ACCOUNT NO.:  [id number]   MEDICAL RECORD NO.:  [id number]          PATIENT TYPE:  AMB   LOCATION:  DAY                          FACILITY:  [facility]   PHYSICIAN:  Leonie Man, M.D.   DATE OF BIRTH:  Jan 24, 1955   DATE OF PROCEDURE:  10/19/2006  DATE OF DISCHARGE:                               OPERATIVE REPORT   PREOPERATIVE DIAGNOSIS:  Prolapsing internal hemorrhoids, grade 3.   POSTOPERATIVE DIAGNOSIS:  Prolapsing internal hemorrhoids, grade 3.   PROCEDURE:  Stapled hemorrhoidopexy.   SURGEON:  Leonie Man, M.D.   ASSISTANT:  OR nurse.   ANESTHESIA:  General.   SPECIMENS TO LAB:  Hemorrhoids.   ESTIMATED BLOOD LOSS:  Minimal.   COMPLICATIONS:  None apparent.  The patient returned to the PACU in  excellent condition.   [name] is a 55 year old man presenting with bleeding and persistent  prolapse of his hemorrhoids.  He wishes surgical treatment as much as  over-the-counter medications as well as prescription medications have  not caused any significant change in his hemorrhoidal disease.  He comes  to the operating room now after the risks and potential benefits of  surgery have been fully discussed.  All questions answered and consent  obtained for surgery.   PROCEDURE:  The patient is positioned in the prone jackknife position.  Following induction of satisfactory general anesthesia the perianal  tissues were prepped and draped to be included in a sterile operative  field.  The patient is identified as Special educational needs teacher.  The appropriate  antibiotics had been infused.  I infiltrated the perianal tissues with  0.25% Marcaine with epinephrine and Wydase.  I then dilated the rectum  up to three fingerbreadths.  I placed in the operating anoscope within  the rectum.  Approximately 5 cm above the dentate line I placed a 2-0  Prolene pursestring suture circumferentially around entire anorectal  orifice.  I tested the pursestring suture  to note that it was tight and  taut around the entire rectum.  I then placed the PPH stapling device  with the anvil above the suture line and tied the suture line taut  around the anvil.  The sutures were brought out through the stapling  device and the stapling device was lowered into the anus and rectum  tightening it down to assure that there is a tight taut suture line in  all directions around the area of the redundant mucosa.  This suture was  then held for 1 minute and then fired and held for an additional 30  seconds before removal.  On removal, a circumferential ring of the  mucosa was removed and forwarded for pathologic evaluation.  Additional  bleeding points along the suture line were treated with electrocautery.  We then noted additional hemorrhoidal complexes which involved both  internal and external hemorrhoids and these were suture ligated and  removed.  All areas hemostasis noted to be dry.  Sponge and instrument  counts verified.  I placed a Gelfoam roll over the suture line and  removed the operating anoscope.  Additional injections of 0.25% Marcaine were  used for additional  analgesia.  Sterile dressing was then applied.  The anesthetic reversed  and the patient removed from the operating room to the recovery room in  stable condition.  He tolerated the procedure well.      Leonie Man, M.D.  Electronically Signed     PB/MEDQ  D:  10/19/2006  T:  10/19/2006  Job:  [id number]

## 2010-05-25 NOTE — Letter (Signed)
August 10, 2006    Leonie Man, M.D.  [address]  [address]  [place], [place] [address]   RE:  [name], [name]  MRN:  [id number]  /  DOB:  01-20-1955   Dear Dennie Bible:   I would appreciate if you could see my patient, [name] [name], for  consideration of stapling of his prolapsing internal hemorrhoids.   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  He underwent colonoscopy in April, 2004 and had some small hyperplastic  polyps removed at that time.  He continued with very frequent rectal  bleeding despite local anal tear.   He does have mild essential hypertension and is on Lopressor 25 mg twice  daily, aspirin 81 mg a day, Vytorin 10/20 mg at bedtime, and Diovan  160/12.5 mg a day.  He uses p.r.n. Prilosec for acid reflux.   He is a healthy-appearing black male in no distress.  Appears his stated  age.  He weighs 197 pounds.  Blood pressure is 122/78.  Pulse was 68 and  regular.  Inspection of the rectum did show some prolapsing internal  hemorrhoids with some granularity and friability.  I could not  appreciate any rectal fissures or fistulae.   I think that Mr. Prowse is a good candidate for stapling of his  internal hemorrhoids.  In the interim, I have placed him on Canasa 1 gm  suppositories at bedtime on a p.r.n. basis.   Thank you for your time and help with this patient.    Sincerely,      Vania Rea. Jarold Motto, MD, Caleen Essex, FAGA  Electronically Signed    DRP/MedQ  DD: 08/10/2006  DT: 08/10/2006  Job #: [id number]   CC:   Leonie Man, M.D.  Charlaine Dalton. Sherene Sires, MD, FCCP

## 2010-05-28 NOTE — Consult Note (Signed)
NAME:  [name] [name], [name] [name]                       ACCOUNT NO.:  [id number]   MEDICAL RECORD NO.:  [id number]                   PATIENT TYPE:  EMS   LOCATION:  ED                                   FACILITY:  [facility]   PHYSICIAN:  Veneda Melter, M.D.                   DATE OF BIRTH:  Jan 05, 1956   DATE OF CONSULTATION:  03/26/2003  DATE OF DISCHARGE:                                   CONSULTATION   CHIEF COMPLAINT:  Chest pain.   HISTORY:  Mr. Dimond is a pleasant 55 year old black gentleman who  presents for assessment of chest discomfort. The patient has a history of  hypertension, dyslipidemia and congenital heart murmur who has been  extensively evaluated in the past with cardiac catheterization and  echocardiogram and stress imaging study.  For the past few days, he has had  two intermittent episodes of mild mid sternal chest discomfort of 1-2/10 and  this morning was associated with some mild right arm numbness.  This lasted  for approximately 30 minutes.  It was not quite bothersome but this patient  had arm discomfort and he felt that he should come in and be checked out.  He has not had any shortness of breath, orthopnea, paroxysmal nocturnal  dyspnea or lower extremity edema.  He has not had any fatigue, nausea,  vomiting, or diaphoresis.  He denies any change of symptoms with exertion or  position and he does not note any relieving factors.  He did have eggs and  sausage for breakfast this morning.  He describes his usual breakfast as  cheerios.   REVIEW OF SYMPTOMS:  Otherwise noncontributory.   PAST MEDICAL HISTORY:  Notable for hypertension, hyperlipidemia, history of  heart murmur, GI bleed secondary to polyps.  He has had EGD and colonoscopy  within the last year.  He has had cardiac catheterization June of 1999  showing no significant coronary disease and no LV dysfunction.  Stress test  in March of 2004 was reportedly negative   ALLERGIES:  CODEINE which cause  migraines.   CURRENT MEDICATIONS:  1. Norvasc 5 mg q.d.  2. Lopressor 50 mg b.i.d.  3. Aspirin 325 q.d.  4. Lipitor 10 mg q.d.  5. Citrucel one tablespoon every other day.   SOCIAL HISTORY:  The patient lives in [place] with his wife. He has three  daughters, works as a Marine scientist and has a history of tobacco use of  20 pack, quit 20 years ago, denies alcohol use.   FAMILY HISTORY:  Mother alive at the age of 74 with history of hypertension.  Father alive age of 82 with one brother age 57 without medical problems.   PHYSICAL EXAMINATION:  GENERAL:  He is a slightly overweight black gentleman  in no acute distress.  VITAL SIGNS:  Temperature is 98.0, pulse 31, respirations 18, blood pressure  149/99.  HEENT:  Pupils equal round and  reactive to light.  Extraocular movements  intact. Oropharynx shows no lesions.  NECK:  Supple.  No adenopathy, no bruits.  HEART:  Reveals a regular rate with a 2/6 systolic murmur in the left upper  sternal border.  LUNGS:  Clear to auscultation.  ABDOMEN:  Soft, nontender, no masses, no guarding, no rebound.  EXTREMITIES:  No edema. Peripheral pulses are 2+ and equal bilaterally.  Muscle strength is 5/5, sensory is intact to touch.   Chest x-ray shows no cardiomegaly, no infiltrates or effusions.  ECG is  normal sinus rhythm of 66.  T wave inversions noted in leads 3 and F but no  changes from prior tracings, no ST segment shifts.   LABORATORY DATA:  White count is 5.8, hemoglobin is 14.1, hematocrit 40.9,  platelets 242.  Sodium 139, potassium 4.1, chloride 105, bicarb 29.  BUN 15,  creatinine 0.8, glucose 91.  LFT's are within normal limits.  Lipase 28.  CK  is 237, MB is 2.3, troponin I is 0.01.  Telemetry monitoring since 2 p.m. to  9:30 p.m. shows no significant dysrhythmias.   ASSESSMENT/PLAN:  Mr. Treat is a 55 year old gentleman with atypical  chest discomfort whose undergone cardiac workup in the past which has been   negative.  This pain is not associated with exertion and he has no ECG  changes or cardiac enzymes, chest injury or ischemia.  He is at low risk for  acute cardiovascular events.  We will plan on treating the patient with  proton pump inhibitor on the chance he has some gastritis or peptic ulcer  disease and I have given the patient a prescription for nitroglycerin to try  should he have recurrence of symptoms. I will arrange for outpatient  assessment in the cardiology clinic in the next several days and will review  his recent studies including stress test and echocardiogram.  Should further  workup be indicated this will be pursued.  As it has been six years since  his last cardiac catheterization, repeat angiography may be indicated.   Other considerations include pulmonary embolus which I doubt as the patient  has not had any significant shortness of breath, nerve impingement or  musculoskeletal discomfort.  He could also have a hypertrophic  cardiomyopathy with discomfort from relative ischemia.  However, the patient  does not remember being told that he had thickened heart muscle.  Again we  will followup studies done in the office as an outpatient. The patient has  been instructed in the use of nitroglycerin and knows to return to the  emergency room should he have any recurrence of chest discomfort.                                               Veneda Melter, M.D.    NG/MEDQ  D:  03/26/2003  T:  03/29/2003  Job:  [id number]   cc:   Casimiro Needle B. Sherene Sires, M.D. [facility]   Maisie Fus C. Wall, M.D.

## 2010-05-28 NOTE — Consult Note (Signed)
NAME:  [name] [name]                       ACCOUNT NO.:  [id number]   MEDICAL RECORD NO.:  [id number]                   PATIENT TYPE:  EMS   LOCATION:  [facility]                                 FACILITY:  [facility]   PHYSICIAN:  Thomas C. Wall, M.D. [facility]            DATE OF BIRTH:  1955/06/07   DATE OF CONSULTATION:  DATE OF DISCHARGE:                                   CONSULTATION   REASON FOR CONSULTATION:  We are asked by the Gastroenterology Service to  evaluate [name] [name], a delightful 55 year old African-American, who  has some chest discomfort.   HISTORY:  He has a history of an aortic outflow murmur and is followed by  Dr. Sandrea Hughs.  He had a cardiac catheterization in June of 1999, which  showed no coronary artery disease and normal left ventricular function.  His  last stress test was in March of 2004, which he tells [place] Dr. Sherene Sires said was  okay.  He has not had a Cardiolite that he recalls.   He has been having some chest discomfort that seems to be worse with deep  breathing and also with movement.  He denies any exertional chest tightness,  pressure, or true symptoms of angina.   He does have significant risk factors in age, male sex, remote tobacco,  hyperlipidemia on Lipitor, and hypertension.   PAST MEDICAL HISTORY:  1. He has been having gastrointestinal bleeding secondary to polypectomy     several days ago.  He was seen in the emergency room today by the     Gastroenterology Service.  2. He has chronic back pain.  3. Left hip bursitis.   CURRENT MEDICATIONS:  1. Norvasc 5 mg a day.  2. Lopressor 50 mg p.o. b.i.d.  3. Lipitor 10 mg a day.  4. Rhinocort spray.  5. No aspirin for the last ten days.   ALLERGIES:  He is intolerant to CODEINE which causes a migraine.   SOCIAL HISTORY:  He lives in [place] with his wife since 57.  He is a  Marine scientist at [employer].  He has three daughters who are alive and  well.   He does not drink and has  not since 1989.  He works out sporadically at [employer].  He quit smoking 20 years ago.   FAMILY HISTORY:  Significant for hypertension in his mother and father.   REVIEW OF SYSTEMS:  Unremarkable.   PHYSICAL EXAMINATION:  VITAL SIGNS:  Blood pressure 141/93 in the right arm  and 143/102 in the left arm, his pulse is 59, he is in sinus brady.  Respiratory rate is 18, he is afebrile, O2 saturations are adequate.  GENERAL:  He is in no acute distress.  He is slightly overweight.  HEENT:  Unremarkable.  NECK:  No JVD.  There is a left carotid bruit with good upstrokes  bilaterally.  No thyromegaly.  Trachea is midline.  LUNGS:  Clear with no rub.  CARDIAC:  Normal S1 and S2 without gallop.  He does have a 2/6 systolic  murmur but S2 does split.  ABDOMEN:  Good bowel sounds with no midline bruit and no tenderness.  There  is no hepatomegaly.  EXTREMITIES:  No cyanosis, clubbing or edema.  Pulses are intact.  NEUROLOGIC:  Exam is intact.   CHEST X-RAY:  Within normal limits.   EKG:  Sinus brady with no new changes.  He does have T-wave inversion with  ST segment flattening in III and aVF, which is old since January this year.   LABORATORY DATA:  CPK and troponin are negative x1.  The rest of his  laboratory data is unremarkable.  Hemoglobin is 14.5.   ASSESSMENT AND PLAN:  1. Chest discomfort in a man with multiple cardiac risk factors.  He has EKG     changes which seem to be stable since January.  However, we need to rule     out obstructive coronary artery disease.  Will arrange for a rest stress     exercise Cardiolite in the office next week as an outpatient.  2. Aortic stenosis murmur.  Arrange for 2-D echocardiogram at the time of     his stress test.  3. Left carotid bruit.  Will plan on doing carotid ultrasounds during his     visit in the office as well.   I have discussed the above with the patient and his wife.  They agree with  the plan.  I have told him not to overdo  it manually between now and the  time of his stress study.  I suspect that this will be negative.                                               Thomas C. Daleen Squibb, M.D. [facility]    TCW/MEDQ  D:  04/18/2002  T:  04/20/2002  Job:  [id number]   cc:   Thomas C. Daleen Squibb, M.D. [facility]   Casimiro Needle B. Sherene Sires, M.D. [facility]   Vania Rea. Jarold Motto, M.D. [facility]

## 2010-05-28 NOTE — Assessment & Plan Note (Signed)
[name]                               PULMONARY OFFICE NOTE   NAME:[name] [name], [name] [name]                    MRN:          [id number]  DATE:10/11/2005                            DOB:          April 07, 1955    HISTORY:  Patient is a 55 year old black male remote smoker with a history  of hypertension, hyperlipidemia and ischemic heart disease, status post most  recent laboratory catheterization in 1999 with a negative Cardiolite in May  2007.  He only had 60% obstruction of one of the right sided branches at  that time.  He has had no exertional chest pain, orthopnea, paroxysmal  nocturnal dyspnea, TIA or claudication.  It is of note, however, he has not  been consistently aerobically active.   PAST MEDICAL HISTORY:  1. Hypertension.  2. Hyperlipidemia with LDL less than 70 goal.  3. Ischemic heart disease as noted.  4. Left carotid bruit with negative arterial Doppler, April 2004.  5. Obesity with difficulty with weight control. Target weight less than      171.  6. Chronic rhinitis.  7. History of gastrointestinal bleed with benign colon polyps, status post      most recent colonoscopy April 2004 reviewed with the patient today.   ALLERGIES:  CODEINE causes nausea.   MEDICATIONS:  These are taken from the work sheet, corrected column listed  October 11, 2005.   SOCIAL HISTORY:  He quit smoking 18 years ago.  He denies any alcohol use.  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   FAMILY HISTORY:  Positive for hypertension in his mother and father.  Heart  disease in an uncle.  No premature heart disease in any direct relatives nor  stroke, diabetes, cancer of the prostate or colon.  His mother has developed  leukemia.   REVIEW OF SYSTEMS:  These are taken in detail and on the work sheet,  significant for the problems as outlined above.   PHYSICAL EXAMINATION:  GENERAL APPEARANCE:  This is a robust, pleasant,  ambulatory black male in no acute  distress, weighing 197 pounds with no  change in baseline.  VITAL SIGNS:  Blood pressure today is 130/84.  No change in his weight from  one year ago.  HEENT:  Ocular examination was done with limited funduscopy revealing no  significant reddened or arterious veins.  Ear canals clear bilaterally.  Dentition was intact.  Nasal turbinates normal.  NECK:  Supple without cervical adenopathy or tenderness.  Carotid upstrokes  did not illicit any bruits.  LUNGS:  Lung fields were perfectly clear bilaterally to auscultation and  percussion.  HEART:  There is a regular rate and rhythm without murmurs, rubs or gallops.  No displacement of PMI.  ABDOMEN:  Soft, benign, no palpable organomegaly, masses or tenderness.  No  bruits were present.  GENITOURINARY:  Testes descended bilaterally with no nodules.  He is  uncircumcised.  RECTAL:  Revealed mild benign prostatic hypertrophy, smooth texture, stool  Guaiac is negative.  EXTREMITIES: Warm without calf tenderness, cyanosis, clubbing or edema.  Pedal pulses were brisk, stronger posterior tibial  than dorsalis pedis  pulse.  SKIN:  Examination was unremarkable.  NEUROLOGICAL:  Completely intact.  MUSCULOSKELETAL:  Unremarkable.   CLINICAL DATA:  Chest x-ray showed minimal cardiomegaly, no infiltrates or  effusions.  Electrocardiogram revealed sinus rhythm with no ischemic  changes.   LABORATORY DATA:  Included a normal CRP and an LDL cholesterol of 80 with  HDL of 29.  PSA was only 0.52.  General health profile was normal including  TSH.   IMPRESSION:  1. Hypertension.  He is at goal on present combination therapy, which I      didn't change.  2. Hyperlipidemia.  He is at goal on present regimen, which I didn't      change. However, note that his HDL is not at target because of lack of      aerobic exercise.  I will notify him via phone tree of the      recommendation to exercise 30 minutes at a minimum, three times a week      to a level  where he is short of breath but not out of breath.  3. Minimal benign prostatic hypertrophy with normal PSA.  4. Ischemic heart disease with only 60% obstruction documented in 1999 and      negative stress Cardiolite in May 2007.  Nothing further needs to be      done other than modify risk factors and ask him to be more consistent      about aerobic exercise and looking for any symptoms of reproducible      exertional symptoms which are presently lacking.  5. Health maintenance.  He will need to be updated on Tetanus next year.   FOLLOWUP:  Followup will be every three months; sooner if needed.            ______________________________  [name] Dalton Sherene Sires, MD, [facility]      MBW/MedQ  DD:  10/11/2005  DT:  10/12/2005  Job #:  [id number]

## 2010-05-28 NOTE — Assessment & Plan Note (Signed)
[facility]                               PULMONARY OFFICE NOTE   NAME:[name], [name] SJOGREN                    MRN:          [id number]  DATE:12/02/2005                            DOB:          1955/04/13    HISTORY OF PRESENT ILLNESS:  The patient is a 55 year old black male with  hypertension, on low-dose beta blockers and hyperlipidemia, on Vytorin, who  comes in for acute evaluation today for coughing for the last week,  productive of green, thick mucus.  He denies any pleuritic pain, fevers,  chills, sweats or dyspnea.   PHYSICAL EXAMINATION:  GENERAL:  He is a robust, pleasant, ambulatory black  man, in acute distress.  VITAL SIGNS:  He has stable vital signs.  HEENT:  Unremarkable.  LUNGS:  Lung fields reveal minimum chunky inspiratory and expiratory rhonchi  with adequate air movement.  HEART:  There is regular rate and rhythm without murmurs, rubs or gallops.  ABDOMEN:  Soft, benign.  EXTREMITIES:  Warm without calf tenderness, cyanosis, clubbing or edema.   IMPRESSION:  Acute tracheobronchitis, associated with sore throat,  consistent with a TWAR infection, versus viral, with possible early  bacterial superinfection.   RECOMMENDATIONS:  Doxycycline 100 mg b.i.d. for 7 days, along with  prednisone 10 mg tablets #14 to taper off over 6 days for the inflammatory  component.   For symptomatic relief, I recommended Endal-HD 2 teaspoons every 4 hours and  followup here within 72 hours, if not improving.     Charlaine Dalton. Sherene Sires, MD, [facility]  Electronically Signed    MBW/MedQ  DD: 12/02/2005  DT: 12/02/2005  Job #: [id number]

## 2010-07-06 ENCOUNTER — Telehealth: Payer: Self-pay | Admitting: Internal Medicine

## 2010-07-06 NOTE — Telephone Encounter (Signed)
Spoke with pt.  He states that he had had chills and "feels clammy". He states that he has no appetite, and watery diarrhea x 3 days. He states that he has felt nauseated, but no vomiting. He states that he has tried the pepto bismol with no relief. He does not want to come in for ov, just wants recs from MW. I advised will send msg to MW for recs, and in the meantime should try taking immodium and increase clear fluids- water, gatorade to avoid getting dehydrated. Pt verbalized understanding.

## 2010-07-06 NOTE — Telephone Encounter (Signed)
No other suggestions over the phone but if not better needs ov tomorrow and go to er in meantime if increase abd pain or temp over 101

## 2010-07-06 NOTE — Telephone Encounter (Signed)
Spoke with pt and notified of recs per MW. Pt verbalized understanding, states that he will just give [place] a call back tomorrow if not improving for ov.

## 2010-07-06 NOTE — Telephone Encounter (Signed)
LMTCB

## 2010-07-23 ENCOUNTER — Other Ambulatory Visit: Payer: Self-pay | Admitting: Internal Medicine

## 2010-08-09 ENCOUNTER — Emergency Department (HOSPITAL_COMMUNITY)
Admission: EM | Admit: 2010-08-09 | Discharge: 2010-08-09 | Disposition: A | Discharge status: Home / Self Care | Payer: 59 | Attending: Emergency Medicine | Admitting: Emergency Medicine

## 2010-08-09 ENCOUNTER — Telehealth: Payer: Self-pay | Admitting: Internal Medicine

## 2010-08-09 ENCOUNTER — Emergency Department (HOSPITAL_COMMUNITY): Payer: 59

## 2010-08-09 DIAGNOSIS — Z7982 Long term (current) use of aspirin: Secondary | ICD-10-CM | POA: Insufficient documentation

## 2010-08-09 DIAGNOSIS — R079 Chest pain, unspecified: Secondary | ICD-10-CM | POA: Insufficient documentation

## 2010-08-09 DIAGNOSIS — I10 Essential (primary) hypertension: Secondary | ICD-10-CM | POA: Insufficient documentation

## 2010-08-09 DIAGNOSIS — Z79899 Other long term (current) drug therapy: Secondary | ICD-10-CM | POA: Insufficient documentation

## 2010-08-09 LAB — COMPREHENSIVE METABOLIC PANEL
ALT: 26 U/L (ref 0–53)
AST: 25 U/L (ref 0–37)
Albumin: 4.4 g/dL (ref 3.5–5.2)
Alkaline Phosphatase: 45 U/L (ref 39–117)
BUN: 17 mg/dL (ref 6–23)
CO2: 31 mEq/L (ref 19–32)
Calcium: 10.1 mg/dL (ref 8.4–10.5)
Chloride: 100 mEq/L (ref 96–112)
Creatinine, Ser: 0.89 mg/dL (ref 0.50–1.35)
GFR calc Af Amer: >60 mL/min (ref >60)
GFR calc non Af Amer: >60 mL/min (ref >60)
Glucose, Bld: 134 mg/dL — ABNORMAL HIGH (ref 70–99)
Potassium: 3.6 mEq/L (ref 3.5–5.1)
Sodium: 140 mEq/L (ref 135–145)
Total Bilirubin: 0.4 mg/dL (ref 0.3–1.2)
Total Protein: 7.5 g/dL (ref 6.0–8.3)

## 2010-08-09 LAB — DIFFERENTIAL
Basophils Absolute: 0 10*3/uL (ref 0.0–0.1)
Basophils Relative: 1 % (ref 0–1)
Eosinophils Absolute: 0.1 10*3/uL (ref 0.0–0.7)
Eosinophils Relative: 1 % (ref 0–5)
Lymphocytes Relative: 38 % (ref 12–46)
Lymphs Abs: 2 10*3/uL (ref 0.7–4.0)
Monocytes Absolute: 0.3 10*3/uL (ref 0.1–1.0)
Monocytes Relative: 5 % (ref 3–12)
Neutro Abs: 3 10*3/uL (ref 1.7–7.7)
Neutrophils Relative %: 56 % (ref 43–77)

## 2010-08-09 LAB — CBC
HCT: 39.6 % (ref 39.0–52.0)
Hemoglobin: 14 g/dL (ref 13.0–17.0)
MCH: 30.6 pg (ref 26.0–34.0)
MCHC: 35.4 g/dL (ref 30.0–36.0)
MCV: 86.7 fL (ref 78.0–100.0)
Platelets: 227 10*3/uL (ref 150–400)
RBC: 4.57 MIL/uL (ref 4.22–5.81)
RDW: 12.9 % (ref 11.5–15.5)
WBC: 5.4 10*3/uL (ref 4.0–10.5)

## 2010-08-09 LAB — CK TOTAL AND CKMB (NOT AT ARMC)
CK, MB: 3.4 ng/mL (ref 0.3–4.0)
Relative Index: 1.1 (ref 0.0–2.5)
Total CK: 301 U/L — ABNORMAL HIGH (ref 7–232)

## 2010-08-09 LAB — TROPONIN I
Troponin I: <0.3 ng/mL (ref <0.30)
Troponin I: <0.3 ng/mL (ref <0.30)

## 2010-08-09 NOTE — Telephone Encounter (Signed)
Spoke with pt. He is c/o sharp CP x 2 days- on and off. He states that it feels like he is "being shocked". I advised go to ER asap and pt verbalized understanding.

## 2010-08-10 ENCOUNTER — Telehealth: Payer: Self-pay | Admitting: Internal Medicine

## 2010-08-10 NOTE — Telephone Encounter (Signed)
Spoke with pt and sched appt with MW for 08/12/10 at 10 am.

## 2010-08-10 NOTE — Telephone Encounter (Signed)
Verlon Au, please advise if we can get patient in sooner. Thanks.

## 2010-08-12 ENCOUNTER — Ambulatory Visit (INDEPENDENT_AMBULATORY_CARE_PROVIDER_SITE_OTHER): Payer: 59 | Admitting: Internal Medicine

## 2010-08-12 ENCOUNTER — Encounter: Payer: Self-pay | Admitting: Internal Medicine

## 2010-08-12 VITALS — BP 120/76 | HR 58 | Temp 97.8°F | Ht 68.0 in | Wt 194.4 lb

## 2010-08-12 DIAGNOSIS — I10 Essential (primary) hypertension: Secondary | ICD-10-CM

## 2010-08-12 DIAGNOSIS — E7800 Pure hypercholesterolemia, unspecified: Secondary | ICD-10-CM

## 2010-08-12 DIAGNOSIS — R079 Chest pain, unspecified: Secondary | ICD-10-CM

## 2010-08-12 DIAGNOSIS — I259 Chronic ischemic heart disease, unspecified: Secondary | ICD-10-CM

## 2010-08-12 NOTE — Progress Notes (Deleted)
dgffd

## 2010-08-12 NOTE — Progress Notes (Signed)
Subjective:     Patient ID: [name] [name], male   DOB: May 31, 1955, 55 y.o.   MRN: [id number]  HPI  54 yobm quit smoking in early 1980's with hypertension hyperlipidemia and atypical chest pain with a left heart catheter showing 60% obstruction of the right-sided branches in the 1999 and a negative stress study in May of 2007.   November 20, 2007 ov to refill on vytorin with last ldl 101, no change in rx   July 17, 2008 cpx 2.8 miles power walk q am without difficulty    08/12/2010 ov/Zeniyah Peaster  Cc new onset cp comes and goes x 1 week so went to ER July 30th with neg w/u and much less cp since er.  Location = Left of sternum only, aching but   No relation to activity, lasts no more than a few seconds, not noticeable supine or sleeping.  Does ok with exercise with no pains.    Pt denies any significant sore throat, dysphagia, itching, sneezing,  nasal congestion or excess/ purulent secretions,  fever, chills, sweats, unintended wt loss, clasically pleuritic cp, hempoptysis, orthopnea pnd or leg swelling.    Also denies any obvious fluctuation of symptoms with weather or environmental changes or other aggravating or alleviating factors.     Allergies  1) ! Codeine    Past Medical History:  HYPERTENSION, BENIGN ESSENTIAL, CONTROLLED (ICD-401.1)  OBESITY  - Target wt = 190 for BMI < 30  Colon polyps....................................................................................................[name]  - colonoscopy 4/04, letter sent 05/15/07 reviewed with pt July 17, 2008 and August 03, 2009 > referred back to GI  CHEST PAIN (ICD-786.50)  - IHD with 60% obstruction 1999 RCA negative stress Cardiolite May 2007  HYPERLIPIDEMIA  - target LDL less than 70 based on documented ischemic heart disease  HEALTH MAINTENANCE..........................................................................................[name]  - TD April 2009  - CPX August 03, 2009   Family History:  hypertension in mother and  father no premature heart disease in any direct relatives nor stroke diabetes or cancer, except that his mother has developed leukemia  1 brother healthy   Social History:  quit smoking about 1983 denies alcohol use. 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  wt 201 November 20, 2007 > 205 April 18, 2008 > 201 July 17, 2008 >   > 192 August 03, 2009 > 08/12/2010  HEENT: nl dentition, and orophanx. Severe tubinate edema R > L with eythema, no def purulent secretions or polyps  Neck without JVD/Nodes/TM. carotid upstrokes brisk without bruits  Lungs clear to A and P bilaterally without cough on insp or exp maneuvers  RRR no s3 or murmur or increase in P2. no edema  Abd soft and benign with nl excursion in the supine position.  Ext warm without calf tenderness, cyanosis clubbing  PT >> DP bilaterally   Assessment:         Plan:

## 2010-08-12 NOTE — Assessment & Plan Note (Signed)
Adequate control on present rx, reviewed  

## 2010-08-12 NOTE — Assessment & Plan Note (Signed)

## 2010-08-12 NOTE — Assessment & Plan Note (Signed)
Transient (seconds) nature of the pain strongly against ischemic mechanism but he does have non-obstructive cad so worth having cardiology review his care plan

## 2010-08-12 NOTE — Patient Instructions (Addendum)
citrucel one heaping tsp twice daily with large glass of water  If persists more than 5 min or assoc with nausea, vomiting, short of breathness or radiation to shoulder then this is not gas and needs expedient cardiology referral.  Please see patient coordinator before you leave today  to schedule cardiology follow up   Please schedule a follow up office visit in 6 weeks, call sooner if needed with CPX due tne

## 2010-08-18 ENCOUNTER — Other Ambulatory Visit: Payer: Self-pay | Admitting: Internal Medicine

## 2010-08-29 ENCOUNTER — Other Ambulatory Visit: Payer: Self-pay | Admitting: Internal Medicine

## 2010-08-30 ENCOUNTER — Encounter: Payer: Self-pay | Admitting: Cardiovascular Disease

## 2010-08-30 ENCOUNTER — Ambulatory Visit (INDEPENDENT_AMBULATORY_CARE_PROVIDER_SITE_OTHER): Payer: 59 | Admitting: Cardiovascular Disease

## 2010-08-30 DIAGNOSIS — E7800 Pure hypercholesterolemia, unspecified: Secondary | ICD-10-CM

## 2010-08-30 DIAGNOSIS — R011 Cardiac murmur, unspecified: Secondary | ICD-10-CM

## 2010-08-30 DIAGNOSIS — R079 Chest pain, unspecified: Secondary | ICD-10-CM

## 2010-08-30 DIAGNOSIS — I10 Essential (primary) hypertension: Secondary | ICD-10-CM

## 2010-08-30 NOTE — Patient Instructions (Signed)
Your physician wants you to follow-up in: ONE YEAR  You will receive a reminder letter in the mail two months in advance. If you don't receive a letter, please call our office to schedule the follow-up appointment.   Your physician has requested that you have en exercise stress myoview. For further information please visit [email or web address] Please follow instruction sheet, as given.   Your physician has requested that you have an echocardiogram. Echocardiography is a painless test that uses sound waves to create images of your heart. It provides your doctor with information about the size and shape of your heart and how well your heart's chambers and valves are working. This procedure takes approximately one hour. There are no restrictions for this procedure.

## 2010-08-30 NOTE — Assessment & Plan Note (Signed)
Well controlled.  Continue current medications and low sodium Dash type diet.    

## 2010-08-30 NOTE — Assessment & Plan Note (Signed)
Atypical but history of moderate RCA disease.  F/U myovue

## 2010-08-30 NOTE — Assessment & Plan Note (Signed)
Cholesterol is at goal.  Continue current dose of statin and diet Rx.  No myalgias or side effects.  F/U  LFT's in 6 months. Lab Results  Component Value Date   LDLCALC 81 08/03/2009

## 2010-08-30 NOTE — Progress Notes (Signed)
55  yobm quit smoking in early 1980's with hypertension hyperlipidemia and atypical chest pain with a left heart catheter showing 60% obstruction of the right-sided branches in the 1999 and a negative stress study in May of 2007. November 20, 2007 ov to refill on vytorin with last ldl 101, no change in rx July 17, 2008 cpx 2.8 miles power walk q am without difficulty 08/12/2010 ov/[name] Cc new onset cp comes and goes x 1 week so went to ER July 30th with neg w/u and much less cp since er. Location = Left of sternum only, aching but No relation to activity, lasts no more than a few seconds, not noticeable supine or sleeping. Does ok with exercise with no pains.    ROS: Denies fever, malais, weight loss, blurry vision, decreased visual acuity, cough, sputum, SOB, hemoptysis, pleuritic pain, palpitaitons, heartburn, abdominal pain, melena, lower extremity edema, claudication, or rash.  All other systems reviewed and negative   General: Affect appropriate Healthy:  appears stated age HEENT: normal Neck supple with no adenopathy JVP normal no bruits no thyromegaly Lungs clear with no wheezing and good diaphragmatic motion Heart:  S1/S2 AV SEM  Murmur ,rub, gallop or click PMI normal Abdomen: benighn, BS positve, no tenderness, no AAA no bruit.  No HSM or HJR Distal pulses intact with no bruits No edema Neuro non-focal Skin warm and dry No muscular weakness  Medications Current Outpatient Prescriptions  Medication Sig Dispense Refill  . aspirin 81 MG tablet Take 81 mg by mouth daily.        . clotrimazole-betamethasone (LOTRISONE) cream Apply topically 2 (two) times daily as needed.        Marland Kitchen guaiFENesin-dextromethorphan (ROBITUSSIN DM) 100-10 MG/5ML syrup Same as delsym so use one or the other       . ibuprofen (ADVIL,MOTRIN) 200 MG tablet Take 200 mg by mouth as needed.        . metoprolol (LOPRESSOR) 50 MG tablet        . Pseudoephedrine-Ibuprofen (ADVIL COLD/SINUS) 30-200 MG TABS Take by  mouth. As directed per bottle as needed       . simvastatin (ZOCOR) 40 MG tablet TAKE 1 TABLET AT BEDTIME  30 tablet  11  . sodium chloride (OCEAN) 0.65 % nasal spray As directed per box as needed       . traMADol (ULTRAM) 50 MG tablet 1-2 every 4-6 hours for excess cough or pain       . valsartan-hydrochlorothiazide (DIOVAN-HCT) 160-12.5 MG per tablet Take 1 tablet by mouth daily.          Allergies Codeine  Family History: Family History  Problem Relation Age of Onset  . Hypertension Mother   . Leukemia Mother     Social History: History   Social History  . Marital Status: Married    Spouse Name: N/A    Number of Children: N/A  . Years of Education: N/A   Occupational History  . [employer]    Social History Main Topics  . Smoking status: Former Smoker    Quit date: 01/10/1981  . Smokeless tobacco: Not on file  . Alcohol Use: No  . Drug Use: Not on file  . Sexually Active: Not on file   Other Topics Concern  . Not on file   Social History Narrative   Semi aerobic workouts    Electrocardiogram:  NSR 70 normal ECG    Assessment and Plan

## 2010-08-30 NOTE — Assessment & Plan Note (Signed)
AV murmur.  Check echo  Long standing nature suggests possible bicuspid valve

## 2010-08-31 ENCOUNTER — Encounter: Payer: Self-pay | Admitting: *Deleted

## 2010-09-01 ENCOUNTER — Ambulatory Visit (HOSPITAL_BASED_OUTPATIENT_CLINIC_OR_DEPARTMENT_OTHER): Payer: 59 | Admitting: Radiology

## 2010-09-01 ENCOUNTER — Ambulatory Visit (HOSPITAL_COMMUNITY): Payer: 59 | Attending: Cardiovascular Disease | Admitting: Radiology

## 2010-09-01 DIAGNOSIS — E7800 Pure hypercholesterolemia, unspecified: Secondary | ICD-10-CM | POA: Insufficient documentation

## 2010-09-01 DIAGNOSIS — R011 Cardiac murmur, unspecified: Secondary | ICD-10-CM

## 2010-09-01 DIAGNOSIS — R072 Precordial pain: Secondary | ICD-10-CM | POA: Insufficient documentation

## 2010-09-01 DIAGNOSIS — R079 Chest pain, unspecified: Secondary | ICD-10-CM

## 2010-09-01 DIAGNOSIS — I079 Rheumatic tricuspid valve disease, unspecified: Secondary | ICD-10-CM | POA: Insufficient documentation

## 2010-09-01 DIAGNOSIS — I059 Rheumatic mitral valve disease, unspecified: Secondary | ICD-10-CM | POA: Insufficient documentation

## 2010-09-01 DIAGNOSIS — I319 Disease of pericardium, unspecified: Secondary | ICD-10-CM | POA: Insufficient documentation

## 2010-09-01 DIAGNOSIS — I10 Essential (primary) hypertension: Secondary | ICD-10-CM | POA: Insufficient documentation

## 2010-09-01 MED ORDER — TECHNETIUM TC 99M TETROFOSMIN IV KIT
33.0000 | PACK | Freq: Once | INTRAVENOUS | Status: AC | PRN
  Administered 2010-09-01: 33 via INTRAVENOUS

## 2010-09-01 MED ORDER — TECHNETIUM TC 99M TETROFOSMIN IV KIT
11.0000 | PACK | Freq: Once | INTRAVENOUS | Status: AC | PRN
  Administered 2010-09-01: 11 via INTRAVENOUS

## 2010-09-01 NOTE — Progress Notes (Signed)
[facility] SITE 3 NUCLEAR MED [address] [place] [address] [phone]  Cardiology Nuclear Med Study  [name] [name] is a 55 y.o. male [id number] [birth year]   Nuclear Med Background Indication for Stress Test:  Evaluation for Ischemia and Post Hospital on 07/30/10: CP, (-) Enzymes History:  '99 Cath:Normal coronaries; '04 DGU:YQIHKV; '04 Echo:Normal LVF; '07 QQV:ZDGLOV, EF=64% Cardiac Risk Factors: History of Smoking, Hypertension and Lipids  Symptoms:  Chest Pain (last episode of chest discomfort was about 2-days ago) and DOE   Nuclear Pre-Procedure Caffeine/Decaff Intake:  None NPO After: 7:00pm   Lungs:  Clear. IV 0.9% NS with Angio Cath:  20g  IV Site: R Antecubital  IV Started by:  Irean Hong, RN  Chest Size (in):  46 Cup Size: n/a  Height: 5\' 9"  (1.753 m)  Weight:  192 lb (87.091 kg)  BMI:  Body mass index is 28.35 kg/(m^2). Tech Comments:  Metoprolol not held, per MD    Nuclear Med Study 1 or 2 day study: 1 day  Stress Test Type:  Stress  Reading MD: Charlton Haws, MD  Order Authorizing Provider:  Charlton Haws, MD  Resting Radionuclide: Technetium 65m Tetrofosmin  Resting Radionuclide Dose: 11.0 mCi   Stress Radionuclide:  Technetium 36m Tetrofosmin  Stress Radionuclide Dose: 33.0 mCi           Stress Protocol Rest HR: 53 Stress HR: 164  Rest BP: 155/98 Stress BP: 200/98  Exercise Time (min): 11:00 METS: 13.4   Predicted Max HR: 164 bpm % Max HR: 100 bpm Rate Pressure Product: 56433   Dose of Adenosine (mg):  n/a Dose of Lexiscan: n/a mg  Dose of Atropine (mg): n/a Dose of Dobutamine: n/a mcg/kg/min (at max HR)  Stress Test Technologist: Smiley Houseman,  CMA-N  Nuclear Technologist:  Doyne Keel, CNMT     Rest Procedure:  Myocardial perfusion imaging was performed at rest 45 minutes following the intravenous administration of Technetium 79m Tetrofosmin.  Rest ECG: Nonspecific T-wave changes.  Stress Procedure:  The patient  exercised for eleven minutes on the treadmill utilizing the Bruce protocol.  The patient stopped due to fatigue and denied any chest pain.  There were ST-T wave changes, occasional PVC's, fusion beats and rare PAC's.  Technetium 15m Tetrofosmin was injected at peak exercise and myocardial perfusion imaging was performed after a brief delay.  Stress ECG: Marked ST segment depression over 2 mm in inferolateral leads at peak stress into recovery  QPS Raw Data Images:  Normal; no motion artifact; normal heart/lung ratio. Stress Images:  Normal homogeneous uptake in all areas of the myocardium. Rest Images:  Normal homogeneous uptake in all areas of the myocardium. Subtraction (SDS):  Normal Transient Ischemic Dilatation (Normal <1.22):1.01 Lung/Heart Ratio (Normal <0.45):  0.29  Quantitative Gated Spect Images01 QGS EDV:  94 ml QGS ESV:  36 ml QGS cine images:  NL LV Function; NL Wall Motion QGS EF: 61%  Impression Exercise Capacity:  Good exercise capacity. BP Response:  Hypertensive blood pressure response. Clinical Symptoms:  Had burning in his throat as previoiusly described during consult ECG Impression:  Significant ST abnormalities consistent with ischemia. Comparison with Prior Nuclear Study: No images to compare  Overall Impression:  Normal perfusion images, but ? angina and markedly positive ECG.  Patient to discuss cath with Dr Doree Albee

## 2010-09-02 NOTE — Progress Notes (Signed)
pt aware of results [name] [name]  

## 2010-09-09 ENCOUNTER — Ambulatory Visit (HOSPITAL_COMMUNITY): Admission: RE | Admit: 2010-09-09 | Payer: 59 | Source: Ambulatory Visit | Admitting: Gastroenterology

## 2010-09-16 ENCOUNTER — Telehealth: Payer: Self-pay | Admitting: Internal Medicine

## 2010-09-16 DIAGNOSIS — L84 Corns and callosities: Secondary | ICD-10-CM

## 2010-09-16 NOTE — Telephone Encounter (Signed)
LMOMTCB  Carver Fila, CMA

## 2010-09-17 NOTE — Telephone Encounter (Signed)
Pt aware order has been sent and nothing further was needed

## 2010-09-17 NOTE — Telephone Encounter (Signed)
Spoke with pt. He is c/o corns on his feet that occur every year and are very painful. Wants referral to podiatrist. Please advise, thanks

## 2010-09-17 NOTE — Telephone Encounter (Signed)
Ok to refer to [facility]

## 2010-09-23 ENCOUNTER — Ambulatory Visit (INDEPENDENT_AMBULATORY_CARE_PROVIDER_SITE_OTHER): Payer: 59 | Admitting: Internal Medicine

## 2010-09-23 ENCOUNTER — Encounter: Payer: Self-pay | Admitting: Internal Medicine

## 2010-09-23 VITALS — BP 116/70 | HR 55 | Temp 97.9°F | Ht 68.0 in | Wt 193.4 lb

## 2010-09-23 DIAGNOSIS — R079 Chest pain, unspecified: Secondary | ICD-10-CM

## 2010-09-23 DIAGNOSIS — E7800 Pure hypercholesterolemia, unspecified: Secondary | ICD-10-CM

## 2010-09-23 DIAGNOSIS — I10 Essential (primary) hypertension: Secondary | ICD-10-CM

## 2010-09-23 DIAGNOSIS — Z0000 Encounter for general adult medical examination without abnormal findings: Secondary | ICD-10-CM

## 2010-09-23 NOTE — Patient Instructions (Signed)
Return in am fasting for you lab results and we will call you when the results return  Please schedule a follow up visit in 3 months but call sooner if needed

## 2010-09-23 NOTE — Progress Notes (Signed)
Subjective:     Patient ID: [name] [name], male   DOB: 05-06-1955, 55 y.o.   MRN: [id number]  HPI  54 yobm quit smoking in early 1980's with hypertension hyperlipidemia and atypical chest pain with a left heart catheter showing 60% obstruction of the right-sided branches in the 1999 and a negative stress study in May of 2007.   November 20, 2007 ov to refill on vytorin with last ldl 101, no change in rx   July 17, 2008 cpx 2.8 miles power walk q am without difficulty    08/12/2010 ov/[name] [name]  Cc new onset cp comes and goes x 1 week so went to ER July 30th with neg w/u and much less cp since er.  Location = Left of sternum only, aching but   No relation to activity, lasts no more than a few seconds, not noticeable supine or sleeping.  Does ok with exercise with no pains. rec citrucel one heaping tsp twice daily with large glass of water  If persists more than 5 min or assoc with nausea, vomiting, short of breathness or radiation to shoulder then this is not gas and needs expedient cardiology referral.  Please see patient coordinator before you leave today  to schedule cardiology follow up > neg     09/23/2010 f/u ov/[name] [name] cc cpx may be pain a little less, certainly worse more freq, still just lasts a few secs and not with ex    Pt denies any significant sore throat, dysphagia, itching, sneezing,  nasal congestion or excess/ purulent secretions,  fever, chills, sweats, unintended wt loss, clasically pleuritic cp, hempoptysis, orthopnea pnd or leg swelling.    Also denies any obvious fluctuation of symptoms with weather or environmental changes or other aggravating or alleviating factors.     Allergies  1) ! Codeine    Past Medical History:  HYPERTENSION, BENIGN ESSENTIAL, CONTROLLED (ICD-401.1)  OBESITY  - Target wt = 190 for BMI < 30  Colon polyps....................................................................................................[name]  - colonoscopy 4/04, letter  sent 05/15/07 reviewed with pt July 17, 2008 and August 03, 2009 > referred back to GI  CHEST PAIN (ICD-786.50)  - IHD with 60% obstruction 1999 RCA negative stress Cardiolite May 2007  HYPERLIPIDEMIA  - target LDL less than 70 based on documented ischemic heart disease  HEALTH MAINTENANCE..........................................................................................[name]  - TD April 2009  - CPX 09/23/2010    Family History:  hypertension in mother and father no premature heart disease in any direct relatives nor stroke diabetes or cancer, except that his mother has developed leukemia  1 brother healthy   Social History:  quit smoking about 1983 denies alcohol use. 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  wt 201 November 20, 2007 > 205 April 18, 2008 > 201 July 17, 2008 >   > 192 August 03, 2009 >  09/23/2010  193 HEENT: nl dentition, and orophanx. Severe tubinate edema R > L with eythema, no def purulent secretions or polyps  Neck without JVD/Nodes/TM. carotid upstrokes brisk without bruits  Lungs clear to A and P bilaterally without cough on insp or exp maneuvers  RRR no s3 or murmur or increase in P2. no edema  Abd soft and benign with nl excursion in the supine position.  Ext warm without calf tenderness, cyanosis clubbing  PT >> DP bilaterally  R stronger than L  GU  Testes down bilaterally, no IH, Uncircum Rectal mild bph, stool g neg, no nodules   cxr 08/09/10 Comparison: 08/04/2009  Findings: The heart size and mediastinal contours are within  normal limits. Both lungs are clear. The visualized skeletal  structures are unremarkable.  IMPRESSION:  No active cardiopulmonary disease.  Assessment:         Plan:

## 2010-09-24 ENCOUNTER — Other Ambulatory Visit (INDEPENDENT_AMBULATORY_CARE_PROVIDER_SITE_OTHER): Payer: 59

## 2010-09-24 ENCOUNTER — Encounter: Payer: Self-pay | Admitting: Internal Medicine

## 2010-09-24 DIAGNOSIS — Z0000 Encounter for general adult medical examination without abnormal findings: Secondary | ICD-10-CM

## 2010-09-24 LAB — LIPID PANEL
Cholesterol: 129 mg/dL (ref 0–200)
HDL: 36.8 mg/dL — ABNORMAL LOW (ref >39.00)
LDL Cholesterol: 78 mg/dL (ref 0–99)
Total CHOL/HDL Ratio: 4
Triglycerides: 69 mg/dL (ref 0.0–149.0)
VLDL: 13.8 mg/dL (ref 0.0–40.0)

## 2010-09-24 LAB — URINALYSIS
Bilirubin Urine: NEGATIVE
Hgb urine dipstick: NEGATIVE
Ketones, ur: NEGATIVE
Leukocytes, UA: NEGATIVE
Nitrite: NEGATIVE
Specific Gravity, Urine: 1.025 (ref 1.000–1.030)
Total Protein, Urine: NEGATIVE
Urine Glucose: NEGATIVE
Urobilinogen, UA: 0.2 (ref 0.0–1.0)
pH: 6 (ref 5.0–8.0)

## 2010-09-24 LAB — BASIC METABOLIC PANEL
BUN: 19 mg/dL (ref 6–23)
CO2: 30 mEq/L (ref 19–32)
Calcium: 9.4 mg/dL (ref 8.4–10.5)
Chloride: 102 mEq/L (ref 96–112)
Creatinine, Ser: 1 mg/dL (ref 0.4–1.5)
GFR: 99.37 mL/min (ref >60.00)
Glucose, Bld: 97 mg/dL (ref 70–99)
Potassium: 4.1 mEq/L (ref 3.5–5.1)
Sodium: 140 mEq/L (ref 135–145)

## 2010-09-24 LAB — TSH: TSH: 1.06 u[IU]/mL (ref 0.35–5.50)

## 2010-09-24 LAB — PSA: PSA: 0.44 ng/mL (ref 0.10–4.00)

## 2010-09-24 NOTE — Assessment & Plan Note (Signed)
Adequate control on present rx, reviewed  

## 2010-09-29 ENCOUNTER — Telehealth: Payer: Self-pay | Admitting: Internal Medicine

## 2010-09-29 NOTE — Telephone Encounter (Signed)
Call patient : Studies are unremarkable, no change in recs    I spoke with patient about results and he verbalized understanding and had no questions 

## 2010-10-06 ENCOUNTER — Telehealth: Payer: Self-pay | Admitting: Cardiovascular Disease

## 2010-10-06 NOTE — Telephone Encounter (Signed)
Pt rtn call from debra from yesterday  [phone]

## 2010-10-06 NOTE — Telephone Encounter (Signed)
Spoke with pt, he is aware dr Eden Emms and dr wert have been talking about his cont to have chest pain after having a fine myoview. Per dr Eden Emms pt scheduled for follow up to discuss Deliah Goody

## 2010-10-07 ENCOUNTER — Encounter: Payer: Self-pay | Admitting: Cardiovascular Disease

## 2010-10-07 ENCOUNTER — Ambulatory Visit (INDEPENDENT_AMBULATORY_CARE_PROVIDER_SITE_OTHER): Payer: 59 | Admitting: Cardiovascular Disease

## 2010-10-07 DIAGNOSIS — R079 Chest pain, unspecified: Secondary | ICD-10-CM

## 2010-10-07 DIAGNOSIS — E7800 Pure hypercholesterolemia, unspecified: Secondary | ICD-10-CM

## 2010-10-07 DIAGNOSIS — I10 Essential (primary) hypertension: Secondary | ICD-10-CM

## 2010-10-07 DIAGNOSIS — Z01810 Encounter for preprocedural cardiovascular examination: Secondary | ICD-10-CM

## 2010-10-07 MED ORDER — NITROGLYCERIN 0.4 MG SL SUBL: 0.4000 mg | SUBLINGUAL_TABLET | SUBLINGUAL | Status: DC | PRN

## 2010-10-07 NOTE — Progress Notes (Signed)
55 yobm quit smoking in early 1980's with hypertension hyperlipidemia and atypical chest pain with a left heart catheter showing 60% obstruction of the right-sided branches in the 1999 and a negative stress study in May of 2007. November 20, 2007 ov to refill on vytorin with last ldl 101, no change in rx July 17, 2008 cpx 2.8 miles power walk q am without difficulty 08/12/2010 ov/[name] Cc new onset cp comes and goes x 1 week so went to ER July 30th with neg w/u and much less cp since er. Location = Left of sternum only, aching but No relation to activity, lasts no more than a few seconds, not noticeable supine or sleeping. Does ok with exercise with no pains.  Myouve :  09/01/10  Normal perfusiion but abnormal ECG and SSCP  Discussed with him need for cath given persistant pain and clinically abnormal test.  Cath in 99 ? 60% RCA  ROS: Denies fever, malais, weight loss, blurry vision, decreased visual acuity, cough, sputum, SOB, hemoptysis, pleuritic pain, palpitaitons, heartburn, abdominal pain, melena, lower extremity edema, claudication, or rash.  All other systems reviewed and negative  General: Affect appropriate Healthy:  appears stated age HEENT: normal Neck supple with no adenopathy JVP normal left  bruits no thyromegaly Lungs clear with no wheezing and good diaphragmatic motion Heart:  S1/S2 AS  Murmur no ,rub, gallop or click PMI normal Abdomen: benighn, BS positve, no tenderness, no AAA no bruit.  No HSM or HJR Distal pulses intact with no bruits No edema Neuro non-focal Skin warm and dry No muscular weakness   Current Outpatient Prescriptions  Medication Sig Dispense Refill  . aspirin 81 MG tablet Take 81 mg by mouth daily.        . clotrimazole-betamethasone (LOTRISONE) cream Apply topically 2 (two) times daily as needed.        Marland Kitchen DIOVAN HCT 160-12.5 MG per tablet TAKE 1 TABLET BY MOUTH EVERY MORNING NEED OFFICE VISIT  30 tablet  11  . guaiFENesin-dextromethorphan (ROBITUSSIN  DM) 100-10 MG/5ML syrup as needed. Same as delsym so use one or the other      . ibuprofen (ADVIL,MOTRIN) 200 MG tablet Take 200 mg by mouth as needed.        . metoprolol (LOPRESSOR) 50 MG tablet daily. 1/2tablet      . Pseudoephedrine-Ibuprofen (ADVIL COLD/SINUS) 30-200 MG TABS Take by mouth. As directed per bottle as needed       . simvastatin (ZOCOR) 40 MG tablet TAKE 1 TABLET AT BEDTIME  30 tablet  11  . sodium chloride (OCEAN) 0.65 % nasal spray As directed per box as needed       . traMADol (ULTRAM) 50 MG tablet 1-2 every 4-6 hours for excess cough or pain         Allergies  Codeine  Electrocardiogram:  Assessment and Plan

## 2010-10-07 NOTE — Assessment & Plan Note (Signed)
Cholesterol is at goal.  Continue current dose of statin and diet Rx.  No myalgias or side effects.  F/U  LFT's in 6 months. Lab Results  Component Value Date   LDLCALC 78 09/24/2010

## 2010-10-07 NOTE — Assessment & Plan Note (Signed)
Mild AS will look at pull back gradient during cath.  ? Referred murmur to left neck.  May need F/U carotid duplex  ASA

## 2010-10-07 NOTE — Progress Notes (Signed)
Addended by: Early Chars on: 10/07/2010 05:53 PM   Modules accepted: Orders

## 2010-10-07 NOTE — Assessment & Plan Note (Signed)
Well controlled.  Continue current medications and low sodium Dash type diet.    

## 2010-10-07 NOTE — Patient Instructions (Signed)
Your physician recommends that you schedule a follow-up appointment in: 4 weeks  Your physician has requested that you have a cardiac catheterization. Cardiac catheterization is used to diagnose and/or treat various heart conditions. Doctors may recommend this procedure for a number of different reasons. The most common reason is to evaluate chest pain. Chest pain can be a symptom of coronary artery disease (CAD), and cardiac catheterization can show whether plaque is narrowing or blocking your heart's arteries. This procedure is also used to evaluate the valves, as well as measure the blood flow and oxygen levels in different parts of your heart. For further information please visit [email or web address] Please follow instruction sheet, as given.   Your physician recommends that you return for lab work in: today

## 2010-10-07 NOTE — Assessment & Plan Note (Signed)
Persistant with abnormal ECG and clinical chest pain during myovue.  Cath discussed including risks  Willing to proceed

## 2010-10-08 LAB — CBC WITH DIFFERENTIAL/PLATELET
Basophils Absolute: 0 10*3/uL (ref 0.0–0.1)
Basophils Relative: 0.7 % (ref 0.0–3.0)
Eosinophils Absolute: 0.1 10*3/uL (ref 0.0–0.7)
Eosinophils Relative: 1.2 % (ref 0.0–5.0)
HCT: 42.2 % (ref 39.0–52.0)
Hemoglobin: 13.9 g/dL (ref 13.0–17.0)
Lymphocytes Relative: 49.1 % — ABNORMAL HIGH (ref 12.0–46.0)
Lymphs Abs: 2.4 10*3/uL (ref 0.7–4.0)
MCHC: 32.9 g/dL (ref 30.0–36.0)
MCV: 93.5 fl (ref 78.0–100.0)
Monocytes Absolute: 0.5 10*3/uL (ref 0.1–1.0)
Monocytes Relative: 10.2 % (ref 3.0–12.0)
Neutro Abs: 1.9 10*3/uL (ref 1.4–7.7)
Neutrophils Relative %: 38.8 % — ABNORMAL LOW (ref 43.0–77.0)
Platelets: 234 10*3/uL (ref 150.0–400.0)
RBC: 4.51 Mil/uL (ref 4.22–5.81)
RDW: 13.6 % (ref 11.5–14.6)
WBC: 5 10*3/uL (ref 4.5–10.5)

## 2010-10-08 LAB — BASIC METABOLIC PANEL
BUN: 20 mg/dL (ref 6–23)
CO2: 31 mEq/L (ref 19–32)
Calcium: 10 mg/dL (ref 8.4–10.5)
Chloride: 103 mEq/L (ref 96–112)
Creatinine, Ser: 1 mg/dL (ref 0.4–1.5)
GFR: 94.96 mL/min (ref >60.00)
Glucose, Bld: 103 mg/dL — ABNORMAL HIGH (ref 70–99)
Potassium: 4.6 mEq/L (ref 3.5–5.1)
Sodium: 144 mEq/L (ref 135–145)

## 2010-10-08 LAB — PROTIME-INR
INR: 1.06 (ref <1.50)
Prothrombin Time: 14.2 seconds (ref 11.6–15.2)

## 2010-10-08 LAB — APTT: aPTT: 46 seconds — ABNORMAL HIGH (ref 24–37)

## 2010-10-08 NOTE — Progress Notes (Signed)
Addended by: Early Chars on: 10/08/2010 12:32 PM   Modules accepted: Orders

## 2010-10-11 ENCOUNTER — Encounter: Payer: Self-pay | Admitting: *Deleted

## 2010-10-13 ENCOUNTER — Encounter: Payer: Self-pay | Admitting: *Deleted

## 2010-10-18 ENCOUNTER — Inpatient Hospital Stay (HOSPITAL_COMMUNITY): Payer: 59

## 2010-10-18 ENCOUNTER — Inpatient Hospital Stay (HOSPITAL_COMMUNITY)
Admission: AD | Admit: 2010-10-18 | Discharge: 2010-10-24 | DRG: 234 | Disposition: A | Discharge status: Home Health | Payer: 59 | Source: Ambulatory Visit | Attending: Cardiothoracic Surgery | Admitting: Cardiothoracic Surgery

## 2010-10-18 ENCOUNTER — Inpatient Hospital Stay (HOSPITAL_BASED_OUTPATIENT_CLINIC_OR_DEPARTMENT_OTHER)
Admission: RE | Admit: 2010-10-18 | Discharge: 2010-10-18 | Disposition: A | Discharge status: Other Acute Inpatient Hospital | Payer: 59 | Source: Ambulatory Visit | Attending: Cardiovascular Disease | Admitting: Cardiovascular Disease

## 2010-10-18 DIAGNOSIS — I2510 Atherosclerotic heart disease of native coronary artery without angina pectoris: Principal | ICD-10-CM | POA: Diagnosis present

## 2010-10-18 DIAGNOSIS — I209 Angina pectoris, unspecified: Secondary | ICD-10-CM | POA: Insufficient documentation

## 2010-10-18 DIAGNOSIS — Z87891 Personal history of nicotine dependence: Secondary | ICD-10-CM

## 2010-10-18 DIAGNOSIS — D62 Acute posthemorrhagic anemia: Secondary | ICD-10-CM | POA: Diagnosis not present

## 2010-10-18 DIAGNOSIS — I10 Essential (primary) hypertension: Secondary | ICD-10-CM | POA: Diagnosis present

## 2010-10-18 DIAGNOSIS — E785 Hyperlipidemia, unspecified: Secondary | ICD-10-CM | POA: Diagnosis present

## 2010-10-18 DIAGNOSIS — Z01810 Encounter for preprocedural cardiovascular examination: Secondary | ICD-10-CM

## 2010-10-18 LAB — COMPREHENSIVE METABOLIC PANEL
ALT: 31 U/L (ref 0–53)
AST: 26 U/L (ref 0–37)
Albumin: 4.5 g/dL (ref 3.5–5.2)
Alkaline Phosphatase: 47 U/L (ref 39–117)
BUN: 15 mg/dL (ref 6–23)
CO2: 30 mEq/L (ref 19–32)
Calcium: 9.9 mg/dL (ref 8.4–10.5)
Chloride: 100 mEq/L (ref 96–112)
Creatinine, Ser: 1.03 mg/dL (ref 0.50–1.35)
GFR calc Af Amer: >90 mL/min (ref >90)
GFR calc non Af Amer: 79 mL/min — ABNORMAL LOW (ref >90)
Glucose, Bld: 94 mg/dL (ref 70–99)
Potassium: 4.1 mEq/L (ref 3.5–5.1)
Sodium: 139 mEq/L (ref 135–145)
Total Bilirubin: 0.5 mg/dL (ref 0.3–1.2)
Total Protein: 7.7 g/dL (ref 6.0–8.3)

## 2010-10-18 LAB — APTT: aPTT: 37 seconds (ref 24–37)

## 2010-10-18 LAB — ABO/RH: ABO/RH(D): AB POS

## 2010-10-18 LAB — PROTIME-INR
INR: 1.01 (ref 0.00–1.49)
Prothrombin Time: 13.5 seconds (ref 11.6–15.2)

## 2010-10-19 ENCOUNTER — Inpatient Hospital Stay (HOSPITAL_COMMUNITY): Payer: 59

## 2010-10-19 DIAGNOSIS — I2510 Atherosclerotic heart disease of native coronary artery without angina pectoris: Secondary | ICD-10-CM

## 2010-10-19 LAB — POCT I-STAT 4, (NA,K, GLUC, HGB,HCT)
Glucose, Bld: 108 mg/dL — ABNORMAL HIGH (ref 70–99)
Glucose, Bld: 125 mg/dL — ABNORMAL HIGH (ref 70–99)
Glucose, Bld: 106 mg/dL — ABNORMAL HIGH (ref 70–99)
Glucose, Bld: 130 mg/dL — ABNORMAL HIGH (ref 70–99)
Glucose, Bld: 130 mg/dL — ABNORMAL HIGH (ref 70–99)
Glucose, Bld: 105 mg/dL — ABNORMAL HIGH (ref 70–99)
HCT: 36 % — ABNORMAL LOW (ref 39.0–52.0)
HCT: 32 % — ABNORMAL LOW (ref 39.0–52.0)
HCT: 25 % — ABNORMAL LOW (ref 39.0–52.0)
HCT: 30 % — ABNORMAL LOW (ref 39.0–52.0)
HCT: 22 % — ABNORMAL LOW (ref 39.0–52.0)
HCT: 27 % — ABNORMAL LOW (ref 39.0–52.0)
Hemoglobin: 12.2 g/dL — ABNORMAL LOW (ref 13.0–17.0)
Hemoglobin: 10.9 g/dL — ABNORMAL LOW (ref 13.0–17.0)
Hemoglobin: 8.5 g/dL — ABNORMAL LOW (ref 13.0–17.0)
Hemoglobin: 10.2 g/dL — ABNORMAL LOW (ref 13.0–17.0)
Hemoglobin: 7.5 g/dL — ABNORMAL LOW (ref 13.0–17.0)
Hemoglobin: 9.2 g/dL — ABNORMAL LOW (ref 13.0–17.0)
Potassium: 3.6 mEq/L (ref 3.5–5.1)
Potassium: 3.9 mEq/L (ref 3.5–5.1)
Potassium: 4.4 mEq/L (ref 3.5–5.1)
Potassium: 6 mEq/L — ABNORMAL HIGH (ref 3.5–5.1)
Potassium: 4.4 mEq/L (ref 3.5–5.1)
Potassium: 3.8 mEq/L (ref 3.5–5.1)
Sodium: 140 mEq/L (ref 135–145)
Sodium: 140 mEq/L (ref 135–145)
Sodium: 136 mEq/L (ref 135–145)
Sodium: 137 mEq/L (ref 135–145)
Sodium: 141 mEq/L (ref 135–145)
Sodium: 146 mEq/L — ABNORMAL HIGH (ref 135–145)

## 2010-10-19 LAB — POCT I-STAT GLUCOSE
Glucose, Bld: 120 mg/dL — ABNORMAL HIGH (ref 70–99)
Operator id: 3390

## 2010-10-19 LAB — PROTIME-INR
INR: 1.76 — ABNORMAL HIGH (ref 0.00–1.49)
Prothrombin Time: 20.8 seconds — ABNORMAL HIGH (ref 11.6–15.2)

## 2010-10-19 LAB — POCT I-STAT 3, ART BLOOD GAS (G3+)
Acid-Base Excess: 2 mmol/L (ref 0.0–2.0)
Acid-Base Excess: 1 mmol/L (ref 0.0–2.0)
Acid-base deficit: 1 mmol/L (ref 0.0–2.0)
Bicarbonate: 26.1 mEq/L — ABNORMAL HIGH (ref 20.0–24.0)
Bicarbonate: 24.3 mEq/L — ABNORMAL HIGH (ref 20.0–24.0)
Bicarbonate: 23.7 mEq/L (ref 20.0–24.0)
Bicarbonate: 25.8 mEq/L — ABNORMAL HIGH (ref 20.0–24.0)
O2 Saturation: 100 %
O2 Saturation: 99 %
O2 Saturation: 90 %
O2 Saturation: 99 %
Patient temperature: 32
Patient temperature: 37
Patient temperature: 34.1
Patient temperature: 37.1
TCO2: 27 mmol/L (ref 0–100)
TCO2: 25 mmol/L (ref 0–100)
TCO2: 25 mmol/L (ref 0–100)
TCO2: 27 mmol/L (ref 0–100)
pCO2 arterial: 30.1 mmHg — ABNORMAL LOW (ref 35.0–45.0)
pCO2 arterial: 32 mmHg — ABNORMAL LOW (ref 35.0–45.0)
pCO2 arterial: 32.5 mmHg — ABNORMAL LOW (ref 35.0–45.0)
pCO2 arterial: 46.9 mmHg — ABNORMAL HIGH (ref 35.0–45.0)
pH, Arterial: 7.526 — ABNORMAL HIGH (ref 7.350–7.450)
pH, Arterial: 7.488 — ABNORMAL HIGH (ref 7.350–7.450)
pH, Arterial: 7.459 — ABNORMAL HIGH (ref 7.350–7.450)
pH, Arterial: 7.349 — ABNORMAL LOW (ref 7.350–7.450)
pO2, Arterial: 263 mmHg — ABNORMAL HIGH (ref 80.0–100.0)
pO2, Arterial: 146 mmHg — ABNORMAL HIGH (ref 80.0–100.0)
pO2, Arterial: 48 mmHg — ABNORMAL LOW (ref 80.0–100.0)
pO2, Arterial: 152 mmHg — ABNORMAL HIGH (ref 80.0–100.0)

## 2010-10-19 LAB — APTT: aPTT: 43 seconds — ABNORMAL HIGH (ref 24–37)

## 2010-10-19 LAB — HEMOGLOBIN A1C
Hgb A1c MFr Bld: 6.4 % — ABNORMAL HIGH (ref <5.7)
Mean Plasma Glucose: 137 mg/dL — ABNORMAL HIGH (ref <117)

## 2010-10-19 LAB — CBC
HCT: 40.4 % (ref 39.0–52.0)
HCT: 26.3 % — ABNORMAL LOW (ref 39.0–52.0)
HCT: 24.9 % — ABNORMAL LOW (ref 39.0–52.0)
Hemoglobin: 14 g/dL (ref 13.0–17.0)
Hemoglobin: 9.1 g/dL — ABNORMAL LOW (ref 13.0–17.0)
Hemoglobin: 8.6 g/dL — ABNORMAL LOW (ref 13.0–17.0)
MCH: 30.4 pg (ref 26.0–34.0)
MCH: 29.9 pg (ref 26.0–34.0)
MCH: 30.1 pg (ref 26.0–34.0)
MCHC: 34.7 g/dL (ref 30.0–36.0)
MCHC: 34.6 g/dL (ref 30.0–36.0)
MCHC: 34.5 g/dL (ref 30.0–36.0)
MCV: 87.8 fL (ref 78.0–100.0)
MCV: 86.5 fL (ref 78.0–100.0)
MCV: 87.1 fL (ref 78.0–100.0)
Platelets: 230 10*3/uL (ref 150–400)
Platelets: 119 10*3/uL — ABNORMAL LOW (ref 150–400)
Platelets: 123 10*3/uL — ABNORMAL LOW (ref 150–400)
RBC: 4.6 MIL/uL (ref 4.22–5.81)
RBC: 3.04 MIL/uL — ABNORMAL LOW (ref 4.22–5.81)
RBC: 2.86 MIL/uL — ABNORMAL LOW (ref 4.22–5.81)
RDW: 13 % (ref 11.5–15.5)
RDW: 12.9 % (ref 11.5–15.5)
RDW: 13.1 % (ref 11.5–15.5)
WBC: 7.1 10*3/uL (ref 4.0–10.5)
WBC: 6.6 10*3/uL (ref 4.0–10.5)
WBC: 6.3 10*3/uL (ref 4.0–10.5)

## 2010-10-19 LAB — BASIC METABOLIC PANEL
BUN: 15 mg/dL (ref 6–23)
CO2: 31 mEq/L (ref 19–32)
Calcium: 9.8 mg/dL (ref 8.4–10.5)
Chloride: 99 mEq/L (ref 96–112)
Creatinine, Ser: 0.92 mg/dL (ref 0.50–1.35)
GFR calc Af Amer: >90 mL/min (ref >90)
GFR calc non Af Amer: >90 mL/min (ref >90)
Glucose, Bld: 93 mg/dL (ref 70–99)
Potassium: 3.4 mEq/L — ABNORMAL LOW (ref 3.5–5.1)
Sodium: 139 mEq/L (ref 135–145)

## 2010-10-19 LAB — POCT I-STAT 3, VENOUS BLOOD GAS (G3P V)
Acid-Base Excess: 1 mmol/L (ref 0.0–2.0)
Bicarbonate: 25.8 mEq/L — ABNORMAL HIGH (ref 20.0–24.0)
O2 Saturation: 80 %
Patient temperature: 32.5
TCO2: 27 mmol/L (ref 0–100)
pCO2, Ven: 33.6 mmHg — ABNORMAL LOW (ref 45.0–50.0)
pH, Ven: 7.474 — ABNORMAL HIGH (ref 7.250–7.300)
pO2, Ven: 32 mmHg (ref 30.0–45.0)

## 2010-10-19 LAB — SURGICAL PCR SCREEN
MRSA, PCR: NEGATIVE
Staphylococcus aureus: NEGATIVE

## 2010-10-19 LAB — HEMOGLOBIN AND HEMATOCRIT, BLOOD
HCT: 28.1 % — ABNORMAL LOW (ref 39.0–52.0)
Hemoglobin: 9.9 g/dL — ABNORMAL LOW (ref 13.0–17.0)

## 2010-10-19 LAB — BLOOD GAS, ARTERIAL
Acid-Base Excess: 4.3 mmol/L — ABNORMAL HIGH (ref 0.0–2.0)
Bicarbonate: 28.3 mEq/L — ABNORMAL HIGH (ref 20.0–24.0)
Drawn by: 33176
FIO2: 0.21 %
O2 Saturation: 96.4 %
Patient temperature: 98.6
TCO2: 29.6 mmol/L (ref 0–100)
pCO2 arterial: 42.7 mmHg (ref 35.0–45.0)
pH, Arterial: 7.437 (ref 7.350–7.450)
pO2, Arterial: 78.5 mmHg — ABNORMAL LOW (ref 80.0–100.0)

## 2010-10-19 LAB — MAGNESIUM: Magnesium: 2.9 mg/dL — ABNORMAL HIGH (ref 1.5–2.5)

## 2010-10-19 LAB — POCT I-STAT, CHEM 8
BUN: 10 mg/dL (ref 6–23)
Calcium, Ion: 1.18 mmol/L (ref 1.12–1.32)
Chloride: 107 mEq/L (ref 96–112)
Creatinine, Ser: 0.8 mg/dL (ref 0.50–1.35)
Glucose, Bld: 135 mg/dL — ABNORMAL HIGH (ref 70–99)
HCT: 25 % — ABNORMAL LOW (ref 39.0–52.0)
Hemoglobin: 8.5 g/dL — ABNORMAL LOW (ref 13.0–17.0)
Potassium: 4.1 mEq/L (ref 3.5–5.1)
Sodium: 146 mEq/L — ABNORMAL HIGH (ref 135–145)
TCO2: 24 mmol/L (ref 0–100)

## 2010-10-19 LAB — CREATININE, SERUM
Creatinine, Ser: 0.75 mg/dL (ref 0.50–1.35)
GFR calc Af Amer: >90 mL/min (ref >90)
GFR calc non Af Amer: >90 mL/min (ref >90)

## 2010-10-19 LAB — HEPARIN LEVEL (UNFRACTIONATED): Heparin Unfractionated: <0.1 IU/mL — ABNORMAL LOW (ref 0.30–0.70)

## 2010-10-19 LAB — PLATELET COUNT: Platelets: 169 10*3/uL (ref 150–400)

## 2010-10-19 NOTE — Cardiovascular Report (Signed)
  NAMEETHAN, CLAYBURN             ACCOUNT NO.:  [id number]  MEDICAL RECORD NO.:  [id number]  LOCATION:  2035                         FACILITY:  [facility]  PHYSICIAN:  Noralyn Pick. Eden Emms, MD, FACCDATE OF BIRTH:  06-25-1955  DATE OF PROCEDURE:  10/18/2010 DATE OF DISCHARGE:                           CARDIAC CATHETERIZATION   INDICATION:  Exercise Myoview study with normal nuclear images; however, the patient had what appeared to be angina and abnormal EKG.  Cine catheterization done.  4-French catheters from right femoral artery. Standard JL-4 catheter was used to engage the left main.  Standard Williams catheter was used to engage the right.  Left main coronary artery had a short segment and was normal.  Left anterior descending artery had 20-30% multiple lesions proximally. There was a 90% eccentric lesion in the midvessel.  Distal vessel was somewhat diminutive, but did wrap the apex.  The first diagonal branch had 80% mid vessel disease.  Circumflex coronary artery was nondominant.  There was a large second obtuse marginal branch in the AV groove branch.  The first obtuse marginal branch is medium-sized with a 90% focal stenosis.  The right coronary artery was dominant.  There was 20-30% disease in the mid and distal portion of the ostium of the PDA and mid PDA had 95% stenosis.  There was a somewhat unusual branch artery coming off an RV branch.  It appeared to associate with a lateral wall; however, it moves with a heart, this may need further followup by CT scanning.  RAO ventriculography:  RAO ventriculography showed hyperdynamic LV function with an EF of 60%.  There is no gradient across the aortic valve and no MR.  The aortic pressure was 118/79.  LV pressure was 118/14.  IMPRESSION:  The patient has severe three-vessel disease.  His nuclear images may have been normal due to balanced ischemia.  I do not think he can have complete revascularization with stenting.  He  will be admitted to the hospital.  We will continue aspirin and beta blockade.  He will have heparin started after his sheath removal and his groin looks fine. He will be consulted on by CBPS.  Further recommendations will be based on their review.     Noralyn Pick. Eden Emms, MD, [facility]    PCN/MEDQ  D:  10/18/2010  T:  10/18/2010  Job:  [id number]  Electronically Signed by Charlton Haws MD [facility] on 10/19/2010 07:26:45 PM

## 2010-10-19 NOTE — Consult Note (Addendum)
NAMEWORTHINGTON, CRUZAN             ACCOUNT NO.:  [id number]  MEDICAL RECORD NO.:  [id number]  LOCATION:  2035                         FACILITY:  [facility]  PHYSICIAN:  Noralyn Pick. Eden Emms, MD, FACCDATE OF BIRTH:  August 22, 1955  DATE OF CONSULTATION:  10/18/2010 DATE OF DISCHARGE:                                CONSULTATION   REQUESTING PHYSICIAN:  Noralyn Pick. Eden Emms, MD, [facility]  FOLLOWUP CARDIOLOGY:  Noralyn Pick. Eden Emms, MD, [facility]  PRIMARY CARE PHYSICIAN:  Charlaine Dalton. Sherene Sires, MD, FCCP  REASON FOR CONSULTATION:  Coronary occlusive disease.  HISTORY OF PRESENT ILLNESS:  The patient is a 55 year old male with mild known coronary occlusive disease involving the right coronary artery from a cath in 1999.  During the past summer, he had noted several episodes of fleeting chest discomfort usually not related to exertion necessarily and discussed this with Dr. Sherene Sires and then was seen by Dr. Eden Emms.  On September 01, 2010, Myoview stress test was noted to be positive and an echocardiogram was also performed, which showed no evidence of aortic stenosis.  Because of the abnormal EKG findings at the time of stress test, a cardiac catheterization was recommended and performed by Dr. Eden Emms today, which showed severe disease of the LAD, first obtuse marginal, and PD, PL branches of the right coronary artery.  The patient has a history of hypertension, hyperlipidemia.  Denies diabetes.  He is a remote smoker and quit in 1980.  He has had no previous stroke.  No history of claudication.  No known renal insufficiency.  PAST SURGICAL HISTORY: Vasectomy in 1985 and hemorrhoidectomy secondary to bleeding hemorrhoids 4 years ago.  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  FAMILY HISTORY:  The patient's mother and father are both alive with no cardiac disease.  He has two brothers, one old and one younger that are healthy.  One grandchild has congenital aortic  stenosis.  One daughter works as a Engineer, civil (consulting) in [place].  CURRENT MEDICATIONS: 1. Aspirin 325 mg a day. 2. Toprol 25 mg a day. 3. Simvastatin 40 mg a day and has been started on heparin.  ALLERGIES:  The patient notes that TYLENOL NO. 3, caused migraine symptoms.  REVIEW OF SYSTEMS: CHEST:  Positive for chest pain.  He denies resting shortness of breath. Denies exertional shortness of breath.  Denies orthopnea, syncope, presyncope, palpitations, or lower extremity edema.  GENERAL:  Denies fever chills or night sweats.  Current weight is 188 pounds, notes he has been as high as 208, but purposely has attempted to lose weight. RESPIRATORY:  Denies shortness of breath, has no hemoptysis or wheezing. GASTROINTESTINAL:  No changes in bowel habits.  No blood in his stool since the hemorrhoid surgery 4 years ago.  MUSCULOSKELETAL:  Denies musculoskeletal difficulty.  GENITOURINARY:  Denies any hematuria or difficulty with urination.  Denies any recent infections.  Denies polyuria or polydipsia.  PSYCHIATRIC:  Denies psychiatric history. Other review of systems are negative.  PHYSICAL EXAMINATION:  VITAL SIGNS:  The patient's blood pressure 157/100, heart rate 68, respiratory rate is 18, temperature is 98, O2 sats 98%, 5 feet 9 inches tall, 188 pounds. GENERAL:  The  patient is awake, alert, neurologically intact and able to relate his history with good detail. NECK:  I do not appreciate any carotid bruits, does have a soft early systolic murmur heard along the precordium and along the left sternal border. ABDOMEN:  Benign without palpable masses.  He has a cath site with a dressing at the right groin without significant hematoma. EXTREMITIES:  He has palpable femoral pulses, palpable DP and PT pulses bilaterally.  Appears to have adequate vein for bypass in the lower extremities.  LABORATORY FINDINGS: Pulmonary function studies that are interpreted as normal with an FEV1 of 3.53,  carotid Doppler showed no significant disease.  His potassium is 4.6, creatinine is 1.0.  BUN is 20, hematocrit is 42, white count 5, platelet count 234.  PT/INR 14.2/1.06.  Cardiac catheterization films were reviewed.  The patient has diffuse disease involving LAD with areas approximately of 90% stenosis.  The first obtuse marginal vessel has a 90% stenosis.  The body of the right coronary artery is without severe disease, but both PD and PL branches are very small and diffusely diseased.  Overall, ventricular function was preserved.  IMPRESSION:  The patient with atypical chest pain symptoms with abnormal stress test and now found to have significant three-vessel disease with primarily significant proximal LAD disease.  The risks and options of surgery versus angioplasty versus medical treatment discussed with the patient and his wife in detail.  The risks of surgery including death, infection, stroke, myocardial infarction, bleeding, blood transfusion all discussed with the patient in detail.  He is willing to proceed.  We will tentatively plan for proceeding with surgery tomorrow on October 19, 2010.     Sheliah Plane, MD   ______________________________ Noralyn Pick. Eden Emms, MD, [facility]    EG/MEDQ  D:  10/18/2010  T:  10/19/2010  Job:  [id number]  cc:   Charlaine Dalton. Sherene Sires, MD, [facility]  Electronically Signed by Sheliah Plane MD on 10/19/2010 02:04:37 PM Electronically Signed by Charlton Haws MD [facility] on 10/19/2010 07:26:41 PM

## 2010-10-20 ENCOUNTER — Inpatient Hospital Stay (HOSPITAL_COMMUNITY): Payer: 59

## 2010-10-20 DIAGNOSIS — I200 Unstable angina: Secondary | ICD-10-CM

## 2010-10-20 LAB — BASIC METABOLIC PANEL
BUN: 13 mg/dL (ref 6–23)
CO2: 25 mEq/L (ref 19–32)
Calcium: 7.5 mg/dL — ABNORMAL LOW (ref 8.4–10.5)
Chloride: 111 mEq/L (ref 96–112)
Creatinine, Ser: 0.81 mg/dL (ref 0.50–1.35)
GFR calc Af Amer: >90 mL/min (ref >90)
GFR calc non Af Amer: >90 mL/min (ref >90)
Glucose, Bld: 135 mg/dL — ABNORMAL HIGH (ref 70–99)
Potassium: 4 mEq/L (ref 3.5–5.1)
Sodium: 144 mEq/L (ref 135–145)

## 2010-10-20 LAB — POCT I-STAT, CHEM 8
BUN: 20 mg/dL (ref 6–23)
Calcium, Ion: 1.15 mmol/L (ref 1.12–1.32)
Chloride: 103 mEq/L (ref 96–112)
Creatinine, Ser: 1 mg/dL (ref 0.50–1.35)
Glucose, Bld: 153 mg/dL — ABNORMAL HIGH (ref 70–99)
HCT: 24 % — ABNORMAL LOW (ref 39.0–52.0)
Hemoglobin: 8.2 g/dL — ABNORMAL LOW (ref 13.0–17.0)
Potassium: 4 mEq/L (ref 3.5–5.1)
Sodium: 141 mEq/L (ref 135–145)
TCO2: 25 mmol/L (ref 0–100)

## 2010-10-20 LAB — GLUCOSE, CAPILLARY
Glucose-Capillary: 85 mg/dL (ref 70–99)
Glucose-Capillary: 134 mg/dL — ABNORMAL HIGH (ref 70–99)
Glucose-Capillary: 102 mg/dL — ABNORMAL HIGH (ref 70–99)
Glucose-Capillary: 102 mg/dL — ABNORMAL HIGH (ref 70–99)
Glucose-Capillary: 102 mg/dL — ABNORMAL HIGH (ref 70–99)
Glucose-Capillary: 110 mg/dL — ABNORMAL HIGH (ref 70–99)
Glucose-Capillary: 111 mg/dL — ABNORMAL HIGH (ref 70–99)
Glucose-Capillary: 113 mg/dL — ABNORMAL HIGH (ref 70–99)
Glucose-Capillary: 118 mg/dL — ABNORMAL HIGH (ref 70–99)
Glucose-Capillary: 120 mg/dL — ABNORMAL HIGH (ref 70–99)
Glucose-Capillary: 122 mg/dL — ABNORMAL HIGH (ref 70–99)
Glucose-Capillary: 127 mg/dL — ABNORMAL HIGH (ref 70–99)
Glucose-Capillary: 129 mg/dL — ABNORMAL HIGH (ref 70–99)
Glucose-Capillary: 95 mg/dL (ref 70–99)
Glucose-Capillary: 99 mg/dL (ref 70–99)
Glucose-Capillary: 110 mg/dL — ABNORMAL HIGH (ref 70–99)
Glucose-Capillary: 145 mg/dL — ABNORMAL HIGH (ref 70–99)
Glucose-Capillary: 149 mg/dL — ABNORMAL HIGH (ref 70–99)
Glucose-Capillary: 132 mg/dL — ABNORMAL HIGH (ref 70–99)

## 2010-10-20 LAB — BASIC METABOLIC PANEL WITH GFR
BUN: 19 mg/dL (ref 6–23)
CO2: 27 meq/L (ref 19–32)
Calcium: 8.2 mg/dL — ABNORMAL LOW (ref 8.4–10.5)
Chloride: 105 meq/L (ref 96–112)
Creatinine, Ser: 0.84 mg/dL (ref 0.50–1.35)
GFR calc Af Amer: >90 mL/min
GFR calc non Af Amer: >90 mL/min
Glucose, Bld: 161 mg/dL — ABNORMAL HIGH (ref 70–99)
Potassium: 4 meq/L (ref 3.5–5.1)
Sodium: 138 meq/L (ref 135–145)

## 2010-10-20 LAB — CBC
HCT: 21.2 % — ABNORMAL LOW (ref 39.0–52.0)
HCT: 26.8 % — ABNORMAL LOW (ref 39.0–52.0)
Hemoglobin: 7.3 g/dL — ABNORMAL LOW (ref 13.0–17.0)
Hemoglobin: 9.2 g/dL — ABNORMAL LOW (ref 13.0–17.0)
MCH: 30.4 pg (ref 26.0–34.0)
MCH: 30.6 pg (ref 26.0–34.0)
MCHC: 34.4 g/dL (ref 30.0–36.0)
MCHC: 34.3 g/dL (ref 30.0–36.0)
MCV: 88.3 fL (ref 78.0–100.0)
MCV: 89 fL (ref 78.0–100.0)
Platelets: 107 10*3/uL — ABNORMAL LOW (ref 150–400)
Platelets: 125 10*3/uL — ABNORMAL LOW (ref 150–400)
RBC: 2.4 MIL/uL — ABNORMAL LOW (ref 4.22–5.81)
RBC: 3.01 MIL/uL — ABNORMAL LOW (ref 4.22–5.81)
RDW: 13.5 % (ref 11.5–15.5)
RDW: 14.3 % (ref 11.5–15.5)
WBC: 5.7 10*3/uL (ref 4.0–10.5)
WBC: 9 10*3/uL (ref 4.0–10.5)

## 2010-10-20 LAB — MAGNESIUM
Magnesium: 2.6 mg/dL — ABNORMAL HIGH (ref 1.5–2.5)
Magnesium: 2.5 mg/dL (ref 1.5–2.5)

## 2010-10-20 LAB — HEPARIN LEVEL (UNFRACTIONATED): Heparin Unfractionated: <0.1 IU/mL — ABNORMAL LOW (ref 0.30–0.70)

## 2010-10-21 ENCOUNTER — Inpatient Hospital Stay (HOSPITAL_COMMUNITY): Payer: 59

## 2010-10-21 HISTORY — PX: OTHER SURGICAL HISTORY: SHX169

## 2010-10-21 LAB — CBC
HCT: 26.1 % — ABNORMAL LOW (ref 39.0–52.0)
HCT: 13.4 % — ABNORMAL LOW (ref 39.0–52.0)
HCT: 40.3
HCT: 28.7 % — ABNORMAL LOW (ref 39.0–52.0)
Hemoglobin: 8.9 g/dL — ABNORMAL LOW (ref 13.0–17.0)
Hemoglobin: 4.5 g/dL — CL (ref 13.0–17.0)
Hemoglobin: 14.1
Hemoglobin: 9.7 g/dL — ABNORMAL LOW (ref 13.0–17.0)
MCH: 30.4 pg (ref 26.0–34.0)
MCH: 30.5 pg (ref 26.0–34.0)
MCH: 30.5 pg (ref 26.0–34.0)
MCHC: 34.1 g/dL (ref 30.0–36.0)
MCHC: 33.6 g/dL (ref 30.0–36.0)
MCHC: 35.1
MCHC: 33.8 g/dL (ref 30.0–36.0)
MCV: 89.4 fL (ref 78.0–100.0)
MCV: 90.5 fL (ref 78.0–100.0)
MCV: 87.9
MCV: 90.3 fL (ref 78.0–100.0)
Platelets: 122 10*3/uL — ABNORMAL LOW (ref 150–400)
Platelets: 242
Platelets: 137 10*3/uL — ABNORMAL LOW (ref 150–400)
RBC: 2.92 MIL/uL — ABNORMAL LOW (ref 4.22–5.81)
RBC: 1.48 MIL/uL — ABNORMAL LOW (ref 4.22–5.81)
RBC: 4.58
RBC: 3.18 MIL/uL — ABNORMAL LOW (ref 4.22–5.81)
RDW: 14.3 % (ref 11.5–15.5)
RDW: 14.5 % (ref 11.5–15.5)
RDW: 13
RDW: 14.3 % (ref 11.5–15.5)
WBC: 9 10*3/uL (ref 4.0–10.5)
WBC: 4.5 10*3/uL (ref 4.0–10.5)
WBC: 7.2
WBC: 9.2 10*3/uL (ref 4.0–10.5)

## 2010-10-21 LAB — BASIC METABOLIC PANEL
BUN: 19 mg/dL (ref 6–23)
BUN: 18 mg/dL (ref 6–23)
CO2: 31 mEq/L (ref 19–32)
CO2: 33 mEq/L — ABNORMAL HIGH (ref 19–32)
Calcium: 8.3 mg/dL — ABNORMAL LOW (ref 8.4–10.5)
Calcium: 9.1 mg/dL (ref 8.4–10.5)
Chloride: 105 mEq/L (ref 96–112)
Chloride: 102 mEq/L (ref 96–112)
Creatinine, Ser: 0.71 mg/dL (ref 0.50–1.35)
Creatinine, Ser: 0.84 mg/dL (ref 0.50–1.35)
GFR calc Af Amer: >90 mL/min (ref >90)
GFR calc Af Amer: >90 mL/min (ref >90)
GFR calc non Af Amer: >90 mL/min (ref >90)
GFR calc non Af Amer: >90 mL/min (ref >90)
Glucose, Bld: 140 mg/dL — ABNORMAL HIGH (ref 70–99)
Glucose, Bld: 142 mg/dL — ABNORMAL HIGH (ref 70–99)
Potassium: 4.1 mEq/L (ref 3.5–5.1)
Potassium: 4.9 mEq/L (ref 3.5–5.1)
Sodium: 139 mEq/L (ref 135–145)
Sodium: 139 mEq/L (ref 135–145)

## 2010-10-21 LAB — DIFFERENTIAL
Basophils Absolute: 0
Basophils Relative: 1
Eosinophils Absolute: 0.1
Eosinophils Relative: 1
Lymphocytes Relative: 22
Lymphs Abs: 1.6
Monocytes Absolute: 0.6
Monocytes Relative: 8
Neutro Abs: 4.9
Neutrophils Relative %: 69

## 2010-10-21 LAB — COMPREHENSIVE METABOLIC PANEL
ALT: 35
AST: 32
Albumin: 4.4
Alkaline Phosphatase: 36 — ABNORMAL LOW
BUN: 15
CO2: 29
Calcium: 9.4
Chloride: 101
Creatinine, Ser: 1.04
GFR calc Af Amer: >60
GFR calc non Af Amer: >60
Glucose, Bld: 114 — ABNORMAL HIGH
Potassium: 3.4 — ABNORMAL LOW
Sodium: 139
Total Bilirubin: 1
Total Protein: 7.4

## 2010-10-21 LAB — GLUCOSE, CAPILLARY
Glucose-Capillary: 124 mg/dL — ABNORMAL HIGH (ref 70–99)
Glucose-Capillary: 127 mg/dL — ABNORMAL HIGH (ref 70–99)
Glucose-Capillary: 129 mg/dL — ABNORMAL HIGH (ref 70–99)
Glucose-Capillary: 141 mg/dL — ABNORMAL HIGH (ref 70–99)
Glucose-Capillary: 145 mg/dL — ABNORMAL HIGH (ref 70–99)
Glucose-Capillary: 150 mg/dL — ABNORMAL HIGH (ref 70–99)

## 2010-10-21 LAB — PROTIME-INR
INR: 1.1
Prothrombin Time: 13.9

## 2010-10-21 LAB — APTT: aPTT: 37

## 2010-10-22 ENCOUNTER — Inpatient Hospital Stay (HOSPITAL_COMMUNITY): Payer: 59

## 2010-10-22 DIAGNOSIS — I2510 Atherosclerotic heart disease of native coronary artery without angina pectoris: Secondary | ICD-10-CM

## 2010-10-22 LAB — BASIC METABOLIC PANEL
BUN: 19 mg/dL (ref 6–23)
CO2: 32 mEq/L (ref 19–32)
Calcium: 8.5 mg/dL (ref 8.4–10.5)
Chloride: 100 mEq/L (ref 96–112)
Creatinine, Ser: 0.81 mg/dL (ref 0.50–1.35)
GFR calc Af Amer: >90 mL/min (ref >90)
GFR calc non Af Amer: >90 mL/min (ref >90)
Glucose, Bld: 129 mg/dL — ABNORMAL HIGH (ref 70–99)
Potassium: 4.1 mEq/L (ref 3.5–5.1)
Sodium: 137 mEq/L (ref 135–145)

## 2010-10-22 LAB — CROSSMATCH
ABO/RH(D): AB POS
Antibody Screen: NEGATIVE
Unit division: 0
Unit division: 0
Unit division: 0

## 2010-10-22 LAB — CBC
HCT: 26.8 % — ABNORMAL LOW (ref 39.0–52.0)
Hemoglobin: 8.9 g/dL — ABNORMAL LOW (ref 13.0–17.0)
MCH: 30.2 pg (ref 26.0–34.0)
MCHC: 33.2 g/dL (ref 30.0–36.0)
MCV: 90.8 fL (ref 78.0–100.0)
Platelets: 149 10*3/uL — ABNORMAL LOW (ref 150–400)
RBC: 2.95 MIL/uL — ABNORMAL LOW (ref 4.22–5.81)
RDW: 14.1 % (ref 11.5–15.5)
WBC: 8.9 10*3/uL (ref 4.0–10.5)

## 2010-10-22 LAB — GLUCOSE, CAPILLARY
Glucose-Capillary: 119 mg/dL — ABNORMAL HIGH (ref 70–99)
Glucose-Capillary: 120 mg/dL — ABNORMAL HIGH (ref 70–99)
Glucose-Capillary: 115 mg/dL — ABNORMAL HIGH (ref 70–99)
Glucose-Capillary: 125 mg/dL — ABNORMAL HIGH (ref 70–99)
Glucose-Capillary: 126 mg/dL — ABNORMAL HIGH (ref 70–99)
Glucose-Capillary: 94 mg/dL (ref 70–99)

## 2010-10-23 LAB — GLUCOSE, CAPILLARY
Glucose-Capillary: 96 mg/dL (ref 70–99)
Glucose-Capillary: 131 mg/dL — ABNORMAL HIGH (ref 70–99)
Glucose-Capillary: 107 mg/dL — ABNORMAL HIGH (ref 70–99)
Glucose-Capillary: 157 mg/dL — ABNORMAL HIGH (ref 70–99)

## 2010-10-24 LAB — GLUCOSE, CAPILLARY: Glucose-Capillary: 100 mg/dL — ABNORMAL HIGH (ref 70–99)

## 2010-10-25 ENCOUNTER — Telehealth: Payer: Self-pay | Admitting: Cardiovascular Disease

## 2010-10-25 NOTE — Telephone Encounter (Signed)
Spoke with pt wife, aware may remove bandage today. Can use a band aid if needed. Made aware of f/u appt Deliah Goody

## 2010-10-25 NOTE — Telephone Encounter (Signed)
Pt wants to know when bandage needs to come off after heart cath he had done on 1008 please call

## 2010-10-27 NOTE — Op Note (Signed)
NAMEGAUTHAM, HEWINS             ACCOUNT NO.:  [id number]  MEDICAL RECORD NO.:  [id number]  LOCATION:  2312                         FACILITY:  [facility]  PHYSICIAN:  Sheliah Plane, MD    DATE OF BIRTH:  [birth year]  DATE OF PROCEDURE:  10/19/2010 DATE OF DISCHARGE:                              OPERATIVE REPORT   PREOPERATIVE DIAGNOSIS:  Coronary occlusive disease with positive stress test and new onset of angina.  POSTOPERATIVE DIAGNOSIS:  Coronary occlusive disease with positive stress test and new onset of angina.  SURGICAL PROCEDURE:  Coronary artery bypass grafting x4 with the left internal mammary to the left anterior descending coronary artery, reverse saphenous vein graft to the second diagonal coronary artery, reverse saphenous vein graft to the first obtuse marginal, reverse saphenous vein graft to the posterior descending coronary artery with right leg endovein harvesting.  SURGEON:  Sheliah Plane, MD  FIRST ASSISTANT:  Coral Ceo, PA  BRIEF HISTORY:  The patient is a 55 year old male who over the past several months has had intermittent chest discomfort that appeared atypical.  The patient was seen by Dr. Sherene Sires and Dr. Eden Emms.  Because of this stress test was performed and the patient has continued chest discomfort, cardiac catheterization was performed by Dr. Eden Emms, which demonstrated significant proximal LAD disease of greater than 80% to 90% obstruction of a relatively small first obtuse marginal, the body of the circumflex was without significant disease.  The body of the right coronary artery was without significant disease and the patient had very diffuse distal disease in the posterior descending.  Overall ventricular function was preserved primarily because of the patient's symptoms, positive stress test, and proximal complex LAD disease, coronary artery bypass grafting was recommended.  Risks and options were discussed with the patient in detail, and  he was willing to proceed.  DESCRIPTION OF PROCEDURE:  With Swan-Ganz arterial line monitors in place, the patient underwent general endotracheal anesthesia without incident.  Skin of chest and legs were prepped with Betadine and draped in usual sterile manner.  Using the Guidant endovein harvesting system, vein was harvested from the right thigh and calf and was of good quality and caliber.  Median sternotomy was performed.  Left internal mammary artery dissected down as a pedicle graft.  The distal artery was divided, had good free flow.  Pericardium was opened.  Overall ventricular function appeared preserved.  The patient was systemically heparinized.  Ascending aorta was cannulated.  The right atrium was cannulated and aortic root vent cardioplegia needle was introduced into the ascending aorta.  The patient was placed on cardiopulmonary bypass 2.4 L/minute per metered squared.  Site of anastomosis were selected and dissected at the epicardium.  The patient's body temperature was cooled to 32 degrees.  Aortic crossclamp was applied and 500 mL of cold blood cardioplegia was administered with diastolic arrest of the heart. Myocardial septal temperatures monitored throughout the crossclamp. Attention was turned first to the OM-1 vessel, which was relatively small 1.3 mm in size.  The vessel was opened, was somewhat thin-walled using a running 8-0 Prolene.  The second reverse saphenous vein graft was anastomosed to the first obtuse marginal.  Attention was then turned to  the second diagonal, which was slightly larger than appreciated from the films.  The vessel was opened and was 1.2 to 1.3 mm in size.  Using a running 7-0 Prolene a distal anastomosis was performed.  Attention was then turned to the posterior descending.  The distal right coronary artery was significantly calcified.  There were tandem lesions in the posterior descending coronary artery, forcing the anastomosis to  be fairly distal on the vessel.  Posterior lateral branch was very small and not bypassable.  The posterior descending was opened and a 1 mm probe passed distally, but not proximally.  Using a running 8-0 Prolene, a segment of reverse saphenous vein graft was anastomosed to the posterior descending.  This vessel would be too small for redo bypass surgery.  Attention was then turned to the left anterior descending coronary artery, which was opened in its midportion, a 1.5 mm probe passed distally using a running 8-0 Prolene.  Left internal mammary artery was anastomosed to the left anterior descending coronary artery with release of the bulldog on the mammary, there was rise in myocardial septal temp.  Bulldog was placed back on the mammary artery.  Three punch aortotomies were then performed in the ascending aorta.  Each of the 3 vein grafts anastomosed to the ascending aorta.  Air was evacuated from the grafts in the ascending aorta.  An aortic cross-clamp was removed with total cross-clamp time of 98 minutes.  Prior to removal of the crossclamp, the bulldog and mammary artery was removed and there was prompt rise in myocardial septal temperature.  The patient spontaneously converted to a sinus rhythm.  Atrial and ventricular pacing wires were applied.  Sites of anastomosis were inspected free of bleeding.  The patient was then ventilated and weaned from cardiopulmonary bypass without difficulty.  He remained hemodynamically stable and was decannulated in usual fashion.  Protamine sulfate was administered with operative field hemostatic.  A left pleural tube and a Blake mediastinal drain was left in place.  Pericardium was closed with silk sutures.  The sternum was closed with #6 stainless steel wire.  Fascia closed interrupted 0-Vicryl, running 3-0 Vicryl in subcuticular stitch and skin edges.  Dry dressings were applied.  Sponge and needle count was reported as correct at the  completion of procedure.  Total pump time was 127 minutes.  The patient did not require any blood products during the operative procedure and was transferred to surgical intensive care unit for further postoperative care.     Sheliah Plane, MD     EG/MEDQ  D:  10/20/2010  T:  10/20/2010  Job:  [id number]  cc:   Charlaine Dalton. Sherene Sires, MD, FCCP Peter C. Eden Emms, MD, [facility]  Electronically Signed by Sheliah Plane MD on 10/27/2010 06:15:38 PM

## 2010-10-27 NOTE — Discharge Summary (Signed)
[name] [name], [name] [name]             ACCOUNT NO.:  [id number]  MEDICAL RECORD NO.:  [id number]  LOCATION:  2022                         FACILITY:  [facility]  PHYSICIAN:  Sheliah Plane, MD    DATE OF BIRTH:  29-Dec-1955  DATE OF ADMISSION:  10/18/2010 DATE OF DISCHARGE:  10/24/2010                              DISCHARGE SUMMARY   PRIMARY ADMITTING DIAGNOSIS:  Chest pain.  ADDITIONAL/DISCHARGE DIAGNOSES: 1. Coronary artery disease. 2. New-onset angina. 3. Hypertension. 4. Hyperlipidemia. 5. Remote history of tobacco abuse. 6. Postoperative acute blood loss anemia.  PROCEDURES PERFORMED: 1. Cardiac catheterization. 2. Coronary artery bypass grafting x4 (left internal mammary artery to     the LAD, saphenous vein graft to the second diagonal, saphenous     vein graft to the first obtuse marginal, saphenous vein graft to     the posterior descending). 3. Endoscopic vein harvest, right leg.  HISTORY:  The patient is a 55 year old male with previous history of coronary artery disease, who has been managed medically and has been stable and in his usual state of health.  Over the summer, he had noticed several episodes of fleeting chest discomfort which were not related to exertion.  Recently when he visited Dr. Sherene Sires, he relayed these episodes and it was felt that he should be evaluated by Cardiology.  He was seen in August 2012, with a Myoview stress test which was noted to be positive.  An echocardiogram was also performed which showed no evidence of aortic stenosis.  Because of his symptoms and abnormal EKG findings at the time of the stress test, it was recommended that he proceed with cardiac catheterization.  This was performed on the date of his admission and he was found to have severe coronary artery disease including diffuse disease of the LAD of approximately 90% stenosis, a 90% stenosis of the first obtuse marginal, diffuse disease to PD and PL, and well-preserved left  ventricular ejection fraction.  Because of these findings, it was felt that he should be admitted following his catheterization and undergo a surgical consultation for potential coronary artery revascularization.  HOSPITAL COURSE:  The patient was admitted to [facility] and remained stable and pain free.  He was seen by Dr. Sheliah Plane and his films were reviewed.  Dr. [name] agreed with sending for coronary artery bypass grafting.  All risks, benefits, and alternatives of surgery were explained to the patient and he agreed to proceed.  He was taken to the operating room on October 19, 2010, and underwent CABG x4 as described above.  Please see previously dictated operative report for complete details of surgery.  He tolerated the procedure well and was transferred from the SICU in stable condition.  He was extubated shortly after surgery.  He was hemodynamically stable and doing well on postop day 1.  He did remain in the ICU for additional observation and by postop day 2, was ready for transfer to the step-down unit.  His postoperative course has been generally uneventful.  He has been mildly volume overloaded and was started on Lasix to which he is responding well.  He has remained afebrile and his vital signs have been stable. He is  maintaining normal sinus rhythm.  His labs on postop day 4 showed a sodium of 137, potassium 4.1, BUN 19, creatinine 0.81.  Hemoglobin 8.9, hematocrit 26.8, platelets 149, white count 8.9.  Chest x-ray shows a small apical pneumothorax.  We will continue to observe the patient over the next 24 hours and hopefully by October 24, 2010, if he is stable and no acute changes occur, he will be ready for discharge home.  DISCHARGE MEDICATIONS: 1. Enteric-coated aspirin 325 mg daily. 2. Folic acid 1 mg daily. 3. Lasix 40 mg daily x7 days. 4. Lopressor 25 mg b.i.d. 5. Oxycodone IR 5-10 mg q.3 h. p.r.n. pain. 6. Potassium 20 mEq daily x7 days. 7.  Metamucil daily. 8. Ocean nasal spray daily p.r.n. 9. Simvastatin 40 mg at bedtime.  DISCHARGE INSTRUCTIONS:  He is asked to refrain from driving, heavy lifting, or strenuous activity.  He may continue ambulating daily and using his incentive spirometer.  He may shower daily and clean his incisions with soap and water.  He will continue his same preoperative diet.  DISCHARGE FOLLOWUP:  He will need to make an appointment to see Dr. Eden Emms in 2 weeks.  He will then see Dr. [name] in 3 weeks with a chest x-ray.  In the interim, if he experiences any problems or has questions, he is asked to contact our office immediately.     Coral Ceo, P.A.   ______________________________ Sheliah Plane, MD    GC/MEDQ  D:  10/23/2010  T:  10/23/2010  Job:  [id number]  Electronically Signed by Coral Ceo P.A. on 10/27/2010 10:40:45 AM Electronically Signed by Sheliah Plane MD on 10/27/2010 06:15:53 PM

## 2010-10-29 ENCOUNTER — Telehealth: Payer: Self-pay | Admitting: Internal Medicine

## 2010-10-29 NOTE — Telephone Encounter (Signed)
This is c/w constipation pain/ gas pain. rec reduce pain meds as much as possible Increase miralox (double the dose daily until results To er if pain worsens or fever or vomiting

## 2010-10-29 NOTE — Telephone Encounter (Signed)
Called and spoke with pt's wife.  Wife states pt had bypass surgery on 10/8.  Pt did have bm on 10/11 but hasn't had one since.  Pt is currently on oxycodone for pain.  Wife states she has spoken with Dr. Nydia Bouton on Tuesday who recommended pt try taking Miralax, suppositories, fleet enema, and senokot.  Pt has followed these recs and still no BM since 10/11.  Pt has an occ gas. Wife states pt was c/o R sided abd pain which has now moved to L side abd pain.  Wife states she called Dr. Halina Maidens office back today for more recs and was told to call here for MW"s advise.  MW, please advise.  Thanks.

## 2010-10-29 NOTE — Telephone Encounter (Signed)
Called and spoke with lisa and she is aware per MW--to decrease the pain meds as much as possible.  Increase miralax--can double the dose daily until results and go to the er in increase in pain, fever or vomiting.  Misty Stanley voiced her understanding of recs.

## 2010-11-05 ENCOUNTER — Ambulatory Visit (INDEPENDENT_AMBULATORY_CARE_PROVIDER_SITE_OTHER): Payer: 59 | Admitting: Cardiovascular Disease

## 2010-11-05 ENCOUNTER — Ambulatory Visit (INDEPENDENT_AMBULATORY_CARE_PROVIDER_SITE_OTHER)
Admission: RE | Admit: 2010-11-05 | Discharge: 2010-11-05 | Disposition: A | Discharge status: Home / Self Care | Payer: 59 | Source: Ambulatory Visit | Attending: Cardiovascular Disease | Admitting: Cardiovascular Disease

## 2010-11-05 ENCOUNTER — Encounter: Payer: Self-pay | Admitting: Cardiothoracic Surgery

## 2010-11-05 ENCOUNTER — Encounter (INDEPENDENT_AMBULATORY_CARE_PROVIDER_SITE_OTHER): Payer: 59 | Admitting: *Deleted

## 2010-11-05 ENCOUNTER — Encounter: Payer: Self-pay | Admitting: Cardiovascular Disease

## 2010-11-05 DIAGNOSIS — I10 Essential (primary) hypertension: Secondary | ICD-10-CM

## 2010-11-05 DIAGNOSIS — Z9889 Other specified postprocedural states: Secondary | ICD-10-CM

## 2010-11-05 DIAGNOSIS — Z951 Presence of aortocoronary bypass graft: Secondary | ICD-10-CM

## 2010-11-05 DIAGNOSIS — I2510 Atherosclerotic heart disease of native coronary artery without angina pectoris: Secondary | ICD-10-CM | POA: Insufficient documentation

## 2010-11-05 DIAGNOSIS — Z720 Tobacco use: Secondary | ICD-10-CM

## 2010-11-05 DIAGNOSIS — M79609 Pain in unspecified limb: Secondary | ICD-10-CM

## 2010-11-05 DIAGNOSIS — I82409 Acute embolism and thrombosis of unspecified deep veins of unspecified lower extremity: Secondary | ICD-10-CM

## 2010-11-05 DIAGNOSIS — I8000 Phlebitis and thrombophlebitis of superficial vessels of unspecified lower extremity: Secondary | ICD-10-CM

## 2010-11-05 DIAGNOSIS — E7800 Pure hypercholesterolemia, unspecified: Secondary | ICD-10-CM

## 2010-11-05 LAB — CBC WITH DIFFERENTIAL/PLATELET
Basophils Absolute: 0 10*3/uL (ref 0.0–0.1)
Basophils Relative: 0.6 % (ref 0.0–3.0)
Eosinophils Absolute: 0.4 10*3/uL (ref 0.0–0.7)
Eosinophils Relative: 5.5 % — ABNORMAL HIGH (ref 0.0–5.0)
HCT: 28.8 % — ABNORMAL LOW (ref 39.0–52.0)
Hemoglobin: 9.4 g/dL — ABNORMAL LOW (ref 13.0–17.0)
Lymphocytes Relative: 22.9 % (ref 12.0–46.0)
Lymphs Abs: 1.5 10*3/uL (ref 0.7–4.0)
MCHC: 32.6 g/dL (ref 30.0–36.0)
MCV: 92.8 fl (ref 78.0–100.0)
Monocytes Absolute: 0.5 10*3/uL (ref 0.1–1.0)
Monocytes Relative: 7.4 % (ref 3.0–12.0)
Neutro Abs: 4.2 10*3/uL (ref 1.4–7.7)
Neutrophils Relative %: 63.6 % (ref 43.0–77.0)
Platelets: 737 10*3/uL — ABNORMAL HIGH (ref 150.0–400.0)
RBC: 3.11 Mil/uL — ABNORMAL LOW (ref 4.22–5.81)
RDW: 13.6 % (ref 11.5–14.6)
WBC: 6.6 10*3/uL (ref 4.5–10.5)

## 2010-11-05 LAB — SEDIMENTATION RATE: Sed Rate: 40 mm/hr — ABNORMAL HIGH (ref 0–22)

## 2010-11-05 MED ORDER — OXYCODONE HCL 5 MG PO TABS: 5.0000 mg | ORAL_TABLET | ORAL | Status: DC | PRN

## 2010-11-05 NOTE — Assessment & Plan Note (Signed)
Well controlled.  Continue current medications and low sodium Dash type diet.    

## 2010-11-05 NOTE — Assessment & Plan Note (Signed)
Post CABG.  Low grade fever.  Sternum ok.  ? Post Pericardiotomy syndrome.  Check ESR/CBC.  Use incentive spirometer.  Check CXR.  Will do LE venous duplex on LLE.  F/U Dr [name] next week.  If Hct still low will start iron.

## 2010-11-05 NOTE — Patient Instructions (Addendum)
Your physician recommends that you schedule a follow-up appointment in: 4-6 WEEKS  Your physician recommends that you return for lab work in: TODAY  A chest x-ray takes a picture of the organs and structures inside the chest, including the heart, lungs, and blood vessels. This test can show several things, including, whether the heart is enlarges; whether fluid is building up in the lungs; and whether pacemaker / defibrillator leads are still in place. AT [facility] [address]  Your physician has requested that you have a lower extremity venous duplex. This test is an ultrasound of the veins in the legs or arms. It looks at venous blood flow that carries blood from the heart to the legs or arms. Allow one hour for a Lower Venous exam. Allow thirty minutes for an Upper Venous exam. There are no restrictions or special instructions.

## 2010-11-05 NOTE — Assessment & Plan Note (Signed)
Cholesterol is at goal.  Continue current dose of statin and diet Rx.  No myalgias or side effects.  F/U  LFT's in 6 months. Lab Results  Component Value Date   LDLCALC 78 09/24/2010             

## 2010-11-05 NOTE — Progress Notes (Signed)
55 yo with myovue beginning of October normal perfusion but abnormal ECG response.  Cath showed severe 3VD with normal LV.  Underwent CABG  CABG 10/21/10 Gerhardt  PROCEDURES PERFORMED:   1. Cardiac catheterization.   2. Coronary artery bypass grafting x4 (left internal mammary artery to       the LAD, saphenous vein graft to the second diagonal, saphenous       vein graft to the first obtuse marginal, saphenous vein graft to       the posterior descending).   3. Endoscopic vein harvest, right leg  Some post op bleeding with D/C Hct of 26.8  Had severe constipation from narcotics. Improved.  Bruising and pain right thigh.  Low grade fevers.  Chest still hurts in recumbancy  ROS: Denies fever, malais, weight loss, blurry vision, decreased visual acuity, cough, sputum, SOB, hemoptysis, pleuritic pain, palpitaitons, heartburn, abdominal pain, melena, lower extremity edema, claudication, or rash.  All other systems reviewed and negative  General: Affect appropriate Healthy:  appears stated age HEENT: normal Neck supple with no adenopathy JVP normal no bruits no thyromegaly Lungs clear with no wheezing and good diaphragmatic motion Heart:  S1/S2 SEM  No rub, gallop or click Sternum well healed but tender PMI normal Abdomen: benighn, BS positve, no tenderness, no AAA no bruit.  No HSM or HJR Distal pulses intact with no bruits No edema Neuro non-focal Skin warm and dry Thrombosed vein in left thigh above endoscopic harvest sight ? No muscular weakness   Current Outpatient Prescriptions  Medication Sig Dispense Refill  . acetaminophen (TYLENOL) 500 MG tablet Take 500 mg by mouth every 6 (six) hours as needed.        Marland Kitchen aspirin 325 MG EC tablet Take 325 mg by mouth daily.        . clotrimazole-betamethasone (LOTRISONE) cream Apply topically 2 (two) times daily as needed.        . docusate sodium (COLACE) 100 MG capsule Take 100 mg by mouth 2 (two) times daily.        . folic acid  (FOLVITE) 1 MG tablet Take 1 mg by mouth daily.        Marland Kitchen guaiFENesin-dextromethorphan (ROBITUSSIN DM) 100-10 MG/5ML syrup as needed. Same as delsym so use one or the other      . ibuprofen (ADVIL,MOTRIN) 200 MG tablet Take 200 mg by mouth as needed.        . metoprolol tartrate (LOPRESSOR) 25 MG tablet Take 25 mg by mouth 2 (two) times daily.        . nitroGLYCERIN (NITROSTAT) 0.4 MG SL tablet Place 1 tablet (0.4 mg total) under the tongue every 5 (five) minutes as needed for chest pain.  25 tablet  12  . oxyCODONE (OXY IR/ROXICODONE) 5 MG immediate release tablet Take 5 mg by mouth every 4 (four) hours as needed.        . Polyethylene Glycol 3350 (MIRALAX PO) Take by mouth as needed.        . Pseudoephedrine-Ibuprofen (ADVIL COLD/SINUS) 30-200 MG TABS Take by mouth. As directed per bottle as needed       . senna (SENOKOT) 8.6 MG tablet Take 1 tablet by mouth as needed.        . simvastatin (ZOCOR) 40 MG tablet TAKE 1 TABLET AT BEDTIME  30 tablet  11  . sodium chloride (OCEAN) 0.65 % nasal spray As directed per box as needed       . traMADol (ULTRAM) 50  MG tablet Take 50 mg by mouth every 6 (six) hours as needed. Maximum dose= 8 tablets per day         Allergies  Codeine  Electrocardiogram:  Assessment and Plan

## 2010-11-08 ENCOUNTER — Telehealth: Payer: Self-pay | Admitting: *Deleted

## 2010-11-08 DIAGNOSIS — I8290 Acute embolism and thrombosis of unspecified vein: Secondary | ICD-10-CM

## 2010-11-08 NOTE — Telephone Encounter (Signed)
Spoke with pt, aware of labs, cxr and lower venous dopplers. Follow up dopplers scheduled [name] [name]

## 2010-11-08 NOTE — Telephone Encounter (Signed)
Message copied by Freddi Starr on Mon Nov 08, 2010 10:52 AM ------      Message from: Wendall Stade      Created: Sun Nov 07, 2010 10:44 PM       Make sure he is taking asa.  Hct low but ok post CABG  Needs F/U duplex for GSV thrombosis in 4 weeks

## 2010-11-10 ENCOUNTER — Other Ambulatory Visit: Payer: Self-pay | Admitting: Cardiothoracic Surgery

## 2010-11-10 DIAGNOSIS — I2510 Atherosclerotic heart disease of native coronary artery without angina pectoris: Secondary | ICD-10-CM

## 2010-11-14 ENCOUNTER — Other Ambulatory Visit: Payer: Self-pay

## 2010-11-14 ENCOUNTER — Emergency Department (HOSPITAL_COMMUNITY): Payer: 59

## 2010-11-14 ENCOUNTER — Encounter (HOSPITAL_COMMUNITY): Payer: Self-pay | Admitting: Radiology

## 2010-11-14 ENCOUNTER — Emergency Department (HOSPITAL_COMMUNITY)
Admission: EM | Admit: 2010-11-14 | Discharge: 2010-11-14 | Disposition: A | Discharge status: Home / Self Care | Payer: 59 | Attending: Emergency Medicine | Admitting: Emergency Medicine

## 2010-11-14 DIAGNOSIS — Z951 Presence of aortocoronary bypass graft: Secondary | ICD-10-CM | POA: Insufficient documentation

## 2010-11-14 DIAGNOSIS — R0602 Shortness of breath: Secondary | ICD-10-CM | POA: Insufficient documentation

## 2010-11-14 DIAGNOSIS — I10 Essential (primary) hypertension: Secondary | ICD-10-CM | POA: Insufficient documentation

## 2010-11-14 DIAGNOSIS — R071 Chest pain on breathing: Secondary | ICD-10-CM | POA: Insufficient documentation

## 2010-11-14 HISTORY — DX: Cardiac murmur, unspecified: R01.1

## 2010-11-14 LAB — CBC
HCT: 30.5 % — ABNORMAL LOW (ref 39.0–52.0)
Hemoglobin: 10 g/dL — ABNORMAL LOW (ref 13.0–17.0)
MCH: 29.8 pg (ref 26.0–34.0)
MCHC: 32.8 g/dL (ref 30.0–36.0)
MCV: 90.8 fL (ref 78.0–100.0)
Platelets: 263 10*3/uL (ref 150–400)
RBC: 3.36 MIL/uL — ABNORMAL LOW (ref 4.22–5.81)
RDW: 13.9 % (ref 11.5–15.5)
WBC: 5.1 10*3/uL (ref 4.0–10.5)

## 2010-11-14 LAB — COMPREHENSIVE METABOLIC PANEL
ALT: 33 U/L (ref 0–53)
AST: 24 U/L (ref 0–37)
Albumin: 4.1 g/dL (ref 3.5–5.2)
Alkaline Phosphatase: 66 U/L (ref 39–117)
BUN: 19 mg/dL (ref 6–23)
CO2: 26 mEq/L (ref 19–32)
Calcium: 9.7 mg/dL (ref 8.4–10.5)
Chloride: 103 mEq/L (ref 96–112)
Creatinine, Ser: 0.92 mg/dL (ref 0.50–1.35)
GFR calc Af Amer: >90 mL/min (ref >90)
GFR calc non Af Amer: >90 mL/min (ref >90)
Glucose, Bld: 99 mg/dL (ref 70–99)
Potassium: 4.4 mEq/L (ref 3.5–5.1)
Sodium: 138 mEq/L (ref 135–145)
Total Bilirubin: 0.7 mg/dL (ref 0.3–1.2)
Total Protein: 7.3 g/dL (ref 6.0–8.3)

## 2010-11-14 LAB — DIFFERENTIAL
Basophils Absolute: 0 10*3/uL (ref 0.0–0.1)
Basophils Relative: 1 % (ref 0–1)
Eosinophils Absolute: 0.4 10*3/uL (ref 0.0–0.7)
Eosinophils Relative: 7 % — ABNORMAL HIGH (ref 0–5)
Lymphocytes Relative: 31 % (ref 12–46)
Lymphs Abs: 1.6 10*3/uL (ref 0.7–4.0)
Monocytes Absolute: 0.5 10*3/uL (ref 0.1–1.0)
Monocytes Relative: 9 % (ref 3–12)
Neutro Abs: 2.7 10*3/uL (ref 1.7–7.7)
Neutrophils Relative %: 52 % (ref 43–77)

## 2010-11-14 LAB — POCT I-STAT TROPONIN I: Troponin i, poc: 0 ng/mL (ref 0.00–0.08)

## 2010-11-14 MED ORDER — IOHEXOL 300 MG/ML  SOLN
100.0000 mL | Freq: Once | INTRAMUSCULAR | Status: AC | PRN
  Administered 2010-11-14: 100 mL via INTRAVENOUS

## 2010-11-15 ENCOUNTER — Telehealth: Payer: Self-pay | Admitting: Cardiovascular Disease

## 2010-11-15 ENCOUNTER — Ambulatory Visit (INDEPENDENT_AMBULATORY_CARE_PROVIDER_SITE_OTHER): Payer: Self-pay | Admitting: Surgical

## 2010-11-15 VITALS — BP 146/84 | HR 80 | Resp 20 | Ht 70.0 in | Wt 184.0 lb

## 2010-11-15 DIAGNOSIS — I2510 Atherosclerotic heart disease of native coronary artery without angina pectoris: Secondary | ICD-10-CM

## 2010-11-15 DIAGNOSIS — Z951 Presence of aortocoronary bypass graft: Secondary | ICD-10-CM

## 2010-11-15 NOTE — Telephone Encounter (Signed)
Spoke with pt wife, they will ask dr gerhardt and if he wants them to see [place] she will let me know [name] [name]

## 2010-11-15 NOTE — Patient Instructions (Signed)
Coronary Artery Bypass Grafting Care After Refer to this sheet in the next few weeks. These instructions provide you with information on caring for yourself after your procedure. Your caregiver may also give you more specific instructions. Your treatment has been planned according to current medical practices, but problems sometimes occur. Call your caregiver if you have any problems or questions after your procedure.  Recovery from open heart surgery will be different for everyone. Some people feel well after 3 or 4 weeks, while for others it takes longer. After heart surgery, it may be normal to:  Not have an appetite, feel nauseated by the smell of food, or only want to eat a small amount.   Be constipated because of changes in your diet, activity, and medicines. Eat foods high in fiber. Add fresh fruits and vegetables to your diet. Stool softeners may be helpful.   Feel sad or unhappy. You may be frustrated or cranky. You may have good days and bad days. Do not give up. Talk to your caregiver if you do not feel better.   Feel weakness and fatigue. You many need physical therapy or cardiac rehabilitation to get your strength back.   Develop an irregular heartbeat called atrial fibrillation. Symptoms of atrial fibrillation are a fast, irregular heartbeat or feelings of fluttery heartbeats, shortness of breath, low blood pressure, and dizziness. If these symptoms develop, see your caregiver right away.  MEDICATION  Have a list of all the medicines you will be taking when you leave the hospital. For every medicine, know the following:   Name.   Exact dose.   Time of day to be taken.   How often it should be taken.   Why you are taking it.   Ask which medicines should or should not be taken together. If you take more than one heart medicine, ask if it is okay to take them together. Some heart medicines should not be taken at the same time because they may lower your blood pressure too  much.   Narcotic pain medicine can cause constipation. Eat fresh fruits and vegetables. Add fiber to your diet. Stool softener medicine may help relieve constipation.   Keep a copy of your medicines with you at all times.   Do not add or stop taking any medicine until you check with your caregiver.   Medicines can have side effects. Call your caregiver who prescribed the medicine if you:   Start throwing up, have diarrhea, or have stomach pain.   Feel dizzy or lightheaded when you stand up.   Feel your heart is skipping beats or is beating too fast or too slow.   Develop a rash.   Notice unusual bruising or bleeding.  HOME CARE INSTRUCTIONS  After heart surgery, it is important to learn how to take your pulse. Have your caregiver show you how to take your pulse.   Use your incentive spirometer. Ask your caregiver how long after surgery you need to use it.  Care of your chest incision  Tell your caregiver right away if you notice clicking in your chest (sternum).   Support your chest with a pillow or your arms when you take deep breaths and cough.   Follow your caregiver's instructions about when you can bathe or swim.   Protect your incision from sunlight during the first year to keep the scar from getting dark.   Tell your caregiver if you notice:   Increased tenderness of your incision.   Increased redness   or swelling around your incision.   Drainage or pus from your incision.  Care of your leg incision(s)  Avoid crossing your legs.   Avoid sitting for long periods of time. Change positions every half hour.   Elevate your leg(s) when you are sitting.   Check your leg(s) daily for swelling. Check the incisions for redness or drainage.   Wear your elastic stockings as told by your caregiver. Take them off at bedtime.  Diet  Diet is very important to heart health.   Eat plenty of fresh fruits and vegetables. Meats should be lean cut. Avoid canned, processed, and  fried foods.   Talk to a dietician. They can teach you how to make healthy food and drink choices.  Weight  Weigh yourself every day. This is important because it helps to know if you are retaining fluid that may make your heart and lungs work harder.   Use the same scale each time.   Weigh yourself every morning at the same time. You should do this after you go to the bathroom, but before you eat breakfast.   Your weight will be more accurate if you do not wear any clothes.   Record your weight.   Tell your caregiver if you have gained 2 pounds or more overnight.  Activity Stop any activity at once if you have chest pain, shortness of breath, irregular heartbeats, or dizziness. Get help right away if you have any of these symptoms.  Bathing.  Avoid soaking in a bath or hot tub until your incisions are healed.   Rest. You need a balance of rest and activity.   Exercise. Exercise per your caregiver's advice. You may need physical therapy or cardiac rehabilitation to help strengthen your muscles and build your endurance.   Climbing stairs. Unless your caregiver tells you not to climb stairs, go up stairs slowly and rest if you tire. Do not pull yourself up by the handrail.   Driving a car. Follow your caregiver's advice on when you may drive. You may ride as a passenger at any time. When traveling for long periods of time in a car, get out of the car and walk around for a few minutes every 2 hours.   Lifting. Avoid lifting, pushing, or pulling anything heavier than 10 pounds for 6 weeks after surgery or as told by your caregiver.   Returning to work. Check with your caregiver. People heal at different rates. Most people will be able to go back to work 6 to 12 weeks after surgery.   Sexual activity. You may resume sexual relations as told by your caregiver.  SEEK MEDICAL CARE IF:  Any of your incisions are red, painful, or have any type of drainage coming from them.   You have an  oral temperature above 102 F (38.9 C).   You have ankle or leg swelling.   You have pain in your legs.   You have weight gain of 2 or more pounds a day.   You feel dizzy or lightheaded when you stand up.  SEEK IMMEDIATE MEDICAL CARE IF:  You have angina or chest pain that goes to your jaw or arms. Call your local emergency services right away.   You have shortness of breath at rest or with activity.   You have a fast or irregular heartbeat (arrhythmia).   There is a "clicking" in your sternum when you move.   You have numbness or weakness in your arms or legs.    MAKE SURE YOU:  Understand these instructions.   Will watch your condition.   Will get help right away if you are not doing well or get worse.  Document Released: 07/16/2004 Document Revised: 09/08/2010 Document Reviewed: 03/03/2010 [employer] Patient Information 2012 [employer]. 

## 2010-11-15 NOTE — Progress Notes (Signed)
  HPI:  Patient returns for routine postoperative follow-up having undergone coronary artery bypass grafting x4 on 10/19/2010 per Dr. [name] for coronary occlusive disease with positive stress test and new onset angina. The patient's early postoperative recovery while in the hospital was notable for being quite unremarkable. Since hospital discharge the patient reports he did have an episode last Saturday night where he developed some left-sided lateral rib pain. He was seen in the emergency department where he underwent CT angiogram of the chest this showed no pulmonary embolism. He did have some mild atelectatic changes.   Current Outpatient Prescriptions  Medication Sig Dispense Refill  . acetaminophen (TYLENOL) 500 MG tablet Take 1,000 mg by mouth every 6 (six) hours as needed. pain      . aspirin 325 MG EC tablet Take 325 mg by mouth daily.        . clotrimazole-betamethasone (LOTRISONE) cream Apply topically 2 (two) times daily as needed.        . docusate sodium (COLACE) 100 MG capsule Take 100 mg by mouth daily. Stool softener      . folic acid (FOLVITE) 1 MG tablet Take 1 mg by mouth daily.        Marland Kitchen guaiFENesin-dextromethorphan (ROBITUSSIN DM) 100-10 MG/5ML syrup as needed. Cough or congestion      . ibuprofen (ADVIL,MOTRIN) 200 MG tablet Take 200 mg by mouth every 6 (six) hours as needed. pain      . metoprolol tartrate (LOPRESSOR) 25 MG tablet Take 25 mg by mouth 2 (two) times daily.        . nitroGLYCERIN (NITROSTAT) 0.4 MG SL tablet Place 1 tablet (0.4 mg total) under the tongue every 5 (five) minutes as needed for chest pain.  25 tablet  12  . oxyCODONE (OXY IR/ROXICODONE) 5 MG immediate release tablet Take 1 tablet (5 mg total) by mouth every 4 (four) hours as needed.  30 tablet  0  . Polyethylene Glycol 3350 (MIRALAX PO) Take 17 g/day by mouth daily. constipation      . senna (SENOKOT) 8.6 MG tablet Take 1 tablet by mouth daily. constipation      . simvastatin (ZOCOR) 40 MG  tablet TAKE 1 TABLET AT BEDTIME  30 tablet  11  . sodium chloride (OCEAN) 0.65 % nasal spray Place 2 sprays into the nose every 6 (six) hours as needed. Nasal congestion      . traMADol (ULTRAM) 50 MG tablet Take 50 mg by mouth every 6 (six) hours as needed. Maximum dose= 8 tablets per day For pain        Physical Exam general: Well-developed adult male in no acute distress. Cardiac: Regular rate and rhythm normal S1-S2 2/6 systolic murmur. Lungs: Clear lung fields bilaterally. Abdomen: Soft nontender. Extremities: Mild lower extremity edema. Incisions: Healing well without evidence of infection.  Diagnostic Tests: CT scan was reviewed with Dr. Cornelius Moras. We feel as though the findings are essentially unremarkable and consistent with postoperative state.   Impression: The patient is doing quite well. He will continue nonsteroidal anti-inflammatories for his rib pain as needed. I have instructed him on driving protocol. Additionally we discussed returning to work as well as lifting restrictions.  Plan: We will see the patient on a when necessary basis for any surgically related issues or at request of cardiologist the

## 2010-11-15 NOTE — Telephone Encounter (Signed)
New  Message:  Pt had by pass oct 9 and went to ED Sat with chest discomfort.  Diag with pleurisy and told to see Dr. Eden Emms.  He has an appt today with Dr. [name] and wants to know if that will be ok or does he still need to see Dr. Eden Emms.  Please call wife and let her know.

## 2010-11-22 ENCOUNTER — Encounter (HOSPITAL_COMMUNITY)
Admission: RE | Admit: 2010-11-22 | Discharge: 2010-11-22 | Payer: 59 | Source: Ambulatory Visit | Attending: Cardiovascular Disease | Admitting: Cardiovascular Disease

## 2010-11-22 DIAGNOSIS — E7800 Pure hypercholesterolemia, unspecified: Secondary | ICD-10-CM | POA: Insufficient documentation

## 2010-11-22 DIAGNOSIS — I059 Rheumatic mitral valve disease, unspecified: Secondary | ICD-10-CM | POA: Insufficient documentation

## 2010-11-22 DIAGNOSIS — I259 Chronic ischemic heart disease, unspecified: Secondary | ICD-10-CM | POA: Insufficient documentation

## 2010-11-22 DIAGNOSIS — I10 Essential (primary) hypertension: Secondary | ICD-10-CM | POA: Insufficient documentation

## 2010-11-22 DIAGNOSIS — E785 Hyperlipidemia, unspecified: Secondary | ICD-10-CM | POA: Insufficient documentation

## 2010-11-22 DIAGNOSIS — I319 Disease of pericardium, unspecified: Secondary | ICD-10-CM | POA: Insufficient documentation

## 2010-11-22 DIAGNOSIS — E669 Obesity, unspecified: Secondary | ICD-10-CM | POA: Insufficient documentation

## 2010-11-22 DIAGNOSIS — R072 Precordial pain: Secondary | ICD-10-CM | POA: Insufficient documentation

## 2010-11-22 DIAGNOSIS — Z5189 Encounter for other specified aftercare: Secondary | ICD-10-CM | POA: Insufficient documentation

## 2010-11-22 DIAGNOSIS — I079 Rheumatic tricuspid valve disease, unspecified: Secondary | ICD-10-CM | POA: Insufficient documentation

## 2010-11-22 DIAGNOSIS — Z87891 Personal history of nicotine dependence: Secondary | ICD-10-CM | POA: Insufficient documentation

## 2010-11-24 ENCOUNTER — Encounter (HOSPITAL_COMMUNITY): Payer: 59

## 2010-11-24 NOTE — Progress Notes (Signed)
[name] [name] 55 y.o. male       Nutrition Screen                                                                    YES  NO Do you live in a nursing home?  X   Do you eat out more than 3 times/week?    X If yes, how many times per week do you eat out?  Do you have food allergies?   X If yes, what are you allergic to?  Have you gained or lost more than 10 lbs without trying?               X If yes, how much weight have you lost and over what time period?  lbs gained or lost over  weeks/month  Do you want to lose weight?    X  If yes, what is a goal weight or amount of weight you would like to lose? 175 #  Do you eat alone most of the time?   X   Do you eat less than 2 meals/day?  X If yes, how many meals do you eat?  Do you drink more than 3 alcohol drinks/day?  X If yes, how many drinks per day?  Are you having trouble with constipation? *  X If yes, what are you doing to help relieve constipation?  Do you have financial difficulties with buying food?*    X   Are you experiencing regular nausea/ vomiting?*     X   Do you have a poor appetite? *                                        X   Do you have trouble chewing/swallowing? *   X    Pt with diagnoses of:  X CABG              X A1c > 6 X Dyslipidemia  / HDL< 40 / LDL>70 / High TG      X %  Body fat >goal / Body Mass Index >25 X HTN / BP >120/80       Pt Risk Score    1       Diagnosis Risk Score  70       Total Risk Score   71                          X High Risk                     Low Risk              HT: 69.25" Ht Readings from Last 1 Encounters:  11/15/10 5\' 10"  (1.778 m)    WT: 182.6 lb (83 kg) Wt Readings from Last 3 Encounters:  11/15/10 184 lb (83.462 kg)  11/05/10 182 lb (82.555 kg)  10/07/10 193 lb 1.9 oz (87.599 kg)     IBW 73 114%IBW BMI 26.8 29.3%body fat  has a current medication list which includes the following prescription(s): acetaminophen, aspirin, clotrimazole-betamethasone, docusate sodium,  folic acid, guaifenesin-dextromethorphan, ibuprofen, metoprolol tartrate, nitroglycerin, oxycodone, polyethylene glycol 3350, senna, simvastatin, sodium chloride, and tramadol. Past Medical History  Diagnosis Date  . Essential hypertension, benign   . Obesity   . Colon polyps   . Chest pain   . Hyperlipemia   . CAD (coronary artery disease)   . Tobacco abuse   . Heart murmur         Activity level: Pt is moderately active  Wt goal:160-175 lb (72.7-79.5 kg) Current tobacco use? No      Food/Drug Interaction? No  Labs:  Lab Results  Component Value Date   CHOL 129 09/24/2010   HDL 36.80* 09/24/2010   LDLCALC 78 09/24/2010   TRIG 69.0 09/24/2010   CHOLHDL 4 09/24/2010    Lab Results  Component Value Date   HGBA1C 6.4* 10/18/2010   11/14/10 Glucose 78   LDL goal: < 70      PVD and > 2:      HTN, HDL, > 55 yo male  Estimated Daily Nutrition Needs for: ? wt loss 1500-2000 Kcal , Total Fat 40-55gm, Saturated Fat 11-15 gm, Trans Fat 1.4-2.0 gm,  Sodium less than 1500 mg

## 2010-11-26 ENCOUNTER — Encounter (HOSPITAL_COMMUNITY)
Admission: RE | Admit: 2010-11-26 | Discharge: 2010-11-26 | Disposition: A | Discharge status: Home / Self Care | Payer: 59 | Source: Ambulatory Visit | Attending: Internal Medicine | Admitting: Internal Medicine

## 2010-11-26 NOTE — Progress Notes (Signed)
Reviewed home exercise with pt today.  Pt plans to walk at home outdoors for exercise.  Reviewed THR, pulse, RPE, sign and symptoms, NTG use, and when to call 911 or MD.  Pt voiced understanding.

## 2010-11-29 ENCOUNTER — Encounter (HOSPITAL_COMMUNITY)
Admission: RE | Admit: 2010-11-29 | Discharge: 2010-11-29 | Disposition: A | Discharge status: Home / Self Care | Payer: 59 | Source: Ambulatory Visit | Attending: Cardiovascular Disease | Admitting: Cardiovascular Disease

## 2010-11-29 NOTE — Progress Notes (Signed)
[name] [name] 56 y.o. male Nutrition Note  Spoke with pt.  Nutrition Plan reviewed with pt.  Weight loss tips discussed.  Pt's hemoglobin A1c/pre-diabetes discussed.  Pt unaware of pre-diabetic status.  Pt stated, "I thought there was some concern about diabetes because they kept sticking me and giving me insulin in the hospital."  This writer explained the post-op intensive insulin protocol. Pt encouraged to talk with his PCP re: pre-diabetes and regular screening for diabetes.  Pt expressed understanding.  Nutrition Diagnosis   Food-and nutrition-related knowledge deficit related to lack of exposure to information as related to diagnosis of: ? CVD ? Pre-DM (A1c 6.4)    Overweight related to excessive energy intake as evidenced by a BMI of 26.8  Nutrition RX/ Estimated Daily Nutrition Needs for: wt loss 1500-2000 Kcal, 40-55 gm fat, 11-15 gm sat fat, 1.4-2.0 gm trans-fat, <1500 mg sodium Nutrition Intervention   Pt's individual nutrition plan including cholesterol goals reviewed with pt.   Benefits of adopting Therapeutic Lifestyle Changes discussed when Medficts reviewed.   Pt to attend the Portion Distortion class   Pt to attend the  ? Nutrition I class                     ? Nutrition II class        ? Diabetes Blitz class                      ? Diabetes Q & A class - met   Pt given handouts for: ? wt loss ? pre-DM   Continue client-centered nutrition education by RD, as part of interdisciplinary care. Goal(s)   Pt to identify and limit food sources of saturated fat, trans fat, and cholesterol   Pt to identify food quantities necessary to achieve: ? wt loss to a goal wt of 160-175 lb at graduation from cardiac rehab.  Monitor and Evaluate progress toward nutrition goal with team.

## 2010-11-29 NOTE — Progress Notes (Signed)
Pt started cardiac rehab today.  Pt tolerated light exercise without difficulty.VSS, telemetry NSR with Twave inversion.  Will continue to monitor pt throughout rehab program-jrion,rn

## 2010-12-01 ENCOUNTER — Encounter (HOSPITAL_COMMUNITY)
Admission: RE | Admit: 2010-12-01 | Discharge: 2010-12-01 | Disposition: A | Discharge status: Home / Self Care | Payer: 59 | Source: Ambulatory Visit | Attending: Cardiovascular Disease | Admitting: Cardiovascular Disease

## 2010-12-03 ENCOUNTER — Encounter (HOSPITAL_COMMUNITY): Payer: 59

## 2010-12-06 ENCOUNTER — Encounter (HOSPITAL_COMMUNITY)
Admission: RE | Admit: 2010-12-06 | Discharge: 2010-12-06 | Disposition: A | Discharge status: Home / Self Care | Payer: 59 | Source: Ambulatory Visit | Attending: Cardiovascular Disease | Admitting: Cardiovascular Disease

## 2010-12-06 ENCOUNTER — Encounter: Payer: 59 | Admitting: Cardiology

## 2010-12-06 NOTE — Progress Notes (Signed)
Spoke with pt.  Pt nutrition survey reviewed.  There are many ways the pt can improve his diet to become heart healthier.  Pt expressed understanding.  Continue client-centered nutrition education by RD as part of interdisciplinary care.  Monitor and evaluate progress toward nutrition goal with team.

## 2010-12-08 ENCOUNTER — Telehealth: Payer: Self-pay | Admitting: Cardiovascular Disease

## 2010-12-08 ENCOUNTER — Encounter (HOSPITAL_COMMUNITY)
Admission: RE | Admit: 2010-12-08 | Discharge: 2010-12-08 | Disposition: A | Discharge status: Home / Self Care | Payer: 59 | Source: Ambulatory Visit | Attending: Cardiovascular Disease | Admitting: Cardiovascular Disease

## 2010-12-08 ENCOUNTER — Encounter (INDEPENDENT_AMBULATORY_CARE_PROVIDER_SITE_OTHER): Payer: 59 | Admitting: Cardiology

## 2010-12-08 DIAGNOSIS — I8290 Acute embolism and thrombosis of unspecified vein: Secondary | ICD-10-CM

## 2010-12-08 DIAGNOSIS — R0989 Other specified symptoms and signs involving the circulatory and respiratory systems: Secondary | ICD-10-CM

## 2010-12-08 NOTE — Telephone Encounter (Signed)
[name] [name] has paperwork and will get Dr Eden Emms to sign.

## 2010-12-08 NOTE — Telephone Encounter (Signed)
New problem Pt calling back about paperwork he left. It is for a supplemental policy not disability.

## 2010-12-10 ENCOUNTER — Encounter (HOSPITAL_COMMUNITY)
Admission: RE | Admit: 2010-12-10 | Discharge: 2010-12-10 | Disposition: A | Discharge status: Home / Self Care | Payer: 59 | Source: Ambulatory Visit | Attending: Cardiovascular Disease | Admitting: Cardiovascular Disease

## 2010-12-13 ENCOUNTER — Ambulatory Visit (INDEPENDENT_AMBULATORY_CARE_PROVIDER_SITE_OTHER): Payer: Self-pay | Admitting: Physician Assistant

## 2010-12-13 ENCOUNTER — Ambulatory Visit: Payer: 59

## 2010-12-13 ENCOUNTER — Encounter (HOSPITAL_COMMUNITY)
Admission: RE | Admit: 2010-12-13 | Discharge: 2010-12-13 | Disposition: A | Discharge status: Home / Self Care | Payer: 59 | Source: Ambulatory Visit | Attending: Cardiovascular Disease | Admitting: Cardiovascular Disease

## 2010-12-13 ENCOUNTER — Encounter: Payer: Self-pay | Admitting: *Deleted

## 2010-12-13 VITALS — BP 138/80 | HR 79 | Resp 18 | Ht 70.0 in | Wt 178.0 lb

## 2010-12-13 DIAGNOSIS — R072 Precordial pain: Secondary | ICD-10-CM | POA: Insufficient documentation

## 2010-12-13 DIAGNOSIS — I319 Disease of pericardium, unspecified: Secondary | ICD-10-CM | POA: Insufficient documentation

## 2010-12-13 DIAGNOSIS — Z87891 Personal history of nicotine dependence: Secondary | ICD-10-CM | POA: Insufficient documentation

## 2010-12-13 DIAGNOSIS — Z5189 Encounter for other specified aftercare: Secondary | ICD-10-CM | POA: Insufficient documentation

## 2010-12-13 DIAGNOSIS — E669 Obesity, unspecified: Secondary | ICD-10-CM | POA: Insufficient documentation

## 2010-12-13 DIAGNOSIS — E785 Hyperlipidemia, unspecified: Secondary | ICD-10-CM | POA: Insufficient documentation

## 2010-12-13 DIAGNOSIS — E7800 Pure hypercholesterolemia, unspecified: Secondary | ICD-10-CM | POA: Insufficient documentation

## 2010-12-13 DIAGNOSIS — I10 Essential (primary) hypertension: Secondary | ICD-10-CM | POA: Insufficient documentation

## 2010-12-13 DIAGNOSIS — I079 Rheumatic tricuspid valve disease, unspecified: Secondary | ICD-10-CM | POA: Insufficient documentation

## 2010-12-13 DIAGNOSIS — I059 Rheumatic mitral valve disease, unspecified: Secondary | ICD-10-CM | POA: Insufficient documentation

## 2010-12-13 DIAGNOSIS — I2510 Atherosclerotic heart disease of native coronary artery without angina pectoris: Secondary | ICD-10-CM

## 2010-12-13 DIAGNOSIS — I259 Chronic ischemic heart disease, unspecified: Secondary | ICD-10-CM | POA: Insufficient documentation

## 2010-12-13 NOTE — Progress Notes (Signed)
HPI: The patient comes in today for evaluation of a small area of his sternal incision. He is status post coronary artery bypass grafting x4 on 10/19/2010 by Dr. [name]. He has been seen previously in our office and has been discharged from our care. He was seen in cardiac rehabilitation today and they noted what appeared to be suture material at the uppermost part of his sternal incision with a small amount of drainage just below it. He has not previously had problems with his incision and has had no erythema around the area or fevers.  Current Outpatient Prescriptions  Medication Sig Dispense Refill  . acetaminophen (TYLENOL) 500 MG tablet Take 1,000 mg by mouth every 6 (six) hours as needed. pain      . aspirin 325 MG EC tablet Take 325 mg by mouth daily.        . clotrimazole-betamethasone (LOTRISONE) cream Apply topically 2 (two) times daily as needed.        . docusate sodium (COLACE) 100 MG capsule Take 100 mg by mouth 2 (two) times daily as needed. Stool softener      . folic acid (FOLVITE) 1 MG tablet Take 1 mg by mouth daily.        Marland Kitchen guaiFENesin-dextromethorphan (ROBITUSSIN DM) 100-10 MG/5ML syrup as needed. Cough or congestion      . ibuprofen (ADVIL,MOTRIN) 200 MG tablet Take 200 mg by mouth every 6 (six) hours as needed. pain      . metoprolol tartrate (LOPRESSOR) 25 MG tablet Take 25 mg by mouth 2 (two) times daily.        . nitroGLYCERIN (NITROSTAT) 0.4 MG SL tablet Place 1 tablet (0.4 mg total) under the tongue every 5 (five) minutes as needed for chest pain.  25 tablet  12  . Polyethylene Glycol 3350 (MIRALAX PO) Take 17 g/day by mouth daily. constipation      . simvastatin (ZOCOR) 40 MG tablet TAKE 1 TABLET AT BEDTIME  30 tablet  11  . sodium chloride (OCEAN) 0.65 % nasal spray Place 2 sprays into the nose every 6 (six) hours as needed. Nasal congestion      . traMADol (ULTRAM) 50 MG tablet Take 50 mg by mouth every 6 (six) hours as needed. Maximum dose= 8 tablets per day For  pain      . oxyCODONE (OXY IR/ROXICODONE) 5 MG immediate release tablet Take 1 tablet (5 mg total) by mouth every 4 (four) hours as needed.  30 tablet  0  . senna (SENOKOT) 8.6 MG tablet Take 1 tablet by mouth daily. constipation         Physical Exam: The uppermost portion of the sternal incision has healed well, however there is a small piece of Vicryl suture material which is protruding from the incision. This was trimmed without problem. A tiny area just below this appears to have been previously scabbed over and this Has now fallen off. I am unable to express any drainage whatsoever. There is no erythema or sternal instability.  Impression:  protruding suture material from sternal incision  Plan: Continue to keep the area clean and dry. Call if he develops erythema drainage fevers or chills. Otherwise followup when necessary.

## 2010-12-13 NOTE — Progress Notes (Signed)
Pt arrived to cardiac rehab reporting new onset of scabbing on his sternal incision and mild drainage noted this morning.  Upon inspection, eschar present mid incision, no redness, swelling, tenderness noted.  appt scheduled with Dr. [name] today at 1:15 for evaluation-jr,rn

## 2010-12-15 ENCOUNTER — Encounter (HOSPITAL_COMMUNITY)
Admission: RE | Admit: 2010-12-15 | Discharge: 2010-12-15 | Disposition: A | Discharge status: Home / Self Care | Payer: 59 | Source: Ambulatory Visit | Attending: Cardiovascular Disease | Admitting: Cardiovascular Disease

## 2010-12-16 ENCOUNTER — Telehealth: Payer: Self-pay | Admitting: *Deleted

## 2010-12-16 ENCOUNTER — Encounter: Payer: Self-pay | Admitting: Cardiovascular Disease

## 2010-12-16 ENCOUNTER — Ambulatory Visit (INDEPENDENT_AMBULATORY_CARE_PROVIDER_SITE_OTHER): Payer: 59 | Admitting: Cardiovascular Disease

## 2010-12-16 DIAGNOSIS — Z9889 Other specified postprocedural states: Secondary | ICD-10-CM

## 2010-12-16 DIAGNOSIS — Z951 Presence of aortocoronary bypass graft: Secondary | ICD-10-CM

## 2010-12-16 DIAGNOSIS — E7800 Pure hypercholesterolemia, unspecified: Secondary | ICD-10-CM

## 2010-12-16 DIAGNOSIS — I10 Essential (primary) hypertension: Secondary | ICD-10-CM

## 2010-12-16 NOTE — Assessment & Plan Note (Signed)
Healing well hematoma on GSV improved.  F/U CVTS

## 2010-12-16 NOTE — Telephone Encounter (Signed)
PT PHONED IN EARLIER FORGOT TO ASK DR NISHAN  WHAT WEIGHT RESTRICTIONS WERE IN PLACE  DISCUSSED WITH DR Eden Emms   PT NOT TO LIFT NO MORE THAN 25-30 POUNDS./CY  PT AWARE OF ABOVE .Zack Seal

## 2010-12-16 NOTE — Assessment & Plan Note (Signed)
Cholesterol is at goal.  Continue current dose of statin and diet Rx.  No myalgias or side effects.  F/U  LFT's in 6 months. Lab Results  Component Value Date   LDLCALC 78 09/24/2010             

## 2010-12-16 NOTE — Patient Instructions (Signed)
Your physician wants you to follow-up in:  6 MONTHS WITH DR NISHAN  You will receive a reminder letter in the mail two months in advance. If you don't receive a letter, please call our office to schedule the follow-up appointment. Your physician recommends that you continue on your current medications as directed. Please refer to the Current Medication list given to you today. 

## 2010-12-16 NOTE — Progress Notes (Signed)
S/P CABG 10/19/10  No issues.  Doing well at cardiac rehab.  Had hematoma in GSV harvest site in right inner thigh.  Reviewed last two duplex and improved.  No threat to PE as not DVT.  Likely bleeding from branch vessels.  Told him he couldn't ride motorcycle until April.  Has nitro.  Compliant with meds.  Will take fishh oil.  ROS: Denies fever, malais, weight loss, blurry vision, decreased visual acuity, cough, sputum, SOB, hemoptysis, pleuritic pain, palpitaitons, heartburn, abdominal pain, melena, lower extremity edema, claudication, or rash.  All other systems reviewed and negative  General: Affect appropriate Healthy:  appears stated age HEENT: normal Neck supple with no adenopathy JVP normal no bruits no thyromegaly Lungs clear with no wheezing and good diaphragmatic motion Heart:  S1/S2 no murmur,rub, gallop or click PMI normal Abdomen: benighn, BS positve, no tenderness, no AAA no bruit.  No HSM or HJR Distal pulses intact with no bruits No edema Neuro non-focal Skin warm and dry No muscular weakness   Current Outpatient Prescriptions  Medication Sig Dispense Refill  . acetaminophen (TYLENOL) 500 MG tablet Take 1,000 mg by mouth every 6 (six) hours as needed. pain      . aspirin 325 MG EC tablet Take 325 mg by mouth daily.        . clotrimazole-betamethasone (LOTRISONE) cream Apply topically 2 (two) times daily as needed.        . docusate sodium (COLACE) 100 MG capsule Take 100 mg by mouth daily. Stool softener      . folic acid (FOLVITE) 1 MG tablet Take 1 mg by mouth daily.        Marland Kitchen guaiFENesin-dextromethorphan (ROBITUSSIN DM) 100-10 MG/5ML syrup as needed. Cough or congestion      . ibuprofen (ADVIL,MOTRIN) 200 MG tablet Take 200 mg by mouth every 6 (six) hours as needed. pain      . metoprolol tartrate (LOPRESSOR) 25 MG tablet Take 25 mg by mouth 2 (two) times daily.        . nitroGLYCERIN (NITROSTAT) 0.4 MG SL tablet Place 1 tablet (0.4 mg total) under the tongue every  5 (five) minutes as needed for chest pain.  25 tablet  12  . oxyCODONE (OXY IR/ROXICODONE) 5 MG immediate release tablet Take 1 tablet (5 mg total) by mouth every 4 (four) hours as needed.  30 tablet  0  . Polyethylene Glycol 3350 (MIRALAX PO) Take 17 g/day by mouth daily. constipation      . senna (SENOKOT) 8.6 MG tablet Take 1 tablet by mouth daily. constipation      . simvastatin (ZOCOR) 40 MG tablet TAKE 1 TABLET AT BEDTIME  30 tablet  11  . sodium chloride (OCEAN) 0.65 % nasal spray Place 2 sprays into the nose every 6 (six) hours as needed. Nasal congestion      . traMADol (ULTRAM) 50 MG tablet Take 50 mg by mouth every 6 (six) hours as needed. Maximum dose= 8 tablets per day For pain        Allergies  Codeine  Electrocardiogram:  Assessment and Plan

## 2010-12-16 NOTE — Assessment & Plan Note (Signed)
Well controlled.  Continue current medications and low sodium Dash type diet.    

## 2010-12-17 ENCOUNTER — Encounter (HOSPITAL_COMMUNITY)
Admission: RE | Admit: 2010-12-17 | Discharge: 2010-12-17 | Disposition: A | Discharge status: Home / Self Care | Payer: 59 | Source: Ambulatory Visit | Attending: Cardiovascular Disease | Admitting: Cardiovascular Disease

## 2010-12-20 ENCOUNTER — Encounter (HOSPITAL_COMMUNITY)
Admission: RE | Admit: 2010-12-20 | Discharge: 2010-12-20 | Disposition: A | Discharge status: Home / Self Care | Payer: 59 | Source: Ambulatory Visit | Attending: Cardiovascular Disease | Admitting: Cardiovascular Disease

## 2010-12-22 ENCOUNTER — Encounter (HOSPITAL_COMMUNITY)
Admission: RE | Admit: 2010-12-22 | Discharge: 2010-12-22 | Disposition: A | Discharge status: Home / Self Care | Payer: 59 | Source: Ambulatory Visit | Attending: Cardiovascular Disease | Admitting: Cardiovascular Disease

## 2010-12-24 ENCOUNTER — Encounter: Payer: Self-pay | Admitting: *Deleted

## 2010-12-24 ENCOUNTER — Encounter (HOSPITAL_COMMUNITY)
Admission: RE | Admit: 2010-12-24 | Discharge: 2010-12-24 | Disposition: A | Discharge status: Home / Self Care | Payer: 59 | Source: Ambulatory Visit | Attending: Cardiovascular Disease | Admitting: Cardiovascular Disease

## 2010-12-27 ENCOUNTER — Encounter (HOSPITAL_COMMUNITY)
Admission: RE | Admit: 2010-12-27 | Discharge: 2010-12-27 | Disposition: A | Discharge status: Home / Self Care | Payer: 59 | Source: Ambulatory Visit | Attending: Cardiovascular Disease | Admitting: Cardiovascular Disease

## 2010-12-27 ENCOUNTER — Telehealth: Payer: Self-pay | Admitting: Internal Medicine

## 2010-12-27 NOTE — Telephone Encounter (Signed)
Error.  Duplicate message.  [name] [name] °- °

## 2010-12-27 NOTE — Telephone Encounter (Signed)
ATC NA unable to leave VM WCB 

## 2010-12-28 MED ORDER — METOPROLOL TARTRATE 25 MG PO TABS: 25.0000 mg | ORAL_TABLET | Freq: Two times a day (BID) | ORAL | Status: DC

## 2010-12-28 NOTE — Telephone Encounter (Signed)
Pt called back. Says to try his cell # again then as he says there is no problem with it that he knows of. Also call [phone]. Hazel Sams

## 2010-12-28 NOTE — Telephone Encounter (Signed)
Rx has been sent and pt is aware of this. Nothing further was needed

## 2010-12-29 ENCOUNTER — Encounter (HOSPITAL_COMMUNITY)
Admission: RE | Admit: 2010-12-29 | Discharge: 2010-12-29 | Disposition: A | Discharge status: Home / Self Care | Payer: 59 | Source: Ambulatory Visit | Attending: Cardiovascular Disease | Admitting: Cardiovascular Disease

## 2010-12-31 ENCOUNTER — Encounter (HOSPITAL_COMMUNITY)
Admission: RE | Admit: 2010-12-31 | Discharge: 2010-12-31 | Disposition: A | Discharge status: Home / Self Care | Payer: 59 | Source: Ambulatory Visit | Attending: Cardiovascular Disease | Admitting: Cardiovascular Disease

## 2011-01-03 ENCOUNTER — Ambulatory Visit (INDEPENDENT_AMBULATORY_CARE_PROVIDER_SITE_OTHER): Payer: 59 | Admitting: Internal Medicine

## 2011-01-03 ENCOUNTER — Encounter (HOSPITAL_COMMUNITY): Payer: 59

## 2011-01-03 ENCOUNTER — Encounter: Payer: Self-pay | Admitting: Internal Medicine

## 2011-01-03 VITALS — BP 130/80 | HR 60 | Temp 98.5°F | Ht 69.0 in | Wt 184.8 lb

## 2011-01-03 DIAGNOSIS — I10 Essential (primary) hypertension: Secondary | ICD-10-CM

## 2011-01-03 DIAGNOSIS — E7800 Pure hypercholesterolemia, unspecified: Secondary | ICD-10-CM

## 2011-01-03 MED ORDER — VALSARTAN 160 MG PO TABS: 160.0000 mg | ORAL_TABLET | Freq: Every day | ORAL | Status: DC

## 2011-01-03 NOTE — Assessment & Plan Note (Signed)
Adequate control on present rx, reviewed  

## 2011-01-03 NOTE — Progress Notes (Deleted)
Patient ID: [name] [name], male   DOB: January 09, 1956, 55 y.o.   MRN: [id number] Subjective:     Patient ID: [name] [name], male   DOB: 09/19/1955, 55 y.o.   MRN: [id number]  HPI  54 yobm quit smoking in early 1980's with hypertension hyperlipidemia and atypical chest pain with a left heart catheter showing 60% obstruction of the right-sided branches in the 1999 and a negative stress study in May of 2007.   November 20, 2007 ov to refill on vytorin with last ldl 101, no change in rx   July 17, 2008 cpx 2.8 miles power walk q am without difficulty    01/03/2011 ov/[name] [name]  Cc new onset cp comes and goes x 1 week so went to ER July 30th with neg w/u and much less cp since er.  Location = Left of sternum only, aching but   No relation to activity, lasts no more than a few seconds, not noticeable supine or sleeping.  Does ok with exercise with no pains.    Pt denies any significant sore throat, dysphagia, itching, sneezing,  nasal congestion or excess/ purulent secretions,  fever, chills, sweats, unintended wt loss, clasically pleuritic cp, hempoptysis, orthopnea pnd or leg swelling.    Also denies any obvious fluctuation of symptoms with weather or environmental changes or other aggravating or alleviating factors.     Allergies  1) ! Codeine    Past Medical History:  HYPERTENSION, BENIGN ESSENTIAL, CONTROLLED (ICD-401.1)  OBESITY  - Target wt = 190 for BMI < 30  Colon polyps....................................................................................................[name]  - colonoscopy 4/04, letter sent 05/15/07 reviewed with pt July 17, 2008 and August 03, 2009 > referred back to GI  CHEST PAIN (ICD-786.50)  - IHD with 60% obstruction 1999 RCA negative stress Cardiolite May 2007  HYPERLIPIDEMIA  - target LDL less than 70 based on documented ischemic heart disease  HEALTH MAINTENANCE..........................................................................................[name]  - TD  April 2009  - CPX August 03, 2009   Family History:  hypertension in mother and father no premature heart disease in any direct relatives nor stroke diabetes or cancer, except that his mother has developed leukemia  1 brother healthy   Social History:  quit smoking about 1983 denies alcohol use. 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  wt 201 November 20, 2007 > 205 April 18, 2008 > 201 July 17, 2008 >   > 192 August 03, 2009 > 01/03/2011  HEENT: nl dentition, and orophanx. Severe tubinate edema R > L with eythema, no def purulent secretions or polyps  Neck without JVD/Nodes/TM. carotid upstrokes brisk without bruits  Lungs clear to A and P bilaterally without cough on insp or exp maneuvers  RRR no s3 or murmur or increase in P2. no edema  Abd soft and benign with nl excursion in the supine position.  Ext warm without calf tenderness, cyanosis clubbing  PT >> DP bilaterally   Assessment:         Plan:

## 2011-01-03 NOTE — Patient Instructions (Signed)
Add diovan 160 mg one daily to reduce your longterm risk of complications from atheroslerosis.    Please schedule a follow up visit in 3 months but call sooner if needed - need to be fasting on return

## 2011-01-03 NOTE — Assessment & Plan Note (Signed)
Adequate control on present rx, reviewed needs higher HDL via wt loss/ ex discussed

## 2011-01-03 NOTE — Progress Notes (Signed)
Patient ID: [name] [name], male   DOB: 1955/11/01, 55 y.o.   MRN: [id number] Subjective:     Patient ID: [name] [name], male   DOB: 1955/09/05, 55 y.o.   MRN: [id number]  HPI  54 yobm quit smoking in early 1980's with hypertension hyperlipidemia and atypical chest pain with a left heart catheter showing 60% obstruction of the right-sided branches in the 1999 and a negative stress study in May of 2007.   November 20, 2007 ov to refill on vytorin with last ldl 101, no change in rx   July 17, 2008 cpx 2.8 miles power walk q am without difficulty    08/12/2010 ov/[name] [name]  Cc new onset cp comes and goes x 1 week so went to ER July 30th with neg w/u and much less cp since er.  Location = Left of sternum only, aching but   No relation to activity, lasts no more than a few seconds, not noticeable supine or sleeping.  Does ok with exercise with no pains. rec Cardiac eval    DATE OF ADMISSION: 10/18/2010  DATE OF DISCHARGE: 10/24/2010  DISCHARGE SUMMARY  PRIMARY ADMITTING DIAGNOSIS: Chest pain.  ADDITIONAL/DISCHARGE DIAGNOSES:  1. Coronary artery disease.  2. New-onset angina.  3. Hypertension.  4. Hyperlipidemia.  5. Remote history of tobacco abuse.  6. Postoperative acute blood loss anemia.  PROCEDURES PERFORMED:  1. Cardiac catheterization.  2. Coronary artery bypass grafting x4 (left internal mammary artery to  the LAD, saphenous vein graft to the second diagonal, saphenous  vein graft to the first obtuse marginal, saphenous vein graft to  the posterior descending).  3. Endoscopic vein harvest, right leg    01/03/2011 post hosp f/u ov/[name] [name] ccPatient states doing good. Denies sob, cough, chest pain,and chest tightness.  Pt denies any significant sore throat, dysphagia, itching, sneezing,  nasal congestion or excess/ purulent secretions,  fever, chills, sweats, unintended wt loss, clasically pleuritic cp, hempoptysis, orthopnea pnd or leg swelling.    Also denies any obvious  fluctuation of symptoms with weather or environmental changes or other aggravating or alleviating factors.     Allergies  1) ! Codeine    Past Medical History:  HYPERTENSION, BENIGN ESSENTIAL, CONTROLLED (ICD-401.1)  OBESITY  - Target wt = 190 for BMI < 30  Colon polyps....................................................................................................[name]  - colonoscopy 4/04, letter sent 05/15/07 reviewed with pt July 17, 2008 and August 03, 2009 > referred back to GI  CHEST PAIN (ICD-786.50)  - s/p cabg 10/9 12  HYPERLIPIDEMIA  - target LDL less than 70 based on documented ischemic heart disease  HEALTH MAINTENANCE..........................................................................................[name]  - TD April 2009  - CPX  09/23/10   Family History:  hypertension in mother and father no premature heart disease in any direct relatives nor stroke diabetes or cancer, except that his mother has developed leukemia  1 brother healthy   Social History:  quit smoking about 1983 denies alcohol use. 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  wt 201 November 20, 2007 > 205 April 18, 2008 > 201 July 17, 2008 >  184 01/03/2011   HEENT: nl dentition, and orophanx. Severe tubinate edema R > L with eythema, no def purulent secretions or polyps  Neck without JVD/Nodes/TM. carotid upstrokes brisk without bruits  Lungs clear to A and P bilaterally without  cough on insp or exp maneuvers  RRR no s3 or murmur or increase in P2. no edema  Abd soft and benign with nl excursion in the supine position.  Ext warm without calf tenderness, cyanosis clubbing  PT >> DP bilaterally   Assessment:         Plan:

## 2011-01-05 ENCOUNTER — Encounter (HOSPITAL_COMMUNITY): Payer: 59

## 2011-01-07 ENCOUNTER — Encounter (HOSPITAL_COMMUNITY): Payer: 59

## 2011-01-10 ENCOUNTER — Encounter (HOSPITAL_COMMUNITY): Payer: 59

## 2011-01-10 ENCOUNTER — Telehealth: Payer: Self-pay | Admitting: Internal Medicine

## 2011-01-10 MED ORDER — DOXYCYCLINE HYCLATE 100 MG PO TABS: 100.0000 mg | ORAL_TABLET | Freq: Two times a day (BID) | ORAL | Status: AC

## 2011-01-10 NOTE — Telephone Encounter (Signed)
Doxycyline 100mg  bid x 10 days then ov if not 100% better

## 2011-01-10 NOTE — Telephone Encounter (Signed)
I spoke with pt and she c/o sore throat, PND, sinus congestion, laryngitis, sinus pressure, dry cough x Saturday. Pt denies any fever, nausea, vomiting, body aches. Pt has been taking corcidin and tylenol. Pt is requesting recs from Dr. Sherene Sires. Please advise, thanks  Allergies  Allergen Reactions  . Codeine     REACTION: migraines

## 2011-01-10 NOTE — Telephone Encounter (Signed)
I spoke with pt and is aware of MW recs. He is aware rx for doxycyline has been called into [facility]. Pt aware he needs OV if not 100% better

## 2011-01-12 ENCOUNTER — Encounter (HOSPITAL_COMMUNITY): Payer: 59

## 2011-01-12 DIAGNOSIS — I059 Rheumatic mitral valve disease, unspecified: Secondary | ICD-10-CM | POA: Insufficient documentation

## 2011-01-12 DIAGNOSIS — Z87891 Personal history of nicotine dependence: Secondary | ICD-10-CM | POA: Insufficient documentation

## 2011-01-12 DIAGNOSIS — E785 Hyperlipidemia, unspecified: Secondary | ICD-10-CM | POA: Insufficient documentation

## 2011-01-12 DIAGNOSIS — I079 Rheumatic tricuspid valve disease, unspecified: Secondary | ICD-10-CM | POA: Insufficient documentation

## 2011-01-12 DIAGNOSIS — Z5189 Encounter for other specified aftercare: Secondary | ICD-10-CM | POA: Insufficient documentation

## 2011-01-12 DIAGNOSIS — E7800 Pure hypercholesterolemia, unspecified: Secondary | ICD-10-CM | POA: Insufficient documentation

## 2011-01-12 DIAGNOSIS — I319 Disease of pericardium, unspecified: Secondary | ICD-10-CM | POA: Insufficient documentation

## 2011-01-12 DIAGNOSIS — R072 Precordial pain: Secondary | ICD-10-CM | POA: Insufficient documentation

## 2011-01-12 DIAGNOSIS — I10 Essential (primary) hypertension: Secondary | ICD-10-CM | POA: Insufficient documentation

## 2011-01-12 DIAGNOSIS — I259 Chronic ischemic heart disease, unspecified: Secondary | ICD-10-CM | POA: Insufficient documentation

## 2011-01-12 DIAGNOSIS — E669 Obesity, unspecified: Secondary | ICD-10-CM | POA: Insufficient documentation

## 2011-01-14 ENCOUNTER — Encounter (HOSPITAL_COMMUNITY)
Admission: RE | Admit: 2011-01-14 | Discharge: 2011-01-14 | Disposition: A | Discharge status: Home / Self Care | Payer: 59 | Source: Ambulatory Visit | Attending: Cardiovascular Disease | Admitting: Cardiovascular Disease

## 2011-01-17 ENCOUNTER — Encounter (HOSPITAL_COMMUNITY)
Admission: RE | Admit: 2011-01-17 | Discharge: 2011-01-17 | Disposition: A | Discharge status: Home / Self Care | Payer: 59 | Source: Ambulatory Visit | Attending: Cardiovascular Disease | Admitting: Cardiovascular Disease

## 2011-01-19 ENCOUNTER — Encounter (HOSPITAL_COMMUNITY)
Admission: RE | Admit: 2011-01-19 | Discharge: 2011-01-19 | Disposition: A | Discharge status: Home / Self Care | Payer: 59 | Source: Ambulatory Visit | Attending: Cardiovascular Disease | Admitting: Cardiovascular Disease

## 2011-01-21 ENCOUNTER — Encounter (HOSPITAL_COMMUNITY)
Admission: RE | Admit: 2011-01-21 | Discharge: 2011-01-21 | Disposition: A | Discharge status: Home / Self Care | Payer: 59 | Source: Ambulatory Visit | Attending: Cardiovascular Disease | Admitting: Cardiovascular Disease

## 2011-01-24 ENCOUNTER — Encounter (HOSPITAL_COMMUNITY)
Admission: RE | Admit: 2011-01-24 | Discharge: 2011-01-24 | Disposition: A | Discharge status: Home / Self Care | Payer: 59 | Source: Ambulatory Visit | Attending: Cardiovascular Disease | Admitting: Cardiovascular Disease

## 2011-01-26 ENCOUNTER — Encounter (HOSPITAL_COMMUNITY)
Admission: RE | Admit: 2011-01-26 | Discharge: 2011-01-26 | Disposition: A | Discharge status: Home / Self Care | Payer: 59 | Source: Ambulatory Visit | Attending: Cardiovascular Disease | Admitting: Cardiovascular Disease

## 2011-01-28 ENCOUNTER — Encounter (HOSPITAL_COMMUNITY): Payer: 59

## 2011-01-31 ENCOUNTER — Encounter (HOSPITAL_COMMUNITY)
Admission: RE | Admit: 2011-01-31 | Discharge: 2011-01-31 | Disposition: A | Discharge status: Home / Self Care | Payer: 59 | Source: Ambulatory Visit | Attending: Cardiovascular Disease | Admitting: Cardiovascular Disease

## 2011-01-31 NOTE — Progress Notes (Signed)
  [name] [name] 56 y.o. male Time Spent: 30 minutes  Nutrition Note  Lab Results  Component Value Date   HGBA1C 6.4* 10/18/2010   Wt Readings from Last 3 Encounters:  01/03/11 184 lb 12.8 oz (83.825 kg)  12/13/10 178 lb (80.74 kg)  11/15/10 184 lb (83.462 kg)    56 y.o. yo male who is overweight ( 114%IBW, BMI 26.8 ).  Pt wt today 82.3 kg, which is down 1.5 kg over the past month.  Wt loss appropriate and rate of wt loss appears safe. Pt eats 3 meals daily.  Most meals prepared at home.  Pt eats out "at least 3 times a week".  Chooses low-fat food the majority of the time.  Pt chooses heart healthy fats most of the time.  Pt uses 1% milk and does not eat cheese regularly.  Avoids most salt food.  Pt chooses fresh or frozen vegetables frequently and occasionally chooses convenience food (e.g. Lean Cuisine).  Pt is consuming at least half the grains consumed as whole-grains.  Pt lipid panel and goals for lipids previously discussed.  Pt diet is heart healthy with a little room for improvement.  Pt has attended the Nutrition 1 class and plans on attending the Nutrition 2 class 02/08/11.  Pt is pre-diabetic.  Per discussion with pt, his A1c has not been re-drawn since he started Cardiac Rehab. Nutrition Diagnosis   Food-and nutrition-related knowledge deficit related to lack of exposure to information as related to diagnosis of: ? CVD ? Pre-DM (A1c 6.4)    Overweight related to excessive energy intake as evidenced by a BMI of 26.8  Nutrition RX/ Estimated Daily Nutrition Needs for: wt loss 1500-2000 Kcal, 40-55 gm fat, 11-15 gm sat fat, 1.4-2.0 gm trans-fat, <1500 mg sodium Nutrition Intervention   Pt's individual nutrition plan reviewed with pt.   Pt to attend the Portion Distortion class - met 12/01/10 and 01/26/11   Pt to attend the  ? Nutrition I class - met 12/14/10                    ? Nutrition II class - pending 02/08/11        ? Diabetes Blitz class - not met                      ?  Diabetes Q & A class - met 11/16/10 and 12/31/10   Continue client-centered nutrition education by RD, as part of interdisciplinary care. Goal(s)   Pt to identify and limit food sources of saturated fat, trans fat, and cholesterol - met; discontinue   Pt to identify food quantities necessary to achieve: ? wt loss to a goal wt of 160-175 lb at graduation from cardiac rehab. - continue Monitor and Evaluate progress toward nutrition goal with team.

## 2011-02-02 ENCOUNTER — Encounter (HOSPITAL_COMMUNITY)
Admission: RE | Admit: 2011-02-02 | Discharge: 2011-02-02 | Disposition: A | Discharge status: Home / Self Care | Payer: 59 | Source: Ambulatory Visit | Attending: Cardiovascular Disease | Admitting: Cardiovascular Disease

## 2011-02-04 ENCOUNTER — Encounter (HOSPITAL_COMMUNITY)
Admission: RE | Admit: 2011-02-04 | Discharge: 2011-02-04 | Disposition: A | Discharge status: Home / Self Care | Payer: 59 | Source: Ambulatory Visit | Attending: Cardiovascular Disease | Admitting: Cardiovascular Disease

## 2011-02-07 ENCOUNTER — Encounter (HOSPITAL_COMMUNITY)
Admission: RE | Admit: 2011-02-07 | Discharge: 2011-02-07 | Disposition: A | Discharge status: Home / Self Care | Payer: 59 | Source: Ambulatory Visit | Attending: Cardiovascular Disease | Admitting: Cardiovascular Disease

## 2011-02-09 ENCOUNTER — Encounter (HOSPITAL_COMMUNITY)
Admission: RE | Admit: 2011-02-09 | Discharge: 2011-02-09 | Disposition: A | Discharge status: Home / Self Care | Payer: 59 | Source: Ambulatory Visit | Attending: Cardiovascular Disease | Admitting: Cardiovascular Disease

## 2011-02-11 ENCOUNTER — Encounter (HOSPITAL_COMMUNITY)
Admission: RE | Admit: 2011-02-11 | Discharge: 2011-02-11 | Disposition: A | Discharge status: Home / Self Care | Payer: 59 | Source: Ambulatory Visit | Attending: Cardiovascular Disease | Admitting: Cardiovascular Disease

## 2011-02-11 DIAGNOSIS — E7800 Pure hypercholesterolemia, unspecified: Secondary | ICD-10-CM | POA: Insufficient documentation

## 2011-02-11 DIAGNOSIS — Z87891 Personal history of nicotine dependence: Secondary | ICD-10-CM | POA: Insufficient documentation

## 2011-02-11 DIAGNOSIS — R072 Precordial pain: Secondary | ICD-10-CM | POA: Insufficient documentation

## 2011-02-11 DIAGNOSIS — I059 Rheumatic mitral valve disease, unspecified: Secondary | ICD-10-CM | POA: Insufficient documentation

## 2011-02-11 DIAGNOSIS — I079 Rheumatic tricuspid valve disease, unspecified: Secondary | ICD-10-CM | POA: Insufficient documentation

## 2011-02-11 DIAGNOSIS — I319 Disease of pericardium, unspecified: Secondary | ICD-10-CM | POA: Insufficient documentation

## 2011-02-11 DIAGNOSIS — E669 Obesity, unspecified: Secondary | ICD-10-CM | POA: Insufficient documentation

## 2011-02-11 DIAGNOSIS — I259 Chronic ischemic heart disease, unspecified: Secondary | ICD-10-CM | POA: Insufficient documentation

## 2011-02-11 DIAGNOSIS — E785 Hyperlipidemia, unspecified: Secondary | ICD-10-CM | POA: Insufficient documentation

## 2011-02-11 DIAGNOSIS — Z5189 Encounter for other specified aftercare: Secondary | ICD-10-CM | POA: Insufficient documentation

## 2011-02-11 DIAGNOSIS — I10 Essential (primary) hypertension: Secondary | ICD-10-CM | POA: Insufficient documentation

## 2011-02-14 ENCOUNTER — Encounter (HOSPITAL_COMMUNITY)
Admission: RE | Admit: 2011-02-14 | Discharge: 2011-02-14 | Disposition: A | Discharge status: Home / Self Care | Payer: 59 | Source: Ambulatory Visit | Attending: Cardiovascular Disease | Admitting: Cardiovascular Disease

## 2011-02-16 ENCOUNTER — Encounter (HOSPITAL_COMMUNITY)
Admission: RE | Admit: 2011-02-16 | Discharge: 2011-02-16 | Disposition: A | Discharge status: Home / Self Care | Payer: 59 | Source: Ambulatory Visit | Attending: Cardiovascular Disease | Admitting: Cardiovascular Disease

## 2011-02-18 ENCOUNTER — Encounter (HOSPITAL_COMMUNITY)
Admission: RE | Admit: 2011-02-18 | Discharge: 2011-02-18 | Disposition: A | Discharge status: Home / Self Care | Payer: 59 | Source: Ambulatory Visit | Attending: Cardiovascular Disease | Admitting: Cardiovascular Disease

## 2011-02-21 ENCOUNTER — Encounter (HOSPITAL_COMMUNITY)
Admission: RE | Admit: 2011-02-21 | Discharge: 2011-02-21 | Disposition: A | Discharge status: Home / Self Care | Payer: 59 | Source: Ambulatory Visit | Attending: Cardiovascular Disease | Admitting: Cardiovascular Disease

## 2011-02-23 ENCOUNTER — Encounter (HOSPITAL_COMMUNITY)
Admission: RE | Admit: 2011-02-23 | Discharge: 2011-02-23 | Disposition: A | Discharge status: Home / Self Care | Payer: 59 | Source: Ambulatory Visit | Attending: Cardiovascular Disease | Admitting: Cardiovascular Disease

## 2011-02-25 ENCOUNTER — Encounter (HOSPITAL_COMMUNITY)
Admission: RE | Admit: 2011-02-25 | Discharge: 2011-02-25 | Disposition: A | Discharge status: Home / Self Care | Payer: 59 | Source: Ambulatory Visit | Attending: Cardiovascular Disease | Admitting: Cardiovascular Disease

## 2011-02-28 ENCOUNTER — Encounter (HOSPITAL_COMMUNITY)
Admission: RE | Admit: 2011-02-28 | Discharge: 2011-02-28 | Disposition: A | Discharge status: Home / Self Care | Payer: 59 | Source: Ambulatory Visit | Attending: Cardiovascular Disease | Admitting: Cardiovascular Disease

## 2011-03-02 ENCOUNTER — Encounter (HOSPITAL_COMMUNITY): Payer: 59

## 2011-03-04 ENCOUNTER — Encounter (HOSPITAL_COMMUNITY): Payer: 59

## 2011-03-04 NOTE — Progress Notes (Signed)
Cardiac Rehabilitation Program Progress Report   Orientation:  11/18/2010 Graduate Date:  02/28/2011 Discharge Date:   # of sessions completed: 36  Cardiologist: Arrie Senate MD:  Myrene Galas Time:  0815  A.  Exercise Program:  Improved functional capacity  21.05 %, Improved  muscular strength  8.0 %, Improved  flexibility 7.14 % and Discharged to home exercise program.  Anticipated compliance:  good  B.  Mental Health:  Good mental attitude and Quality of Life (QOL)  changes:  Overall  -1.39 %, Health/Functioning 2.08 %, Socioeconomics -6.70 %, Psych/Spiritual -3.31 %, Family 0 %    C.  Education/Instruction/Skills  Accurately checks own pulse, Knows THR for exercise, Uses Perceived Exertion Scale and Attended 12 education classes    D.  Nutrition/Weight Control/Body Composition:  % Body Fat  27.2 and Patient has lost 1.7 kg BMI 26.3  *This section completed by Mickle Plumb, Andres Shad, RD, LDN, CDE  E.  Blood Lipids    Lab Results  Component Value Date   CHOL 129 09/24/2010     Lab Results  Component Value Date   TRIG 69.0 09/24/2010     Lab Results  Component Value Date   HDL 36.80* 09/24/2010     Lab Results  Component Value Date   CHOLHDL 4 09/24/2010     No results found for this basename: LDLDIRECT      F.  Lifestyle Changes:    G.  Symptoms noted with exercise:  Exertional hypertension  Report Completed By:  Dayton Martes   Comments:  Electronically signed by Harriett Sine MS on Friday March 04 2011 at 0813 EST

## 2011-03-07 ENCOUNTER — Encounter (HOSPITAL_COMMUNITY): Payer: 59

## 2011-03-09 ENCOUNTER — Encounter (HOSPITAL_COMMUNITY): Payer: 59

## 2011-03-09 NOTE — Progress Notes (Addendum)
Addendum to Nutrition Section of Cardiac Rehab Program Progress Report Pt now following a step 2 Therapeutic Lifestyle Changes diet.  Pt wt down only 1.7 kg yet pt % body fat decreased 7.17%. Lipid Panel     Component Value Date/Time   CHOL 129 09/24/2010 0829   TRIG 69.0 09/24/2010 0829   HDL 36.80* 09/24/2010 0829   CHOLHDL 4 09/24/2010 0829   VLDL 13.8 09/24/2010 0829   LDLCALC 78 09/24/2010 0829     Addendum to Cardiac Rehab Progress Report Pt successfully completed cardiac rehab program attending 36/36 sessions.  Pt had great attendance to exercise and education classes.  VSS, telemetry-NSR.  Pt made positive lifestyle modifications and should be commended for his efforts.  Pt plans to exercise on his own.  Pt did not have hospital admission during cardiac rehab period.

## 2011-03-11 ENCOUNTER — Encounter (HOSPITAL_COMMUNITY): Payer: 59

## 2011-03-14 ENCOUNTER — Encounter (HOSPITAL_COMMUNITY): Payer: 59

## 2011-03-16 ENCOUNTER — Encounter (HOSPITAL_COMMUNITY): Payer: 59

## 2011-03-18 ENCOUNTER — Encounter (HOSPITAL_COMMUNITY): Payer: 59

## 2011-03-21 ENCOUNTER — Encounter (HOSPITAL_COMMUNITY): Payer: 59

## 2011-03-23 ENCOUNTER — Encounter (HOSPITAL_COMMUNITY): Payer: 59

## 2011-03-25 ENCOUNTER — Encounter (HOSPITAL_COMMUNITY): Payer: 59

## 2011-03-29 ENCOUNTER — Other Ambulatory Visit: Payer: Self-pay | Admitting: Internal Medicine

## 2011-04-04 ENCOUNTER — Other Ambulatory Visit (INDEPENDENT_AMBULATORY_CARE_PROVIDER_SITE_OTHER): Payer: 59

## 2011-04-04 ENCOUNTER — Ambulatory Visit (INDEPENDENT_AMBULATORY_CARE_PROVIDER_SITE_OTHER): Payer: 59 | Admitting: Internal Medicine

## 2011-04-04 ENCOUNTER — Encounter: Payer: Self-pay | Admitting: Internal Medicine

## 2011-04-04 VITALS — BP 116/76 | HR 66 | Temp 98.1°F | Ht 69.0 in | Wt 182.6 lb

## 2011-04-04 DIAGNOSIS — M25559 Pain in unspecified hip: Secondary | ICD-10-CM

## 2011-04-04 DIAGNOSIS — E7800 Pure hypercholesterolemia, unspecified: Secondary | ICD-10-CM

## 2011-04-04 DIAGNOSIS — I10 Essential (primary) hypertension: Secondary | ICD-10-CM

## 2011-04-04 DIAGNOSIS — I259 Chronic ischemic heart disease, unspecified: Secondary | ICD-10-CM

## 2011-04-04 DIAGNOSIS — M25552 Pain in left hip: Secondary | ICD-10-CM

## 2011-04-04 LAB — LIPID PANEL
Cholesterol: 105 mg/dL (ref 0–200)
HDL: 33.6 mg/dL — ABNORMAL LOW (ref >39.00)
LDL Cholesterol: 63 mg/dL (ref 0–99)
Total CHOL/HDL Ratio: 3
Triglycerides: 40 mg/dL (ref 0.0–149.0)
VLDL: 8 mg/dL (ref 0.0–40.0)

## 2011-04-04 LAB — BASIC METABOLIC PANEL
BUN: 22 mg/dL (ref 6–23)
CO2: 30 mEq/L (ref 19–32)
Calcium: 9.4 mg/dL (ref 8.4–10.5)
Chloride: 108 mEq/L (ref 96–112)
Creatinine, Ser: 0.8 mg/dL (ref 0.4–1.5)
GFR: 130.18 mL/min (ref >60.00)
Glucose, Bld: 108 mg/dL — ABNORMAL HIGH (ref 70–99)
Potassium: 4.3 mEq/L (ref 3.5–5.1)
Sodium: 143 mEq/L (ref 135–145)

## 2011-04-04 LAB — HEPATIC FUNCTION PANEL
ALT: 27 U/L (ref 0–53)
AST: 27 U/L (ref 0–37)
Albumin: 4.2 g/dL (ref 3.5–5.2)
Alkaline Phosphatase: 43 U/L (ref 39–117)
Bilirubin, Direct: 0.1 mg/dL (ref 0.0–0.3)
Total Bilirubin: 0.7 mg/dL (ref 0.3–1.2)
Total Protein: 7.1 g/dL (ref 6.0–8.3)

## 2011-04-04 NOTE — Progress Notes (Signed)
Patient ID: [name] [name], male   DOB: 1955/09/18, 56 y.o.   MRN: [id number] Subjective:     Patient ID: [name] [name], male   DOB: 1955-12-04, 56 y.o.   MRN: [id number]  HPI  54 yobm quit smoking in early 1980's with hypertension hyperlipidemia and atypical chest pain with a left heart catheter showing 60% obstruction of the right-sided branches in the 1999 and a negative stress study in May of 2007.   November 20, 2007 ov to refill on vytorin with last ldl 101, no change in rx   July 17, 2008 cpx 2.8 miles power walk q am without difficulty    08/12/2010 ov/[name] [name]  Cc new onset cp comes and goes x 1 week so went to ER July 30th with neg w/u and much less cp since er.  Location = Left of sternum only, aching but   No relation to activity, lasts no more than a few seconds, not noticeable supine or sleeping.  Does ok with exercise with no pains. rec Cardiac eval    DATE OF ADMISSION: 10/18/2010  DATE OF DISCHARGE: 10/24/2010  DISCHARGE SUMMARY  PRIMARY ADMITTING DIAGNOSIS: Chest pain.  ADDITIONAL/DISCHARGE DIAGNOSES:  1. Coronary artery disease.  2. New-onset angina.  3. Hypertension.  4. Hyperlipidemia.  5. Remote history of tobacco abuse.  6. Postoperative acute blood loss anemia.  PROCEDURES PERFORMED:  1. Cardiac catheterization.  2. Coronary artery bypass grafting x4 (left internal mammary artery to  the LAD, saphenous vein graft to the second diagonal, saphenous  vein graft to the first obtuse marginal, saphenous vein graft to  the posterior descending).  3. Endoscopic vein harvest, right leg    01/03/2011 post hosp f/u ov/[name] [name] cc Patient states doing good. Denies sob, cough, chest pain,and chest tightness. rec Add diovan 160 mg one daily to reduce your longterm risk of complications from atheroslerosis.    04/04/2011 f/u ov/[name] [name] cc newL lat localized hip pain worse standing up, 3-4 weeks comes and goes, not tried nsaids, maybe worse toward end of day  Pt denies any  significant sore throat, dysphagia, itching, sneezing,  nasal congestion or excess/ purulent secretions,  fever, chills, sweats, unintended wt loss, clasically pleuritic cp, hempoptysis, orthopnea pnd or leg swelling.    Also denies any obvious fluctuation of symptoms with weather or environmental changes or other aggravating or alleviating factors.     Allergies  1) ! Codeine    Past Medical History:  HYPERTENSION, BENIGN ESSENTIAL, CONTROLLED (ICD-401.1)  OBESITY  - Target wt = 190 for BMI < 30  Colon polyps....................................................................................................[name]  - colonoscopy 4/04, letter sent 05/15/07 reviewed with pt July 17, 2008 and August 03, 2009 > referred back to GI  CHEST PAIN (ICD-786.50)  - s/p cabg 10/19/2010 HYPERLIPIDEMIA  - target LDL less than 70 based on documented ischemic heart disease  HEALTH MAINTENANCE..........................................................................................[name]  - TD April 2009  - CPX  04/04/11  Family History:  hypertension in mother and father no premature heart disease in any direct relatives nor stroke diabetes or cancer, except that his mother has developed leukemia  1 brother healthy   Social History:  quit smoking about 1983 denies alcohol use. 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  wt 201 November 20, 2007 > 205 April 18, 2008 > 201 July 17, 2008 >  184 01/03/2011 > 182 04/04/2011   HEENT: nl dentition, and orophanx. Severe tubinate edema R > L with eythema, no def purulent secretions or polyps  Neck without JVD/Nodes/TM. carotid upstrokes brisk without bruits  Lungs clear to A and P bilaterally without cough on insp or exp maneuvers  RRR no s3 or murmur or increase in P2. no edema  Abd soft and benign with nl excursion in the supine position.    Ext warm without calf tenderness, cyanosis clubbing  PT >> DP bilaterally  GU testes down, no nod, IH Rectal very mild bph, no nodules, stool g neg MS full rom all major joints including L hip and neg slr Neuro: nl sensorium, recall, no motor deficits  Assessment:         Plan:

## 2011-04-04 NOTE — Patient Instructions (Addendum)
Please remember to go to the lab   department downstairs for your tests - we will call you with the results when they are available.  Try advil up to 3 with meals to see if helps hip pain and if not better within a week call for referral to an orthopedic surgeon  Please schedule a follow up visit in 3 months but call sooner if needed

## 2011-04-05 NOTE — Progress Notes (Signed)
Quick Note:  Spoke with pt and notified of results per Dr. [name]. Pt verbalized understanding and denied any questions.  ______ 

## 2011-04-06 DIAGNOSIS — M25552 Pain in left hip: Secondary | ICD-10-CM | POA: Insufficient documentation

## 2011-04-06 NOTE — Assessment & Plan Note (Signed)
S/p cab 10/19/2010  >  Has done well.

## 2011-04-06 NOTE — Assessment & Plan Note (Signed)
Adequate control on present rx, reviewed  

## 2011-04-06 NOTE — Assessment & Plan Note (Signed)
Target LDL < 70 since has known CAD  LDL down to 63 on rx with zocor 40 mg daily > no change needed

## 2011-04-06 NOTE — Assessment & Plan Note (Signed)
Try nsaids first then refer to ortho if not improving w/in a week or two.  Advised to reduce works out that aggravate it for now

## 2011-07-04 ENCOUNTER — Ambulatory Visit (INDEPENDENT_AMBULATORY_CARE_PROVIDER_SITE_OTHER): Payer: BC Managed Care – PPO | Admitting: Internal Medicine

## 2011-07-04 ENCOUNTER — Encounter: Payer: Self-pay | Admitting: Internal Medicine

## 2011-07-04 VITALS — BP 122/70 | HR 67 | Temp 98.2°F | Ht 68.0 in | Wt 185.4 lb

## 2011-07-04 DIAGNOSIS — M25559 Pain in unspecified hip: Secondary | ICD-10-CM

## 2011-07-04 DIAGNOSIS — M25552 Pain in left hip: Secondary | ICD-10-CM

## 2011-07-04 DIAGNOSIS — E7800 Pure hypercholesterolemia, unspecified: Secondary | ICD-10-CM

## 2011-07-04 DIAGNOSIS — I10 Essential (primary) hypertension: Secondary | ICD-10-CM

## 2011-07-04 MED ORDER — SIMVASTATIN 40 MG PO TABS: 40.0000 mg | ORAL_TABLET | Freq: Every day | ORAL | Status: DC

## 2011-07-04 NOTE — Assessment & Plan Note (Signed)
Adequate control on present rx, reviewed: no changes needed

## 2011-07-04 NOTE — Patient Instructions (Addendum)
Diovan is available is generic but if you can't find it we can substitute cozaar (Losartan) 100 mg daily in it's place  Please see patient coordinator before you leave today  to schedule orthopedic eval for hip pain  Please schedule a follow up visit in 6 months but call sooner if needed

## 2011-07-04 NOTE — Assessment & Plan Note (Signed)
No response to nsaids > refer to ortho

## 2011-07-04 NOTE — Assessment & Plan Note (Signed)
-   Target LDL < 70 since has known CAD  Lab Results  Component Value Date   LDLCALC 63 04/04/2011      Adequate control on present rx, reviewed no changes needed

## 2011-07-04 NOTE — Progress Notes (Signed)
Patient ID: [name] [name], male   DOB: Jan 13, 1955, 56 y.o.   MRN: [id number] Subjective:     Patient ID: [name] [name], male   DOB: [birth year], 56 y.o.   MRN: [id number]  HPI  54 yobm quit smoking in early 1980's with hypertension hyperlipidemia and atypical chest pain with a left heart catheter showing 60% obstruction of the right-sided branches in the 1999 and a negative stress study in May of 2007.   November 20, 2007 ov to refill on vytorin with last ldl 101, no change in rx   July 17, 2008 cpx 2.8 miles power walk q am without difficulty    08/12/2010 ov/Karlen Barbar  Cc new onset cp comes and goes x 1 week so went to ER July 30th with neg w/u and much less cp since er.  Location = Left of sternum only, aching but   No relation to activity, lasts no more than a few seconds, not noticeable supine or sleeping.  Does ok with exercise with no pains. rec Cardiac eval    DATE OF ADMISSION: 10/18/2010  DATE OF DISCHARGE: 10/24/2010  DISCHARGE SUMMARY  PRIMARY ADMITTING DIAGNOSIS: Chest pain.  ADDITIONAL/DISCHARGE DIAGNOSES:  1. Coronary artery disease.  2. New-onset angina.  3. Hypertension.  4. Hyperlipidemia.  5. Remote history of tobacco abuse.  6. Postoperative acute blood loss anemia.  PROCEDURES PERFORMED:  1. Cardiac catheterization.  2. Coronary artery bypass grafting x4 (left internal mammary artery to  the LAD, saphenous vein graft to the second diagonal, saphenous  vein graft to the first obtuse marginal, saphenous vein graft to  the posterior descending).  3. Endoscopic vein harvest, right leg    01/03/2011 post hosp f/u ov/Nyko Gell cc Patient states doing good. Denies sob, cough, chest pain,and chest tightness. rec Add diovan 160 mg one daily to reduce your longterm risk of complications from atheroslerosis.    04/04/2011 f/u ov/Graeden Bitner cc newL lat localized hip pain worse standing up, 3-4 weeks comes and goes, not tried nsaids, maybe worse toward end of day rec Try advil up  to 3 with meals to see if helps hip pain and if not better within a week call for referral to an orthopedic surgeon Please schedule a follow up visit in 3 months but call sooner if needed     07/04/2011 f/u ov/Kirsten Mckone cc still hurting in L hip. No better with advil, no radicular features. No other joint complaints, no real variability with weather. No cp tia or claudication symptoms  Pt denies any significant sore throat, dysphagia, itching, sneezing,  nasal congestion or excess/ purulent secretions,  fever, chills, sweats, unintended wt loss, clasically pleuritic cp, hempoptysis, orthopnea pnd or leg swelling.    Also denies any obvious fluctuation of symptoms with weather or environmental changes or other aggravating or alleviating factors.     Allergies  1) ! Codeine    Past Medical History:  HYPERTENSION, BENIGN ESSENTIAL, CONTROLLED (ICD-401.1)  OBESITY  - Target wt = 190 for BMI < 30  Colon polyps....................................................................................................[name]  - colonoscopy 4/04, letter sent 05/15/07 reviewed with pt July 17, 2008 and August 03, 2009 > referred back to GI  CHEST PAIN (ICD-786.50)  - s/p cabg 10/19/2010 HYPERLIPIDEMIA  - target LDL less than 70 based on documented ischemic heart disease  HEALTH MAINTENANCE..........................................................................................[name]  - TD April 2009  - CPX  04/04/11  Family History:  hypertension in mother and father no premature heart disease in any direct relatives nor stroke diabetes or cancer, except that  his mother has developed leukemia  1 brother healthy   Social History:  quit smoking about 1983 denies alcohol use. 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  wt 201 November 20, 2007 > 205 April 18, 2008 > 184 01/03/2011 > 182  04/04/2011 > 07/04/2011  185  HEENT: nl dentition, and orophanx. Severe tubinate edema R > L with eythema, no def purulent secretions or polyps  Neck without JVD/Nodes/TM. carotid upstrokes brisk without bruits  Lungs clear to A and P bilaterally without cough on insp or exp maneuvers  RRR no s3 or murmur or increase in P2. no edema  Abd soft and benign with nl excursion in the supine position.  Ext warm without calf tenderness, cyanosis clubbing  PT >> DP bilaterally     Assessment:         Plan:

## 2011-07-13 ENCOUNTER — Ambulatory Visit (INDEPENDENT_AMBULATORY_CARE_PROVIDER_SITE_OTHER): Payer: BC Managed Care – PPO | Admitting: Cardiovascular Disease

## 2011-07-13 ENCOUNTER — Encounter: Payer: Self-pay | Admitting: Cardiovascular Disease

## 2011-07-13 VITALS — BP 137/89 | HR 56 | Wt 187.0 lb

## 2011-07-13 DIAGNOSIS — Z951 Presence of aortocoronary bypass graft: Secondary | ICD-10-CM

## 2011-07-13 DIAGNOSIS — I10 Essential (primary) hypertension: Secondary | ICD-10-CM

## 2011-07-13 DIAGNOSIS — E7800 Pure hypercholesterolemia, unspecified: Secondary | ICD-10-CM

## 2011-07-13 NOTE — Assessment & Plan Note (Signed)
Stable with no angina and good activity level.  Continue medical Rx  

## 2011-07-13 NOTE — Assessment & Plan Note (Addendum)
Well controlled.  Continue current medications and low sodium Dash type diet.   Back on Diovan

## 2011-07-13 NOTE — Patient Instructions (Signed)
Your physician wants you to follow-up in:  6 MONTHS WITH DR NISHAN  You will receive a reminder letter in the mail two months in advance. If you don't receive a letter, please call our office to schedule the follow-up appointment. Your physician recommends that you continue on your current medications as directed. Please refer to the Current Medication list given to you today. 

## 2011-07-13 NOTE — Assessment & Plan Note (Signed)
Cholesterol is at goal.  Continue current dose of statin and diet Rx.  No myalgias or side effects.  F/U  LFT's in 6 months. Lab Results  Component Value Date   LDLCALC 63 04/04/2011

## 2011-07-13 NOTE — Progress Notes (Signed)
Patient ID: [name] [name], male   DOB: 20-Oct-1955, 56 y.o.   MRN: [id number] S/P CABG 10/19/10 [facility]  SURGICAL PROCEDURE: Coronary artery bypass grafting x4 with the left  internal mammary to the left anterior descending coronary artery,  reverse saphenous vein graft to the second diagonal coronary artery,  reverse saphenous vein graft to the first obtuse marginal, reverse  saphenous vein graft to the posterior descending coronary artery with  right leg endovein harvesting.  Back riding motorcycle.  Mild chest paresthesias.  Going to gym   ROS: Denies fever, malais, weight loss, blurry vision, decreased visual acuity, cough, sputum, SOB, hemoptysis, pleuritic pain, palpitaitons, heartburn, abdominal pain, melena, lower extremity edema, claudication, or rash.  All other systems reviewed and negative  General: Affect appropriate Healthy:  appears stated age HEENT: normal Neck supple with no adenopathy JVP normal no bruits no thyromegaly Lungs clear with no wheezing and good diaphragmatic motion Heart:  S1/S2 no murmur, no rub, gallop or click PMI normal Abdomen: benighn, BS positve, no tenderness, no AAA no bruit.  No HSM or HJR Distal pulses intact with no bruits No edema Neuro non-focal Skin warm and dry No muscular weakness   Current Outpatient Prescriptions  Medication Sig Dispense Refill  . acetaminophen (TYLENOL) 500 MG tablet Take 1,000 mg by mouth every 6 (six) hours as needed. pain      . aspirin 325 MG EC tablet Take 325 mg by mouth daily.        . clotrimazole-betamethasone (LOTRISONE) cream Apply topically 2 (two) times daily as needed.        Marland Kitchen guaiFENesin-dextromethorphan (ROBITUSSIN DM) 100-10 MG/5ML syrup as needed. Cough or congestion      . ibuprofen (ADVIL,MOTRIN) 200 MG tablet Take 200 mg by mouth every 6 (six) hours as needed. pain      . metoprolol tartrate (LOPRESSOR) 25 MG tablet TAKE 1 TABLET (25 MG TOTAL) BY MOUTH 2 (TWO) TIMES DAILY.  60 tablet  11    . nitroGLYCERIN (NITROSTAT) 0.4 MG SL tablet Place 1 tablet (0.4 mg total) under the tongue every 5 (five) minutes as needed for chest pain.  25 tablet  12  . oxyCODONE (OXY IR/ROXICODONE) 5 MG immediate release tablet Take 1 tablet (5 mg total) by mouth every 4 (four) hours as needed.  30 tablet  0  . Polyethylene Glycol 3350 (MIRALAX PO) Take 17 g/day by mouth daily. constipation      . simvastatin (ZOCOR) 40 MG tablet Take 1 tablet (40 mg total) by mouth at bedtime.  360 tablet  1  . sodium chloride (OCEAN) 0.65 % nasal spray Place 2 sprays into the nose every 6 (six) hours as needed. Nasal congestion      . traMADol (ULTRAM) 50 MG tablet Take 50 mg by mouth every 6 (six) hours as needed. Maximum dose= 8 tablets per day For pain      . valsartan (DIOVAN) 160 MG tablet Take 1 tablet (160 mg total) by mouth daily.  30 tablet  11    Allergies  Codeine  Electrocardiogram:  Assessment and Plan

## 2011-08-30 ENCOUNTER — Telehealth: Payer: Self-pay | Admitting: Internal Medicine

## 2011-08-30 MED ORDER — LOSARTAN POTASSIUM 100 MG PO TABS: 100.0000 mg | ORAL_TABLET | Freq: Every day | ORAL | Status: DC

## 2011-08-30 NOTE — Telephone Encounter (Signed)
Dr Sherene Sires, please advise alternative to for diovan 160 mg due to cost increase. Thanks!

## 2011-08-30 NOTE — Telephone Encounter (Signed)
Closed in error.

## 2011-08-30 NOTE — Telephone Encounter (Signed)
Should be able to get this is generic now, if not losartan 100 mg daily is a good substitute

## 2011-08-30 NOTE — Addendum Note (Signed)
Addended by: Tommie Sams on: 08/30/2011 04:10 PM   Modules accepted: Orders

## 2011-08-30 NOTE — Telephone Encounter (Signed)
diovan is only generic w/ the HCT. I spoke with pt and is aware his BP medication will be changed to losartan 100 mg QD. Also he stated he has had some PND and wanted to know what he could take. I advised him to try chlorpheniramine OTC. He voiced his understanding and needed nothing further  rx for losartan has been sent

## 2011-08-31 ENCOUNTER — Other Ambulatory Visit: Payer: Self-pay | Admitting: Internal Medicine

## 2011-09-19 ENCOUNTER — Telehealth: Payer: Self-pay | Admitting: Internal Medicine

## 2011-09-19 ENCOUNTER — Encounter: Payer: Self-pay | Admitting: Adult Health

## 2011-09-19 ENCOUNTER — Ambulatory Visit (INDEPENDENT_AMBULATORY_CARE_PROVIDER_SITE_OTHER): Payer: BC Managed Care – PPO | Admitting: Adult Health

## 2011-09-19 VITALS — BP 140/92 | HR 80 | Temp 97.6°F | Ht 69.0 in | Wt 189.2 lb

## 2011-09-19 DIAGNOSIS — J309 Allergic rhinitis, unspecified: Secondary | ICD-10-CM

## 2011-09-19 MED ORDER — AZITHROMYCIN 250 MG PO TABS: ORAL_TABLET | ORAL | Status: AC

## 2011-09-19 NOTE — Telephone Encounter (Signed)
I spoke with pt and he stated he is still having OND, slight cough, and stuffy nose. He stated he is ready to get rid of this. I offered pt to come in and see TP this afternoon since MW was off. He was fine with that. He is coming in at 3:30 to see TP today.

## 2011-09-19 NOTE — Patient Instructions (Addendum)
Delsym 2 tsp Twice daily  As needed  Cough.  Allegra 180mg  daily for drainage As needed   Saline nasal rinses As needed   Tylenol As needed   Fluids and rest  Nasonex 2 puffs Twice daily  Until sample is gone  Zpack to have on hold if symptoms worsen with discolored mucus.  Please contact office for sooner follow up if symptoms do not improve or worsen or seek emergency care

## 2011-09-19 NOTE — Progress Notes (Signed)
Patient ID: [name] [name], male   DOB: 1955-09-12, 56 y.o.   MRN: [id number] Subjective:     Patient ID: [name] [name], male   DOB: Oct 18, 1955, 56 y.o.   MRN: [id number]  HPI  54 yobm quit smoking in early 1980's with hypertension hyperlipidemia and atypical chest pain with a left heart catheter showing 60% obstruction of the right-sided branches in the 1999 and a negative stress study in May of 2007.   November 20, 2007 ov to refill on vytorin with last ldl 101, no change in rx   July 17, 2008 cpx 2.8 miles power walk q am without difficulty    08/12/2010 ov/[name]  Cc new onset cp comes and goes x 1 week so went to ER July 30th with neg w/u and much less cp since er.  Location = Left of sternum only, aching but   No relation to activity, lasts no more than a few seconds, not noticeable supine or sleeping.  Does ok with exercise with no pains. rec Cardiac eval    DATE OF ADMISSION: 10/18/2010  DATE OF DISCHARGE: 10/24/2010  DISCHARGE SUMMARY  PRIMARY ADMITTING DIAGNOSIS: Chest pain.  ADDITIONAL/DISCHARGE DIAGNOSES:  1. Coronary artery disease.  2. New-onset angina.  3. Hypertension.  4. Hyperlipidemia.  5. Remote history of tobacco abuse.  6. Postoperative acute blood loss anemia.  PROCEDURES PERFORMED:  1. Cardiac catheterization.  2. Coronary artery bypass grafting x4 (left internal mammary artery to  the LAD, saphenous vein graft to the second diagonal, saphenous  vein graft to the first obtuse marginal, saphenous vein graft to  the posterior descending).  3. Endoscopic vein harvest, right leg    01/03/2011 post hosp f/u ov/[name] cc Patient states doing good. Denies sob, cough, chest pain,and chest tightness. rec Add diovan 160 mg one daily to reduce your longterm risk of complications from atheroslerosis.    04/04/2011 f/u ov/[name] cc newL lat localized hip pain worse standing up, 3-4 weeks comes and goes, not tried nsaids, maybe worse toward end of day rec Try advil up  to 3 with meals to see if helps hip pain and if not better within a week call for referral to an orthopedic surgeon Please schedule a follow up visit in 3 months but call sooner if needed     07/04/2011 f/u ov/[name] cc still hurting in L hip. No better with advil, no radicular features. No other joint complaints, no real variability with weather. No cp tia or claudication symptoms   09/19/2011 Acute OV  Complains of dry cough, PND, chills/sweats x 3 weeks .  Took otc meds with limited help.  Over last 3 days symptoms worsened with cough and aches.  No fever or discolored mucus.  Does not feel good.  Has some post nasal drip . Using chlor tabsl with some help.     Allergies  1) ! Codeine    Past Medical History:  HYPERTENSION, BENIGN ESSENTIAL, CONTROLLED (ICD-401.1)  OBESITY  - Target wt = 190 for BMI < 30  Colon polyps....................................................................................................[name]  - colonoscopy 4/04, letter sent 05/15/07 reviewed with pt July 17, 2008 and August 03, 2009 > referred back to GI  CHEST PAIN (ICD-786.50)  - s/p cabg 10/19/2010 HYPERLIPIDEMIA  - target LDL less than 70 based on documented ischemic heart disease  HEALTH MAINTENANCE..........................................................................................[name]  - TD April 2009  - CPX  04/04/11  Family History:  hypertension in mother and father no premature heart disease in any direct relatives nor stroke diabetes  or cancer, except that his mother has developed leukemia  1 brother healthy   Social History:  quit smoking about 1983 denies alcohol use. 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  HEENT:   No headaches,  Difficulty swallowing,  Tooth/dental problems, or  Sore throat,                No sneezing, itching, ear ache,  +nasal congestion,  post nasal drip,   CV:  No chest pain,  Orthopnea, PND, swelling in lower extremities, anasarca, dizziness, palpitations, syncope.   GI  No heartburn, indigestion, abdominal pain, nausea, vomiting, diarrhea, change in bowel habits, loss of appetite, bloody stools.   Resp:    No coughing up of blood.   Marland Kitchen  No chest wall deformity  Skin: no rash or lesions.  GU: no dysuria, change in color of urine, no urgency or frequency.  No flank pain, no hematuria   MS:  No joint pain or swelling.  No decreased range of motion.  No back pain.  Psych:  No change in mood or affect. No depression or anxiety.  No memory loss.            Objective:   Physical Exam  Ambulatory healthy appearing black male in no acute distress.  wt 201 November 20, 2007 > 205 April 18, 2008 > 184 01/03/2011 > 182 04/04/2011 > 07/04/2011  185 >189 09/19/2011   HEENT: nl dentition, and orophanx. Pale mucosa, non tender sinus   Neck without JVD/Nodes/TM. carotid upstrokes brisk without bruits  Lungs clear to A and P bilaterally  RRR no s3 or murmur or increase in P2. no edema  Abd soft and benign with nl excursion in the supine position.  Ext warm without calf tenderness, cyanosis clubbing       Assessment:         Plan:

## 2011-09-19 NOTE — Assessment & Plan Note (Signed)
Flare with URI   Plan  Delsym 2 tsp Twice daily  As needed  Cough.  Allegra 180mg  daily for drainage As needed   Saline nasal rinses As needed   Tylenol As needed   Fluids and rest  Nasonex 2 puffs Twice daily  Until sample is gone  Zpack to have on hold if symptoms worsen with discolored mucus.  Please contact office for sooner follow up if symptoms do not improve or worsen or seek emergency care

## 2011-11-16 ENCOUNTER — Telehealth: Payer: Self-pay | Admitting: Cardiovascular Disease

## 2011-11-16 MED ORDER — NITROGLYCERIN 0.4 MG SL SUBL: 0.4000 mg | SUBLINGUAL_TABLET | SUBLINGUAL | Status: DC | PRN

## 2011-11-16 NOTE — Telephone Encounter (Signed)
PT COMPLAINED OF AN EPISODE  OF  TWINGE FEELING IN CHEST   LASTING ONLY  SECONDS WHILE DRIVING NO OTHER SYMPTOMS   PT IS IS DUE FOR FOLLOW UP IN DEC APPT MADE WITH DR Eden Emms INFORMED PT TO CALL  IF  NOTES  MORE FREQUENT EPISODES   OR WORSENING  S/S  FRESH NTG SCRIPT SENT TO PHARMACY VIA EPIC .WILL FORWARD TO DR Eden Emms FOR REVIEW .Zack Seal

## 2011-11-16 NOTE — Telephone Encounter (Signed)
New Problem:    Patient called in because today he experienced some momentary chest pain that moved form left to right across his chest.  Patient did not experience any other symptoms and it passed quickly.  Patient states that he feels ok now but is concerned what it was.  Please call back.

## 2011-12-12 ENCOUNTER — Encounter: Payer: Self-pay | Admitting: Cardiovascular Disease

## 2011-12-12 ENCOUNTER — Ambulatory Visit (INDEPENDENT_AMBULATORY_CARE_PROVIDER_SITE_OTHER): Payer: BC Managed Care – PPO | Admitting: Cardiovascular Disease

## 2011-12-12 VITALS — BP 144/88 | HR 60 | Ht 69.0 in | Wt 188.0 lb

## 2011-12-12 DIAGNOSIS — Z951 Presence of aortocoronary bypass graft: Secondary | ICD-10-CM

## 2011-12-12 DIAGNOSIS — I10 Essential (primary) hypertension: Secondary | ICD-10-CM

## 2011-12-12 DIAGNOSIS — E7800 Pure hypercholesterolemia, unspecified: Secondary | ICD-10-CM

## 2011-12-12 NOTE — Assessment & Plan Note (Signed)
Stable with no angina and good activity level.  Continue medical Rx Will take nitro for any prolonged pains  ASA 325 mf

## 2011-12-12 NOTE — Assessment & Plan Note (Signed)
Well controlled.  Continue current medications and low sodium Dash type diet.    

## 2011-12-12 NOTE — Assessment & Plan Note (Signed)
Cholesterol is at goal.  Continue current dose of statin and diet Rx.  No myalgias or side effects.  F/U  LFT's in 6 months. Lab Results  Component Value Date   LDLCALC 63 04/04/2011             

## 2011-12-12 NOTE — Patient Instructions (Signed)
Your physician wants you to follow-up in:   6 months with DR NISHAN You will receive a reminder letter in the mail two months in advance. If you don't receive a letter, please call our office to schedule the follow-up appointment. Your physician recommends that you continue on your current medications as directed. Please refer to the Current Medication list given to you today. 

## 2011-12-12 NOTE — Progress Notes (Signed)
Patient ID: [name] [name], male   DOB: Jan 26, 1955, 56 y.o.   MRN: [id number] S/P CABG 10/19/10 [name]  F/U CAD  SURGICAL PROCEDURE: Coronary artery bypass grafting x4 with the left  internal mammary to the left anterior descending coronary artery,  reverse saphenous vein graft to the second diagonal coronary artery,  reverse saphenous vein graft to the first obtuse marginal, reverse  saphenous vein graft to the posterior descending coronary artery with  right leg endovein harvesting.   Back riding motorcycle. Mild chest paresthesias. Going to gym Some left hip pain.  Two episodes of non exertional chest pain Did not take nitro  ROS: Denies fever, malais, weight loss, blurry vision, decreased visual acuity, cough, sputum, SOB, hemoptysis, pleuritic pain, palpitaitons, heartburn, abdominal pain, melena, lower extremity edema, claudication, or rash.  All other systems reviewed and negative  General: Affect appropriate Healthy:  appears stated age HEENT: normal Neck supple with no adenopathy JVP normal no bruits no thyromegaly Lungs clear with no wheezing and good diaphragmatic motion Heart:  S1/S2 1/6 systolic  murmur, no rub, gallop or click PMI normal Abdomen: benighn, BS positve, no tenderness, no AAA no bruit.  No HSM or HJR Distal pulses intact with no bruits No edema Neuro non-focal Skin warm and dry No muscular weakness   Current Outpatient Prescriptions  Medication Sig Dispense Refill  . acetaminophen (TYLENOL) 500 MG tablet Take 1,000 mg by mouth every 6 (six) hours as needed. pain      . aspirin 325 MG EC tablet Take 325 mg by mouth daily.        . chlorpheniramine (CHLOR-TRIMETON) 4 MG tablet Take 4 mg by mouth 2 (two) times daily as needed.      . clotrimazole-betamethasone (LOTRISONE) cream Apply topically 2 (two) times daily as needed.        Marland Kitchen guaiFENesin-dextromethorphan (ROBITUSSIN DM) 100-10 MG/5ML syrup as needed. Cough or congestion      . ibuprofen  (ADVIL,MOTRIN) 200 MG tablet Take 200 mg by mouth every 6 (six) hours as needed. pain      . losartan (COZAAR) 100 MG tablet Take 1 tablet (100 mg total) by mouth daily.  30 tablet  6  . metoprolol tartrate (LOPRESSOR) 25 MG tablet TAKE 1 TABLET (25 MG TOTAL) BY MOUTH 2 (TWO) TIMES DAILY.  60 tablet  11  . nitroGLYCERIN (NITROSTAT) 0.4 MG SL tablet Place 1 tablet (0.4 mg total) under the tongue every 5 (five) minutes as needed for chest pain.  25 tablet  4  . Polyethylene Glycol 3350 (MIRALAX PO) Take 17 g/day by mouth daily. constipation      . simvastatin (ZOCOR) 40 MG tablet TAKE 1 TABLET AT BEDTIME  30 tablet  11  . sodium chloride (OCEAN) 0.65 % nasal spray Place 2 sprays into the nose every 6 (six) hours as needed. Nasal congestion      . traMADol (ULTRAM) 50 MG tablet Take 50 mg by mouth every 6 (six) hours as needed. Maximum dose= 8 tablets per day For pain      . [DISCONTINUED] nitroGLYCERIN (NITROSTAT) 0.4 MG SL tablet Place 1 tablet (0.4 mg total) under the tongue every 5 (five) minutes as needed for chest pain.  25 tablet  12    Allergies  Codeine  Electrocardiogram: 02/11/11  SR rate 71  Nonspecific T wave changes in inferolateral leads  Assessment and Plan

## 2012-01-02 ENCOUNTER — Encounter: Payer: Self-pay | Admitting: Internal Medicine

## 2012-01-02 ENCOUNTER — Other Ambulatory Visit (INDEPENDENT_AMBULATORY_CARE_PROVIDER_SITE_OTHER): Payer: BC Managed Care – PPO

## 2012-01-02 ENCOUNTER — Ambulatory Visit (INDEPENDENT_AMBULATORY_CARE_PROVIDER_SITE_OTHER): Payer: BC Managed Care – PPO | Admitting: Internal Medicine

## 2012-01-02 VITALS — BP 122/80 | HR 60 | Temp 98.3°F | Ht 69.0 in | Wt 194.4 lb

## 2012-01-02 DIAGNOSIS — J309 Allergic rhinitis, unspecified: Secondary | ICD-10-CM

## 2012-01-02 DIAGNOSIS — E7800 Pure hypercholesterolemia, unspecified: Secondary | ICD-10-CM

## 2012-01-02 DIAGNOSIS — I10 Essential (primary) hypertension: Secondary | ICD-10-CM

## 2012-01-02 DIAGNOSIS — I259 Chronic ischemic heart disease, unspecified: Secondary | ICD-10-CM

## 2012-01-02 LAB — URINALYSIS
Bilirubin Urine: NEGATIVE
Hgb urine dipstick: NEGATIVE
Ketones, ur: NEGATIVE
Leukocytes, UA: NEGATIVE
Nitrite: NEGATIVE
Specific Gravity, Urine: >=1.03 (ref 1.000–1.030)
Total Protein, Urine: NEGATIVE
Urine Glucose: NEGATIVE
Urobilinogen, UA: 0.2 (ref 0.0–1.0)
pH: 6 (ref 5.0–8.0)

## 2012-01-02 LAB — BASIC METABOLIC PANEL
BUN: 21 mg/dL (ref 6–23)
CO2: 29 mEq/L (ref 19–32)
Calcium: 9.5 mg/dL (ref 8.4–10.5)
Chloride: 103 mEq/L (ref 96–112)
Creatinine, Ser: 0.9 mg/dL (ref 0.4–1.5)
GFR: 111.7 mL/min (ref >60.00)
Glucose, Bld: 100 mg/dL — ABNORMAL HIGH (ref 70–99)
Potassium: 4.7 mEq/L (ref 3.5–5.1)
Sodium: 139 mEq/L (ref 135–145)

## 2012-01-02 LAB — CBC WITH DIFFERENTIAL/PLATELET
Basophils Absolute: 0 10*3/uL (ref 0.0–0.1)
Basophils Relative: 0.5 % (ref 0.0–3.0)
Eosinophils Absolute: 0.1 10*3/uL (ref 0.0–0.7)
Eosinophils Relative: 1.2 % (ref 0.0–5.0)
HCT: 38.8 % — ABNORMAL LOW (ref 39.0–52.0)
Hemoglobin: 13.2 g/dL (ref 13.0–17.0)
Lymphocytes Relative: 30.3 % (ref 12.0–46.0)
Lymphs Abs: 1.7 10*3/uL (ref 0.7–4.0)
MCHC: 34 g/dL (ref 30.0–36.0)
MCV: 91.7 fl (ref 78.0–100.0)
Monocytes Absolute: 0.4 10*3/uL (ref 0.1–1.0)
Monocytes Relative: 7.8 % (ref 3.0–12.0)
Neutro Abs: 3.4 10*3/uL (ref 1.4–7.7)
Neutrophils Relative %: 60.2 % (ref 43.0–77.0)
Platelets: 214 10*3/uL (ref 150.0–400.0)
RBC: 4.24 Mil/uL (ref 4.22–5.81)
RDW: 13.2 % (ref 11.5–14.6)
WBC: 5.7 10*3/uL (ref 4.5–10.5)

## 2012-01-02 LAB — URINALYSIS, ROUTINE W REFLEX MICROSCOPIC
Bilirubin Urine: NEGATIVE
Hgb urine dipstick: NEGATIVE
Ketones, ur: NEGATIVE
Leukocytes, UA: NEGATIVE
Nitrite: NEGATIVE
Specific Gravity, Urine: >=1.03 (ref 1.000–1.030)
Total Protein, Urine: NEGATIVE
Urine Glucose: NEGATIVE
Urobilinogen, UA: 0.2 (ref 0.0–1.0)
pH: 6 (ref 5.0–8.0)

## 2012-01-02 LAB — LIPID PANEL
Cholesterol: 128 mg/dL (ref 0–200)
HDL: 37.5 mg/dL — ABNORMAL LOW (ref >39.00)
LDL Cholesterol: 75 mg/dL (ref 0–99)
Total CHOL/HDL Ratio: 3
Triglycerides: 76 mg/dL (ref 0.0–149.0)
VLDL: 15.2 mg/dL (ref 0.0–40.0)

## 2012-01-02 LAB — TSH: TSH: 1.22 u[IU]/mL (ref 0.35–5.50)

## 2012-01-02 LAB — HEPATIC FUNCTION PANEL
ALT: 26 U/L (ref 0–53)
AST: 24 U/L (ref 0–37)
Albumin: 4.3 g/dL (ref 3.5–5.2)
Alkaline Phosphatase: 44 U/L (ref 39–117)
Bilirubin, Direct: 0.1 mg/dL (ref 0.0–0.3)
Total Bilirubin: 0.7 mg/dL (ref 0.3–1.2)
Total Protein: 6.8 g/dL (ref 6.0–8.3)

## 2012-01-02 NOTE — Progress Notes (Signed)
Patient ID: [name] [name], male   DOB: Jul 12, 1955   MRN: [id number]    Brief patient profile:  56yobm quit smoking in early 1980's with hypertension hyperlipidemia and atypical chest pain with a left heart catheter showing 60% obstruction of the right-sided branches in the 1999 and a negative stress study in May of 2007> cabg required 10/2010    08/12/2010 ov/[name] [name]  Cc new onset cp comes and goes x 1 week so went to ER July 30th with neg w/u and much less cp since er.  Location = Left of sternum only, aching but   No relation to activity, lasts no more than a few seconds, not noticeable supine or sleeping.  Does ok with exercise with no pains. rec Cardiac eval    DATE OF ADMISSION: 10/18/2010  DATE OF DISCHARGE: 10/24/2010  DISCHARGE SUMMARY  PRIMARY ADMITTING DIAGNOSIS: Chest pain.  ADDITIONAL/DISCHARGE DIAGNOSES:  1. Coronary artery disease.  2. New-onset angina.  3. Hypertension.  4. Hyperlipidemia.  5. Remote history of tobacco abuse.  6. Postoperative acute blood loss anemia.  PROCEDURES PERFORMED:  1. Cardiac catheterization.  2. Coronary artery bypass grafting x4 (left internal mammary artery to  the LAD, saphenous vein graft to the second diagonal, saphenous  vein graft to the first obtuse marginal, saphenous vein graft to  the posterior descending).  3. Endoscopic vein harvest, right leg  04/04/2011 f/u ov/[name] [name] cc new L lat localized hip pain worse standing up, 3-4 weeks comes and goes, not tried nsaids, maybe worse toward end of day rec Try advil up to 3 with meals to see if helps hip pain and if not better within a week call for referral to an orthopedic surgeon> referred   01/02/2012 f/u ov/[name] [name] f/u hbp, hyperlipidemia complicated by CAD s/p cabg 10/19/10. No cp, tia or claudication symptoms  Sleeping ok without nocturnal  or early am exacerbation  of respiratory  c/o's or need for noct saba. Also denies any obvious fluctuation of symptoms with weather or  environmental changes or other aggravating or alleviating factors except as outlined above  ROS  The following are not active complaints unless bolded sore throat, dysphagia, dental problems, itching, sneezing,  nasal congestion or excess/ purulent secretions, ear ache,   fever, chills, sweats, unintended wt loss, pleuritic or exertional cp, hemoptysis,  orthopnea pnd or leg swelling, presyncope, palpitations, heartburn, abdominal pain, anorexia, nausea, vomiting, diarrhea  or change in bowel or urinary habits, change in stools or urine, dysuria,hematuria,  rash, arthralgias, visual complaints, headache, numbness weakness or ataxia or problems with walking or coordination,  change in mood/affect or memory.        Allergies  1) ! Codeine    Past Medical History:  HYPERTENSION, BENIGN ESSENTIAL, CONTROLLED (ICD-401.1)  OBESITY  - Target wt = 190 for BMI < 30  Colon polyps....................................................................................................[name]  - colonoscopy 4/04, letter sent 05/15/07 reviewed with pt July 17, 2008 and August 03, 2009 > referred back to GI  CHEST PAIN (ICD-786.50)  - s/p cabg 10/19/2010 HYPERLIPIDEMIA  - target LDL less than 70 based on documented ischemic heart disease  HEALTH MAINTENANCE..........................................................................................[name]  - TD April 2009  - CPX  04/04/11  Family History:  hypertension in mother and father no premature heart disease in any direct relatives nor stroke diabetes or cancer, except that his mother has developed leukemia  1 brother healthy   Social History:  quit smoking about 1983 denies alcohol use. 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  wt 201 November 20, 2007 > 205 April 18, 2008 > 184 01/03/2011 > 182 04/04/2011 > 07/04/2011   185 >189 09/19/2011 > 01/02/2012 194   Gen: No acute distress. Well nourished and well groomed.  Neurological: Alert and oriented to person, place, and time. Coordination normal.  Head: Normocephalic and atraumatic.  Eyes: Conjunctivae are normal. Pupils are equal, round, and reactive to light. No scleral icterus.  Neck: Normal range of motion. Neck supple. No tracheal deviation or thyromegaly present. No cervical lymphadenopathy.  Cardiovascular: Normal rate, regular rhythm, normal heart sounds and intact distal pulses. Exam reveals no gallop and no friction rub. No murmur heard.  Respiratory: Effort normal. No respiratory distress. No chest wall tenderness. Breath sounds normal. No wheezes, rales or rhonchi.  GI: Soft. Bowel sounds are normal. The abdomen is soft and nontender. There is no rebound and no guarding.  Musculoskeletal: Normal range of motion. Extremities are nontender.  Skin: Skin is warm and dry. No rash noted. No diaphoresis. No erythema. No pallor. No clubbing, cyanosis, or edema.  Psychiatric: Normal mood and affect. Behavior is normal. Judgment and thought content normal.            Assessment:         Plan:

## 2012-01-02 NOTE — Patient Instructions (Addendum)
Please remember to go to the lab   department downstairs for your tests - we will call you with the results when they are available.  Return after 04/10/2012  For full physical

## 2012-01-03 ENCOUNTER — Encounter: Payer: Self-pay | Admitting: Internal Medicine

## 2012-01-03 NOTE — Assessment & Plan Note (Signed)
-   Target LDL < 70 since has known CAD  Lab Results  Component Value Date   CHOL 128 01/02/2012   HDL 37.50* 01/02/2012   LDLCALC 75 01/02/2012   TRIG 76.0 01/02/2012   CHOLHDL 3 01/02/2012    Adequate control on present rx, reviewed

## 2012-01-03 NOTE — Progress Notes (Signed)
Quick Note:  Spoke with pt and notified of results per Dr. [name]. Pt verbalized understanding and denied any questions.  ______ 

## 2012-01-03 NOTE — Assessment & Plan Note (Signed)
-   IHD with 60% obstruction 1999 RCA negative stress Cardiolite May 2007  - CABG 10/19/2010  I had an extended discussion with the patient today lasting 15 to 20 minutes of a 25 minute visit on the following issues:   Key is controlling risk factors and getting enough regular aerobic ex to give early warning should one of the grafts start to close but doing great in all regards to date so not change in meds or plans for conservative cardiology f/u

## 2012-01-03 NOTE — Assessment & Plan Note (Signed)
Adequate control on present rx, reviewed  

## 2012-02-09 ENCOUNTER — Encounter: Payer: Self-pay | Admitting: Adult Health

## 2012-02-09 ENCOUNTER — Telehealth: Payer: Self-pay | Admitting: Internal Medicine

## 2012-02-09 ENCOUNTER — Ambulatory Visit (INDEPENDENT_AMBULATORY_CARE_PROVIDER_SITE_OTHER): Payer: BC Managed Care – PPO | Admitting: Adult Health

## 2012-02-09 VITALS — BP 144/88 | HR 70 | Temp 98.4°F | Ht 68.0 in | Wt 204.0 lb

## 2012-02-09 DIAGNOSIS — J209 Acute bronchitis, unspecified: Secondary | ICD-10-CM | POA: Insufficient documentation

## 2012-02-09 MED ORDER — HYDROCODONE-HOMATROPINE 5-1.5 MG/5ML PO SYRP: 5.0000 mL | ORAL_SOLUTION | Freq: Four times a day (QID) | ORAL | Status: AC | PRN

## 2012-02-09 MED ORDER — AZITHROMYCIN 250 MG PO TABS: ORAL_TABLET | ORAL | Status: AC

## 2012-02-09 NOTE — Assessment & Plan Note (Signed)
Zpack take as directed .  Mucinex  Twice daily  As needed  cough Delsym 2 tsp Twice daily  As needed  Cough.  Allegra 180mg daily for drainage As needed   Saline nasal rinses As needed   Tylenol As needed   Hydromet 1-2 tsp every 4-6hr As needed  Cough- may make you sleepy  Fluids and rest  Please contact office for sooner follow up if symptoms do not improve or worsen or seek emergency care   

## 2012-02-09 NOTE — Patient Instructions (Addendum)
Zpack take as directed .  Mucinex  Twice daily  As needed  cough Delsym 2 tsp Twice daily  As needed  Cough.  Allegra 180mg  daily for drainage As needed   Saline nasal rinses As needed   Tylenol As needed   Hydromet 1-2 tsp every 4-6hr As needed  Cough- may make you sleepy  Fluids and rest  Please contact office for sooner follow up if symptoms do not improve or worsen or seek emergency care

## 2012-02-09 NOTE — Progress Notes (Signed)
Patient ID: AUGUST LONGEST, male   DOB: 05/10/1955   MRN: [id number] Brief patient profile:  56yobm quit smoking in early 1980's with hypertension hyperlipidemia and atypical chest pain with a left heart catheter showing 60% obstruction of the right-sided branches in the 1999 and a negative stress study in May of 2007> cabg required 10/2010    08/12/2010 ov/[name]  Cc new onset cp comes and goes x 1 week so went to ER July 30th with neg w/u and much less cp since er.  Location = Left of sternum only, aching but   No relation to activity, lasts no more than a few seconds, not noticeable supine or sleeping.  Does ok with exercise with no pains. rec Cardiac eval    DATE OF ADMISSION: 10/18/2010  DATE OF DISCHARGE: 10/24/2010  DISCHARGE SUMMARY  PRIMARY ADMITTING DIAGNOSIS: Chest pain.  ADDITIONAL/DISCHARGE DIAGNOSES:  1. Coronary artery disease.  2. New-onset angina.  3. Hypertension.  4. Hyperlipidemia.  5. Remote history of tobacco abuse.  6. Postoperative acute blood loss anemia.  PROCEDURES PERFORMED:  1. Cardiac catheterization.  2. Coronary artery bypass grafting x4 (left internal mammary artery to  the LAD, saphenous vein graft to the second diagonal, saphenous  vein graft to the first obtuse marginal, saphenous vein graft to  the posterior descending).  3. Endoscopic vein harvest, right leg  04/04/2011 f/u ov/[name] cc new L lat localized hip pain worse standing up, 3-4 weeks comes and goes, not tried nsaids, maybe worse toward end of day rec Try advil up to 3 with meals to see if helps hip pain and if not better within a week call for referral to an orthopedic surgeon> referred   01/02/2012 f/u ov/[name] f/u hbp, hyperlipidemia complicated by CAD s/p cabg 10/19/10. No cp, tia or claudication symptoms >>no changed   02/09/2012 Acute OV  Complains of cough occasionally producing light green mucus, head congestion w/ same colored mucus, some PND x1week - denies wheezing, SOB,  tightness, f/c/s.  Delysm is not helping .  He denies any hemoptysis, orthopnea, PND, or leg swelling. Has had no recent travel, antibiotic use. Complains over the last 24 hours. Cough has worsened with thick mucus         Allergies  1) ! Codeine    Past Medical History:  HYPERTENSION, BENIGN ESSENTIAL, CONTROLLED (ICD-401.1)  OBESITY  - Target wt = 190 for BMI < 30  Colon polyps....................................................................................................[name]  - colonoscopy 4/04, letter sent 05/15/07 reviewed with pt July 17, 2008 and August 03, 2009 > referred back to GI  CHEST PAIN (ICD-786.50)  - s/p cabg 10/19/2010 HYPERLIPIDEMIA  - target LDL less than 70 based on documented ischemic heart disease  HEALTH MAINTENANCE..........................................................................................[name]  - TD April 2009  - CPX  04/04/11  Family History:  hypertension in mother and father no premature heart disease in any direct relatives nor stroke diabetes or cancer, except that his mother has developed leukemia  1 brother healthy   Social History:  quit smoking about 1983 denies alcohol use. 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  HEENT:   No headaches,  Difficulty swallowing,  Tooth/dental problems, or  Sore throat,                No sneezing, itching, ear ache, + nasal congestion,  post nasal drip,   CV:  No chest pain,  Orthopnea, PND, swelling in lower extremities, anasarca, dizziness, palpitations, syncope.   GI  No heartburn, indigestion, abdominal pain, nausea, vomiting, diarrhea, change in bowel habits, loss of appetite, bloody stools.   Resp:   No coughing up of blood.     No chest wall deformity  Skin: no rash or lesions.  GU: no dysuria, change in color of urine, no urgency or frequency.  No flank  pain, no hematuria   MS:  No joint pain or swelling.  No decreased range of motion.  No back pain.  Psych:  No change in mood or affect. No depression or anxiety.  No memory loss.            Objective:   Physical Exam  Ambulatory healthy appearing black male in no acute distress.  wt 201 November 20, 2007 > 205 April 18, 2008 > 184 01/03/2011 > 182 04/04/2011 > 07/04/2011  185 >189 09/19/2011 > 01/02/2012 194 >204 02/09/2012   GEN: A/Ox3; pleasant , NAD  HEENT:  [place]/AT,  EACs-clear, TMs-wnl, NOSE-clear drainage  THROAT-clear, no lesions, no postnasal drip or exudate noted.   NECK:  Supple w/ fair ROM; no JVD; normal carotid impulses w/o bruits; no thyromegaly or nodules palpated; no lymphadenopathy.  RESP  Clear  P & A; w/o, wheezes/ rales/ or rhonchi.no accessory muscle use, no dullness to percussion  CARD:  RRR, no m/r/g  , no peripheral edema, pulses intact, no cyanosis or clubbing.  GI:   Soft & nt; nml bowel sounds; no organomegaly or masses detected.  Musco: Warm bil, no deformities or joint swelling noted.   Neuro: alert, no focal deficits noted.    Skin: Warm, no lesions or rashes             Assessment:         Plan:

## 2012-02-09 NOTE — Telephone Encounter (Signed)
Last OV 01/02/12.  Pt c/o cough with greenish mucus for 1 week now. He doesn't think he has had any fever. Pt has been sch to see TP today at 2:45 for evaluation of symptoms.

## 2012-02-09 NOTE — Telephone Encounter (Signed)
(  continued) or if the pt should just let this run it's course.  Pt's pharmacy is [facility] on [address]  Antionette Fairy

## 2012-04-04 ENCOUNTER — Encounter: Payer: Self-pay | Admitting: Adult Health

## 2012-04-04 ENCOUNTER — Ambulatory Visit (INDEPENDENT_AMBULATORY_CARE_PROVIDER_SITE_OTHER): Payer: BC Managed Care – PPO | Admitting: Adult Health

## 2012-04-04 ENCOUNTER — Telehealth: Payer: Self-pay | Admitting: Internal Medicine

## 2012-04-04 VITALS — BP 136/72 | HR 66 | Temp 97.8°F | Ht 68.0 in | Wt 196.4 lb

## 2012-04-04 DIAGNOSIS — R1032 Left lower quadrant pain: Secondary | ICD-10-CM | POA: Insufficient documentation

## 2012-04-04 MED ORDER — TRAMADOL HCL 50 MG PO TABS: 50.0000 mg | ORAL_TABLET | Freq: Four times a day (QID) | ORAL | Status: DC | PRN

## 2012-04-04 MED ORDER — MELOXICAM 15 MG PO TABS: 15.0000 mg | ORAL_TABLET | Freq: Every day | ORAL | Status: DC | PRN

## 2012-04-04 NOTE — Assessment & Plan Note (Signed)
Left groin pain ? Strain   Plan  Stop Advil  May use Mobic 15mg  daily w/ food for 1 week then As needed  For joint pain  Tramadol As needed  Pain  Alternate Ice and Heat to area .  Advance activity as tolerated.  Please contact office for sooner follow up if symptoms do not improve or worsen or seek emergency care

## 2012-04-04 NOTE — Telephone Encounter (Signed)
Spoke with pt He is c/o sharp pain in his left upper thigh x 2 wks Has not done anything to injure himself Taking advil with no relief OV with TP today at 11 am

## 2012-04-04 NOTE — Progress Notes (Signed)
Patient ID: MANG HAZELRIGG, male   DOB: [birth year]   MRN: [id number] Brief patient profile:  56yobm quit smoking in early 1980's with hypertension hyperlipidemia and atypical chest pain with a left heart catheter showing 60% obstruction of the right-sided branches in the 1999 and a negative stress study in May of 2007> cabg required 10/2010    08/12/2010 ov/[name]  Cc new onset cp comes and goes x 1 week so went to ER July 30th with neg w/u and much less cp since er.  Location = Left of sternum only, aching but   No relation to activity, lasts no more than a few seconds, not noticeable supine or sleeping.  Does ok with exercise with no pains. rec Cardiac eval    DATE OF ADMISSION: 10/18/2010  DATE OF DISCHARGE: 10/24/2010  DISCHARGE SUMMARY  PRIMARY ADMITTING DIAGNOSIS: Chest pain.  ADDITIONAL/DISCHARGE DIAGNOSES:  1. Coronary artery disease.  2. New-onset angina.  3. Hypertension.  4. Hyperlipidemia.  5. Remote history of tobacco abuse.  6. Postoperative acute blood loss anemia.  PROCEDURES PERFORMED:  1. Cardiac catheterization.  2. Coronary artery bypass grafting x4 (left internal mammary artery to  the LAD, saphenous vein graft to the second diagonal, saphenous  vein graft to the first obtuse marginal, saphenous vein graft to  the posterior descending).  3. Endoscopic vein harvest, right leg  04/04/2011 f/u ov/[name] cc new L lat localized hip pain worse standing up, 3-4 weeks comes and goes, not tried nsaids, maybe worse toward end of day rec Try advil up to 3 with meals to see if helps hip pain and if not better within a week call for referral to an orthopedic surgeon> referred   01/02/2012 f/u ov/[name] f/u hbp, hyperlipidemia complicated by CAD s/p cabg 10/19/10. No cp, tia or claudication symptoms >>no changed   02/09/2012 Acute OV  Complains of cough occasionally producing light green mucus, head congestion w/ same colored mucus, some PND x1week - denies wheezing, SOB,  tightness, f/c/s.  Delysm is not helping .  He denies any hemoptysis, orthopnea, PND, or leg swelling. Has had no recent travel, antibiotic use. Complains over the last 24 hours. Cough has worsened with thick mucus >>zpack   04/04/2012 Acute OV  Complains of sharp pain in left inner thigh to groin x2 weeks.  Hurts to bend leg, cross leg . Sore to touch . No known injury. No back pain . No testicle pain . No urinary symptoms.  Area feels sore along the left inner upper thigh and inner groin.  No fever, chest pain, urinary symptoms, syncope .  Has been using advil 200mg  intermittently .     Allergies  1) ! Codeine    Past Medical History:  HYPERTENSION, BENIGN ESSENTIAL, CONTROLLED (ICD-401.1)  OBESITY  - Target wt = 190 for BMI < 30  Colon polyps....................................................................................................[name]  - colonoscopy 4/04, letter sent 05/15/07 reviewed with pt July 17, 2008 and August 03, 2009 > referred back to GI  CHEST PAIN (ICD-786.50)  - s/p cabg 10/19/2010 HYPERLIPIDEMIA  - target LDL less than 70 based on documented ischemic heart disease  HEALTH MAINTENANCE..........................................................................................[name]  - TD April 2009  - CPX  04/04/11  Family History:  hypertension in mother and father no premature heart disease in any direct relatives nor stroke diabetes or cancer, except that his mother has developed leukemia  1 brother healthy   Social History:  quit smoking about 1983 denies alcohol use. 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  HEENT:   No headaches,  Difficulty swallowing,  Tooth/dental problems, or  Sore throat,                No sneezing, itching, ear ache, + nasal congestion, post nasal drip,   CV:  No chest pain,  Orthopnea, PND, swelling in  lower extremities, anasarca, dizziness, palpitations, syncope.   GI  No heartburn, indigestion, abdominal pain, nausea, vomiting, diarrhea, change in bowel habits, loss of appetite, bloody stools.   Resp:   No coughing up of blood.     No chest wall deformity  Skin: no rash or lesions.  GU: no dysuria, change in color of urine, no urgency or frequency.  No flank pain, no hematuria   MS:  No joint pain or swelling.  No decreased range of motion.  No back pain.  Psych:  No change in mood or affect. No depression or anxiety.  No memory loss.            Objective:   Physical Exam  Ambulatory healthy appearing black male in no acute distress.  wt 201 November 20, 2007 > 205 April 18, 2008 > 184 01/03/2011 > 182 04/04/2011 > 07/04/2011  185 >189 09/19/2011 > 01/02/2012 194 >204 02/09/2012 >196 04/04/2012   GEN: A/Ox3; pleasant , NAD  HEENT:  [place]/AT,  EACs-clear, TMs-wnl, NOSE-clear drainage  THROAT-clear, no lesions, no postnasal drip or exudate noted.   NECK:  Supple w/ fair ROM; no JVD; normal carotid impulses w/o bruits; no thyromegaly or nodules palpated; no lymphadenopathy.  RESP  Clear  P & A; w/o, wheezes/ rales/ or rhonchi.no accessory muscle use, no dullness to percussion  CARD:  RRR, no m/r/g  , no peripheral edema, pulses intact, no cyanosis or clubbing.  GI:   Soft & nt; nml bowel sounds; no organomegaly or masses detected.  GU: testes well descended , no masses noted. No palpable masses noted. No adenopathy noted.   Musco: Warm bil, no deformities or joint swelling noted.  Tender along right upper medial thigh, no deformity or rash noted. Painful external/internal ROM. , nml strength, neg SLR.   Neuro: alert, no focal deficits noted.    Skin: Warm, no lesions or rashes             Assessment:         Plan:

## 2012-04-04 NOTE — Patient Instructions (Addendum)
Stop Advil  May use Mobic 15mg  daily w/ food for 1 week then As needed  For joint pain  Tramadol As needed  Pain  Alternate Ice and Heat to area .  Advance activity as tolerated.  Please contact office for sooner follow up if symptoms do not improve or worsen or seek emergency care

## 2012-04-05 ENCOUNTER — Other Ambulatory Visit: Payer: Self-pay | Admitting: Internal Medicine

## 2012-04-07 ENCOUNTER — Other Ambulatory Visit: Payer: Self-pay | Admitting: Internal Medicine

## 2012-04-25 ENCOUNTER — Ambulatory Visit (INDEPENDENT_AMBULATORY_CARE_PROVIDER_SITE_OTHER)
Admission: RE | Admit: 2012-04-25 | Discharge: 2012-04-25 | Disposition: A | Discharge status: Home / Self Care | Payer: BC Managed Care – PPO | Source: Ambulatory Visit | Attending: Internal Medicine | Admitting: Internal Medicine

## 2012-04-25 ENCOUNTER — Encounter: Payer: Self-pay | Admitting: Internal Medicine

## 2012-04-25 ENCOUNTER — Other Ambulatory Visit (INDEPENDENT_AMBULATORY_CARE_PROVIDER_SITE_OTHER): Payer: BC Managed Care – PPO

## 2012-04-25 ENCOUNTER — Ambulatory Visit (INDEPENDENT_AMBULATORY_CARE_PROVIDER_SITE_OTHER): Payer: BC Managed Care – PPO | Admitting: Internal Medicine

## 2012-04-25 VITALS — BP 128/84 | HR 59 | Temp 96.2°F | Ht 69.0 in | Wt 193.8 lb

## 2012-04-25 DIAGNOSIS — M25552 Pain in left hip: Secondary | ICD-10-CM

## 2012-04-25 DIAGNOSIS — Z0000 Encounter for general adult medical examination without abnormal findings: Secondary | ICD-10-CM

## 2012-04-25 DIAGNOSIS — I10 Essential (primary) hypertension: Secondary | ICD-10-CM

## 2012-04-25 DIAGNOSIS — E7800 Pure hypercholesterolemia, unspecified: Secondary | ICD-10-CM

## 2012-04-25 DIAGNOSIS — M25559 Pain in unspecified hip: Secondary | ICD-10-CM

## 2012-04-25 LAB — CBC WITH DIFFERENTIAL/PLATELET
Basophils Absolute: 0 10*3/uL (ref 0.0–0.1)
Basophils Relative: 0.6 % (ref 0.0–3.0)
Eosinophils Absolute: 0.2 10*3/uL (ref 0.0–0.7)
Eosinophils Relative: 2.7 % (ref 0.0–5.0)
HCT: 42 % (ref 39.0–52.0)
Hemoglobin: 14 g/dL (ref 13.0–17.0)
Lymphocytes Relative: 29.2 % (ref 12.0–46.0)
Lymphs Abs: 1.7 10*3/uL (ref 0.7–4.0)
MCHC: 33.4 g/dL (ref 30.0–36.0)
MCV: 91.4 fl (ref 78.0–100.0)
Monocytes Absolute: 0.5 10*3/uL (ref 0.1–1.0)
Monocytes Relative: 7.8 % (ref 3.0–12.0)
Neutro Abs: 3.5 10*3/uL (ref 1.4–7.7)
Neutrophils Relative %: 59.7 % (ref 43.0–77.0)
Platelets: 234 10*3/uL (ref 150.0–400.0)
RBC: 4.6 Mil/uL (ref 4.22–5.81)
RDW: 13.4 % (ref 11.5–14.6)
WBC: 5.9 10*3/uL (ref 4.5–10.5)

## 2012-04-25 LAB — HEPATIC FUNCTION PANEL
ALT: 36 U/L (ref 0–53)
AST: 28 U/L (ref 0–37)
Albumin: 4.4 g/dL (ref 3.5–5.2)
Alkaline Phosphatase: 50 U/L (ref 39–117)
Bilirubin, Direct: 0.1 mg/dL (ref 0.0–0.3)
Total Bilirubin: 0.8 mg/dL (ref 0.3–1.2)
Total Protein: 7.2 g/dL (ref 6.0–8.3)

## 2012-04-25 LAB — BASIC METABOLIC PANEL
BUN: 28 mg/dL — ABNORMAL HIGH (ref 6–23)
CO2: 29 mEq/L (ref 19–32)
Calcium: 9.4 mg/dL (ref 8.4–10.5)
Chloride: 102 mEq/L (ref 96–112)
Creatinine, Ser: 0.9 mg/dL (ref 0.4–1.5)
GFR: 107.44 mL/min (ref >60.00)
Glucose, Bld: 93 mg/dL (ref 70–99)
Potassium: 4.6 mEq/L (ref 3.5–5.1)
Sodium: 139 mEq/L (ref 135–145)

## 2012-04-25 LAB — URINALYSIS
Bilirubin Urine: NEGATIVE
Ketones, ur: NEGATIVE
Leukocytes, UA: NEGATIVE
Nitrite: NEGATIVE
Specific Gravity, Urine: >=1.03 (ref 1.000–1.030)
Urine Glucose: NEGATIVE
Urobilinogen, UA: 0.2 (ref 0.0–1.0)
pH: 6 (ref 5.0–8.0)

## 2012-04-25 LAB — PSA: PSA: 0.53 ng/mL (ref 0.10–4.00)

## 2012-04-25 LAB — LIPID PANEL
Cholesterol: 136 mg/dL (ref 0–200)
HDL: 31.2 mg/dL — ABNORMAL LOW (ref >39.00)
LDL Cholesterol: 89 mg/dL (ref 0–99)
Total CHOL/HDL Ratio: 4
Triglycerides: 78 mg/dL (ref 0.0–149.0)
VLDL: 15.6 mg/dL (ref 0.0–40.0)

## 2012-04-25 LAB — TSH: TSH: 1.18 u[IU]/mL (ref 0.35–5.50)

## 2012-04-25 NOTE — Patient Instructions (Addendum)
Please remember to go to the lab and x-ray department downstairs for your tests - we will call you with the results when they are available.  Call Dr Charlann Boxer for follow up of your hip pain but ok to continue the mobic in meantime as needed  Please schedule a follow up visit in 6 months but call sooner if needed

## 2012-04-25 NOTE — Progress Notes (Signed)
Patient ID: [name] [name], male   DOB: July 26, 1955   MRN: [id number] Brief patient profile:  56yobm quit smoking in early 1980's with hypertension hyperlipidemia and atypical chest pain with a left heart catheter showing 60% obstruction of the right-sided branches in the 1999 and a negative stress study in May of 2007> cabg required 10/2010    08/12/2010 ov/[name] [name]  Cc new onset cp comes and goes x 1 week so went to ER July 30th with neg w/u and much less cp since er.  Location = Left of sternum only, aching but   No relation to activity, lasts no more than a few seconds, not noticeable supine or sleeping.  Does ok with exercise with no pains. rec Cardiac eval    DATE OF ADMISSION: 10/18/2010  DATE OF DISCHARGE: 10/24/2010  DISCHARGE SUMMARY  PRIMARY ADMITTING DIAGNOSIS: Chest pain.  ADDITIONAL/DISCHARGE DIAGNOSES:  1. Coronary artery disease.  2. New-onset angina.  3. Hypertension.  4. Hyperlipidemia.  5. Remote history of tobacco abuse.  6. Postoperative acute blood loss anemia.  PROCEDURES PERFORMED:  1. Cardiac catheterization.  2. Coronary artery bypass grafting x4 (left internal mammary artery to  the LAD, saphenous vein graft to the second diagonal, saphenous  vein graft to the first obtuse marginal, saphenous vein graft to  the posterior descending).  3. Endoscopic vein harvest, right leg  04/04/2011 f/u ov/[name] [name] cc new L lat localized hip pain worse standing up, 3-4 weeks comes and goes, not tried nsaids, maybe worse toward end of day rec Try advil up to 3 with meals to see if helps hip pain and if not better within a week call for referral to an orthopedic surgeon> referred   01/02/2012 f/u ov/[name] [name] f/u hbp, hyperlipidemia complicated by CAD s/p cabg 10/19/10. No cp, tia or claudication symptoms >>no changed   02/09/2012 Acute OV  Complains of cough occasionally producing light green mucus, head congestion w/ same colored mucus, some PND x1week - denies wheezing, SOB,  tightness, f/c/s.  Delysm is not helping .  He denies any hemoptysis, orthopnea, PND, or leg swelling. Has had no recent travel, antibiotic use. Complains over the last 24 hours. Cough has worsened with thick mucus >>zpack   04/04/2012 Acute OV  Complains of sharp pain in left inner thigh to groin x2 weeks.  Hurts to bend leg, cross leg . Sore to touch . No known injury. No back pain . No testicle pain . No urinary symptoms.  Area feels sore along the left inner upper thigh and inner groin.  No fever, chest pain, urinary symptoms, syncope .  Has been using advil 200mg  intermittently .  rec Stop Advil  May use Mobic 15mg  daily w/ food for 1 week then As needed  For joint pain  Tramadol As needed  Pain  Alternate Ice and Heat to area   04/25/2012 f/u ov/[name] [name] comprehensive eval hbp, hyperlipidemia, djd L hip Chief Complaint  Patient presents with  . Annual Exam    Pt states left hip pain no better since last visit. Otherwise doing well.    some better with meloxicam, worse when internally rotating and certain exercises s radicular symptoms.  Sleeping ok without nocturnal  or early am exacerbation  of respiratory  c/o's or need for noct saba. Also denies any obvious fluctuation of symptoms with weather or environmental changes or other aggravating or alleviating factors except as outlined above   ROS  The following are not active complaints unless bolded sore throat, dysphagia,  dental problems, itching, sneezing,  nasal congestion or excess/ purulent secretions, ear ache,   fever, chills, sweats, unintended wt loss, pleuritic or exertional cp, hemoptysis,  orthopnea pnd or leg swelling, presyncope, palpitations, heartburn, abdominal pain, anorexia, nausea, vomiting, diarrhea  or change in bowel or urinary habits, change in stools or urine, dysuria,hematuria,  rash, arthralgias, visual complaints, headache, numbness weakness or ataxia or problems with walking or coordination,  change in  mood/affect or memory.       Allergies  1) ! Codeine    Past Medical History:  HYPERTENSION, BENIGN ESSENTIAL, CONTROLLED (ICD-401.1)  OBESITY  - Target wt = 190 for BMI < 30  Colon polyps....................................................................................................[name]  - colonoscopy 4/04, letter sent 05/15/07 reviewed with pt July 17, 2008 and August 03, 2009 > referred back to GI  CHEST PAIN (ICD-786.50)  - s/p cabg 10/19/2010 HYPERLIPIDEMIA  - target LDL less than 70 based on documented ischemic heart disease  HEALTH MAINTENANCE..........................................................................................[name]  - TD April 2009  - CPX  04/25/2012   Family History:  hypertension in mother and father no premature heart disease in any direct relatives nor stroke diabetes or cancer, except that his mother has developed leukemia  1 brother healthy   Social History:  quit smoking about 1983 denies alcohol use. 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  wt 201 November 20, 2007 > 205 April 18, 2008 > 184 01/03/2011 > 182 04/04/2011 > 07/04/2011  185 >189 09/19/2011 > 01/02/2012 194 >204 02/09/2012 >196 04/04/2012 > 04/25/2012 194   GEN: A/Ox3; pleasant , NAD  HEENT:  [place]/AT,  EACs-clear, TMs-wnl, NOSE-clear drainage  THROAT-clear, no lesions, no postnasal drip or exudate noted.   NECK:  Supple w/ fair ROM; no JVD; normal carotid impulses w/o bruits; no thyromegaly or nodules palpated; no lymphadenopathy.  RESP  Clear  P & A; w/o, wheezes/ rales/ or rhonchi.no accessory muscle use, no dullness to percussion  CARD:  RRR, no m/r/g  , no peripheral edema, pulses intact, no cyanosis or clubbing.  GI:   Soft & nt; nml bowel sounds; no organomegaly or masses detected.  GU: Uncirc, testes well descended, ,   No palpable masses  noted. No adenopathy noted.  Neg IH  Rectal:  Mild bph no nodules, stool g neg   Musco: Warm bil, no deformities or joint swelling noted.  Limited int rot L hip with neg slr, nl gait and heel/toe walk  .  Neuro: alert, no focal deficits noted.  No path reflexes  Skin: Warm, no lesions or rashes     CXR  04/25/2012 :    Status post CABG again noted. The cardiomediastinal  silhouette is stable. Elevation of the right hemidiaphragm. No  acute infiltrate or pleural effusion. No pulmonary edema. Bony  thorax is stable   IMPRESSION:  No active disease. No significant change.     Assessment:         Plan:

## 2012-04-26 NOTE — Progress Notes (Signed)
Quick Note:  LMTCB ______ 

## 2012-04-27 NOTE — Assessment & Plan Note (Signed)
Referred back to ortho as not responding to nsaids

## 2012-04-27 NOTE — Assessment & Plan Note (Signed)
Adequate control on present rx, reviewed  

## 2012-04-27 NOTE — Assessment & Plan Note (Signed)
-   Target LDL < 70 since has known CAD  Lab Results  Component Value Date   CHOL 136 04/25/2012   HDL 31.20* 04/25/2012   LDLCALC 89 04/25/2012   TRIG 78.0 04/25/2012   CHOLHDL 4 04/25/2012    Adequate control on present rx, reviewed rx with statins, no change rx

## 2012-04-30 ENCOUNTER — Telehealth: Payer: Self-pay | Admitting: Internal Medicine

## 2012-04-30 NOTE — Progress Notes (Signed)
Quick Note:  Spoke with pt and notified of results per Dr. [name]. Pt verbalized understanding and denied any questions.  ______ 

## 2012-04-30 NOTE — Telephone Encounter (Signed)
Spoke with pt and notified of results per Dr. [name]. Pt verbalized understanding and denied any questions. 

## 2012-05-16 ENCOUNTER — Ambulatory Visit (INDEPENDENT_AMBULATORY_CARE_PROVIDER_SITE_OTHER): Payer: BC Managed Care – PPO | Admitting: Cardiovascular Disease

## 2012-05-16 ENCOUNTER — Encounter: Payer: Self-pay | Admitting: Cardiovascular Disease

## 2012-05-16 VITALS — BP 144/94 | HR 60 | Ht 69.0 in | Wt 193.8 lb

## 2012-05-16 DIAGNOSIS — M25559 Pain in unspecified hip: Secondary | ICD-10-CM

## 2012-05-16 DIAGNOSIS — I259 Chronic ischemic heart disease, unspecified: Secondary | ICD-10-CM

## 2012-05-16 DIAGNOSIS — E7800 Pure hypercholesterolemia, unspecified: Secondary | ICD-10-CM

## 2012-05-16 DIAGNOSIS — M25552 Pain in left hip: Secondary | ICD-10-CM

## 2012-05-16 DIAGNOSIS — I10 Essential (primary) hypertension: Secondary | ICD-10-CM

## 2012-05-16 NOTE — Progress Notes (Signed)
Patient ID: [name] [name], male   DOB: [birth year], 57 y.o.   MRN: [id number] S/P CABG 10/19/10 [name] [name] F/U CAD  SURGICAL PROCEDURE: Coronary artery bypass grafting x4 with the left  internal mammary to the left anterior descending coronary artery,  reverse saphenous vein graft to the second diagonal coronary artery,  reverse saphenous vein graft to the first obtuse marginal, reverse  saphenous vein graft to the posterior descending coronary artery with  right leg endovein harvesting.   Back riding motorcycle.  Has right hip pain that he needs to see [facility] for Meloxicam not helpful    ROS: Denies fever, malais, weight loss, blurry vision, decreased visual acuity, cough, sputum, SOB, hemoptysis, pleuritic pain, palpitaitons, heartburn, abdominal pain, melena, lower extremity edema, claudication, or rash.  All other systems reviewed and negative  General: Affect appropriate Healthy:  appears stated age HEENT: normal Neck supple with no adenopathy JVP normal no bruits no thyromegaly Lungs clear with no wheezing and good diaphragmatic motion Heart:  S1/S2 no murmur, no rub, gallop or click PMI normal Abdomen: benighn, BS positve, no tenderness, no AAA no bruit.  No HSM or HJR Distal pulses intact with no bruits No edema Neuro non-focal Skin warm and dry No muscular weakness   Current Outpatient Prescriptions  Medication Sig Dispense Refill  . acetaminophen (TYLENOL) 500 MG tablet Take 1,000 mg by mouth every 6 (six) hours as needed. pain      . aspirin 325 MG EC tablet Take 325 mg by mouth daily.        . chlorpheniramine (CHLOR-TRIMETON) 4 MG tablet Take 4 mg by mouth 2 (two) times daily as needed.      . clotrimazole-betamethasone (LOTRISONE) cream Apply topically 2 (two) times daily as needed.        Marland Kitchen guaiFENesin-dextromethorphan (ROBITUSSIN DM) 100-10 MG/5ML syrup as needed. Cough or congestion      . losartan (COZAAR) 100 MG tablet TAKE 1 TABLET EVERY DAY  30 tablet  11  .  meloxicam (MOBIC) 15 MG tablet Take 1 tablet (15 mg total) by mouth daily as needed for pain.  30 tablet  3  . metoprolol tartrate (LOPRESSOR) 25 MG tablet TAKE 1 TABLET (25 MG TOTAL) BY MOUTH 2 (TWO) TIMES DAILY.  60 tablet  11  . nitroGLYCERIN (NITROSTAT) 0.4 MG SL tablet Place 1 tablet (0.4 mg total) under the tongue every 5 (five) minutes as needed for chest pain.  25 tablet  4  . Polyethylene Glycol 3350 (MIRALAX PO) Take 17 g/day by mouth daily as needed. constipation      . simvastatin (ZOCOR) 40 MG tablet TAKE 1 TABLET AT BEDTIME  30 tablet  11  . sodium chloride (OCEAN) 0.65 % nasal spray Place 2 sprays into the nose every 6 (six) hours as needed. Nasal congestion      . traMADol (ULTRAM) 50 MG tablet Take 1 tablet (50 mg total) by mouth every 6 (six) hours as needed. Maximum dose= 8 tablets per day For pain  30 tablet  1   No current facility-administered medications for this visit.    Allergies  Codeine  Electrocardiogram:  Assessment and Plan

## 2012-05-16 NOTE — Assessment & Plan Note (Signed)
Stable with no angina and good activity level.  Continue medical Rx  

## 2012-05-16 NOTE — Assessment & Plan Note (Signed)
F/U Dr Charlann Boxer possible labral tear May benefit from MRI

## 2012-05-16 NOTE — Assessment & Plan Note (Signed)
Well controlled.  Continue current medications and low sodium Dash type diet.    

## 2012-05-16 NOTE — Patient Instructions (Signed)
Your physician wants you to follow-up in:  6 MONTHS WITH DR NISHAN  You will receive a reminder letter in the mail two months in advance. If you don't receive a letter, please call our office to schedule the follow-up appointment. Your physician recommends that you continue on your current medications as directed. Please refer to the Current Medication list given to you today. 

## 2012-05-16 NOTE — Assessment & Plan Note (Signed)
Cholesterol is at goal.  Continue current dose of statin and diet Rx.  No myalgias or side effects.  F/U  LFT's in 6 months. Lab Results  Component Value Date   LDLCALC 89 04/25/2012

## 2012-09-06 ENCOUNTER — Other Ambulatory Visit: Payer: Self-pay | Admitting: Internal Medicine

## 2012-11-02 ENCOUNTER — Ambulatory Visit (INDEPENDENT_AMBULATORY_CARE_PROVIDER_SITE_OTHER): Payer: BC Managed Care – PPO | Admitting: Internal Medicine

## 2012-11-02 ENCOUNTER — Other Ambulatory Visit (INDEPENDENT_AMBULATORY_CARE_PROVIDER_SITE_OTHER): Payer: BC Managed Care – PPO

## 2012-11-02 ENCOUNTER — Encounter: Payer: Self-pay | Admitting: Internal Medicine

## 2012-11-02 VITALS — BP 122/84 | HR 52 | Temp 98.0°F | Ht 69.0 in | Wt 193.0 lb

## 2012-11-02 DIAGNOSIS — R232 Flushing: Secondary | ICD-10-CM

## 2012-11-02 DIAGNOSIS — E7800 Pure hypercholesterolemia, unspecified: Secondary | ICD-10-CM

## 2012-11-02 DIAGNOSIS — M25559 Pain in unspecified hip: Secondary | ICD-10-CM

## 2012-11-02 DIAGNOSIS — I10 Essential (primary) hypertension: Secondary | ICD-10-CM

## 2012-11-02 DIAGNOSIS — M25552 Pain in left hip: Secondary | ICD-10-CM

## 2012-11-02 LAB — LIPID PANEL
Cholesterol: 128 mg/dL (ref 0–200)
HDL: 39 mg/dL — ABNORMAL LOW (ref >39.00)
LDL Cholesterol: 71 mg/dL (ref 0–99)
Total CHOL/HDL Ratio: 3
Triglycerides: 89 mg/dL (ref 0.0–149.0)
VLDL: 17.8 mg/dL (ref 0.0–40.0)

## 2012-11-02 LAB — HEPATIC FUNCTION PANEL
ALT: 34 U/L (ref 0–53)
AST: 26 U/L (ref 0–37)
Albumin: 4.3 g/dL (ref 3.5–5.2)
Alkaline Phosphatase: 47 U/L (ref 39–117)
Bilirubin, Direct: 0.1 mg/dL (ref 0.0–0.3)
Total Bilirubin: 0.7 mg/dL (ref 0.3–1.2)
Total Protein: 7.2 g/dL (ref 6.0–8.3)

## 2012-11-02 NOTE — Patient Instructions (Addendum)
Please remember to go to the lab   x-ray department downstairs for your tests - we will call you with the results when they are available.  CPX due after 04/24/12

## 2012-11-02 NOTE — Progress Notes (Signed)
Patient ID: [name] [name], male   DOB: March 02, 1955   MRN: [id number] Brief patient profile:  56yobm quit smoking in early 1980's with hypertension hyperlipidemia and atypical chest pain with a left heart catheter showing 60% obstruction of the right-sided branches in the 1999 and a negative stress study in May of 2007> cabg required 10/2010    08/12/2010 ov/Tania Perrott  Cc new onset cp comes and goes x 1 week so went to ER July 30th with neg w/u and much less cp since er.  Location = Left of sternum only, aching but   No relation to activity, lasts no more than a few seconds, not noticeable supine or sleeping.  Does ok with exercise with no pains. rec Cardiac eval    DATE OF ADMISSION: 10/18/2010  DATE OF DISCHARGE: 10/24/2010  DISCHARGE SUMMARY  PRIMARY ADMITTING DIAGNOSIS: Chest pain.  ADDITIONAL/DISCHARGE DIAGNOSES:  1. Coronary artery disease.  2. New-onset angina.  3. Hypertension.  4. Hyperlipidemia.  5. Remote history of tobacco abuse.  6. Postoperative acute blood loss anemia.  PROCEDURES PERFORMED:  1. Cardiac catheterization.  2. Coronary artery bypass grafting x4 (left internal mammary artery to  the LAD, saphenous vein graft to the second diagonal, saphenous  vein graft to the first obtuse marginal, saphenous vein graft to  the posterior descending).  3. Endoscopic vein harvest, right leg  04/04/2011 f/u ov/Orean Giarratano cc new L lat localized hip pain worse standing up, 3-4 weeks comes and goes, not tried nsaids, maybe worse toward end of day rec Try advil up to 3 with meals to see if helps hip pain and if not better within a week call for referral to an orthopedic surgeon> referred   01/02/2012 f/u ov/Ernesteen Mihalic f/u hbp, hyperlipidemia complicated by CAD s/p cabg 10/19/10. No cp, tia or claudication symptoms >>no changed   02/09/2012 Acute OV  Complains of cough occasionally producing light green mucus, head congestion w/ same colored mucus, some PND x1week - denies wheezing, SOB,  tightness, f/c/s.  Delysm is not helping .  He denies any hemoptysis, orthopnea, PND, or leg swelling. Has had no recent travel, antibiotic use. Complains over the last 24 hours. Cough has worsened with thick mucus >>zpack   04/04/2012 Acute OV  Complains of sharp pain in left inner thigh to groin x2 weeks.  Hurts to bend leg, cross leg . Sore to touch . No known injury. No back pain . No testicle pain . No urinary symptoms.  Area feels sore along the left inner upper thigh and inner groin.  No fever, chest pain, urinary symptoms, syncope .  Has been using advil 200mg  intermittently .  rec Stop Advil  May use Mobic 15mg  daily w/ food for 1 week then As needed  For joint pain  Tramadol As needed  Pain  Alternate Ice and Heat to area   04/25/2012 f/u ov/Korver Graybeal comprehensive eval hbp, hyperlipidemia, djd L hip Chief Complaint  Patient presents with  . Annual Exam    Pt states left hip pain no better since last visit. Otherwise doing well.    some better with meloxicam, worse when internally rotating and certain exercises s radicular symptoms rec Call Dr [name] for follow up of your hip pain but ok to continue the mobic in meantime as needed  11/02/2012 f/u ov/Evanthia Maund re: hbp/ hyperlipidemia/ djd on advil  2-3 x daily  Chief Complaint  Patient presents with  . Follow-up    Pt c/o "hot flashes" on and off for the  past 2-3 months.     Fleeting, only happened a few times when got too hot, like after a shower, s nausea, cp or abd pain    No obvious day to day or daytime variabilty or assoc chronic cough  or chest tightness, subjective wheeze overt sinus or hb symptoms. No unusual exp hx or h/o childhood pna/ asthma or knowledge of premature birth.  Sleeping ok without nocturnal  or early am exacerbation  of respiratory  c/o's or need for noct saba. Also denies any obvious fluctuation of symptoms with weather or environmental changes or other aggravating or alleviating factors except as outlined  above   Current Medications, Allergies, Complete Past Medical History, Past Surgical History, Family History, and Social History were reviewed in [employer] record.  ROS  The following are not active complaints unless bolded sore throat, dysphagia, dental problems, itching, sneezing,  nasal congestion or excess/ purulent secretions, ear ache,   fever, chills, sweats, unintended wt loss, pleuritic or exertional cp, hemoptysis,  orthopnea pnd or leg swelling, presyncope, palpitations, heartburn, abdominal pain, anorexia, nausea, vomiting, diarrhea  or change in bowel or urinary habits, change in stools or urine, dysuria,hematuria,  rash, arthralgias, visual complaints, headache, numbness weakness or ataxia or problems with walking or coordination,  change in mood/affect or memory.              Past Medical History from [facility]: HYPERTENSION, BENIGN ESSENTIAL, CONTROLLED (ICD-401.1)  OBESITY  - Target wt = 190 for BMI < 30  Colon polyps....................................................................................................[name]  - colonoscopy 4/04, letter sent 05/15/07 reviewed with pt July 17, 2008 and August 03, 2009 > referred back to GI  CHEST PAIN (ICD-786.50)  - s/p cabg 10/19/2010 HYPERLIPIDEMIA  - target LDL less than 70 based on documented ischemic heart disease  HEALTH MAINTENANCE..........................................................................................[name]  - TD April 2009  - CPX  04/25/2012   Family History:  hypertension in mother and father no premature heart disease in any direct relatives nor stroke diabetes or cancer, except that his mother has developed leukemia  1 brother healthy   Social History:  quit smoking about 1983 denies alcohol use. 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   wt 201 November 20, 2007 > 205 April 18, 2008 > 184 01/03/2011 > 182 04/04/2011 > 07/04/2011  185 >189 09/19/2011 > 01/02/2012 194 >204 02/09/2012 >196 04/04/2012 > 04/25/2012 194 > 11/02/2012  193  GEN: A/Ox3; pleasant , NAD  HEENT:  [place]/AT,  EACs-clear, TMs-wnl, NOSE-clear drainage  THROAT-clear, no lesions, no postnasal drip or exudate noted.   NECK:  Supple w/ fair ROM; no JVD; normal carotid impulses w/o bruits; no thyromegaly or nodules palpated; no lymphadenopathy.  RESP  Clear  P & A; w/o, wheezes/ rales/ or rhonchi.no accessory muscle use, no dullness to percussion  CARD:  RRR, no m/r/g  , no peripheral edema, pulses intact, no cyanosis or clubbing.  GI:   Soft & nt; nml bowel sounds; no organomegaly or masses detected.  GU: Uncirc, testes well descended, ,   No palpable masses noted. No adenopathy noted.  Neg IH  Rectal:  Mild bph no nodules, stool g neg   Musco: Warm bil, no deformities or joint swelling noted.  Limited int rot L hip with neg slr, nl gait  and heel/toe walk  .  Neuro: alert, no focal deficits noted.  No path reflexes  Skin: Warm, no lesions or rashes     CXR  04/25/2012 :    Status post CABG again noted. The cardiomediastinal  silhouette is stable. Elevation of the right hemidiaphragm. No  acute infiltrate or pleural effusion. No pulmonary edema. Bony  thorax is stable   IMPRESSION:  No active disease. No significant change.     Assessment:

## 2012-11-04 DIAGNOSIS — R232 Flushing: Secondary | ICD-10-CM | POA: Insufficient documentation

## 2012-11-04 NOTE — Assessment & Plan Note (Signed)
Adequate control on present rx, reviewed > no change in rx needed   

## 2012-11-04 NOTE — Assessment & Plan Note (Signed)
Not related to exertion or meals and rarely occurring > reassurance/ observation

## 2012-11-04 NOTE — Assessment & Plan Note (Signed)
-   Target LDL < 70 since has known CAD  Lab Results  Component Value Date   CHOL 128 11/02/2012   HDL 39.00* 11/02/2012   LDLCALC 71 11/02/2012   TRIG 89.0 11/02/2012   CHOLHDL 3 11/02/2012     Adequate control on present rx, reviewed > no change in rx needed

## 2012-11-04 NOTE — Assessment & Plan Note (Signed)
Reviewed instructions on approp use of nsaids and need for ortho referral

## 2012-11-05 ENCOUNTER — Telehealth: Payer: Self-pay | Admitting: Internal Medicine

## 2012-11-05 NOTE — Telephone Encounter (Signed)
I spoke with patient about results and he verbalized understanding and had no questions 

## 2012-11-14 ENCOUNTER — Ambulatory Visit (INDEPENDENT_AMBULATORY_CARE_PROVIDER_SITE_OTHER): Payer: BC Managed Care – PPO | Admitting: Cardiovascular Disease

## 2012-11-14 ENCOUNTER — Encounter: Payer: Self-pay | Admitting: Cardiovascular Disease

## 2012-11-14 ENCOUNTER — Ambulatory Visit (HOSPITAL_COMMUNITY): Payer: BC Managed Care – PPO | Attending: Cardiovascular Disease

## 2012-11-14 VITALS — BP 136/90 | HR 54 | Ht 69.0 in | Wt 191.0 lb

## 2012-11-14 DIAGNOSIS — I10 Essential (primary) hypertension: Secondary | ICD-10-CM

## 2012-11-14 DIAGNOSIS — Z87891 Personal history of nicotine dependence: Secondary | ICD-10-CM | POA: Insufficient documentation

## 2012-11-14 DIAGNOSIS — I6529 Occlusion and stenosis of unspecified carotid artery: Secondary | ICD-10-CM | POA: Insufficient documentation

## 2012-11-14 DIAGNOSIS — R0989 Other specified symptoms and signs involving the circulatory and respiratory systems: Secondary | ICD-10-CM | POA: Insufficient documentation

## 2012-11-14 DIAGNOSIS — I658 Occlusion and stenosis of other precerebral arteries: Secondary | ICD-10-CM | POA: Insufficient documentation

## 2012-11-14 DIAGNOSIS — I2510 Atherosclerotic heart disease of native coronary artery without angina pectoris: Secondary | ICD-10-CM | POA: Insufficient documentation

## 2012-11-14 DIAGNOSIS — I25810 Atherosclerosis of coronary artery bypass graft(s) without angina pectoris: Secondary | ICD-10-CM

## 2012-11-14 DIAGNOSIS — I259 Chronic ischemic heart disease, unspecified: Secondary | ICD-10-CM

## 2012-11-14 DIAGNOSIS — Z951 Presence of aortocoronary bypass graft: Secondary | ICD-10-CM | POA: Insufficient documentation

## 2012-11-14 DIAGNOSIS — E785 Hyperlipidemia, unspecified: Secondary | ICD-10-CM | POA: Insufficient documentation

## 2012-11-14 DIAGNOSIS — E7800 Pure hypercholesterolemia, unspecified: Secondary | ICD-10-CM

## 2012-11-14 MED ORDER — NITROGLYCERIN 0.4 MG SL SUBL: 0.4000 mg | SUBLINGUAL_TABLET | SUBLINGUAL | Status: DC | PRN

## 2012-11-14 NOTE — Progress Notes (Signed)
Patient ID: [name] [name], male   DOB: September 19, 1955, 57 y.o.   MRN: [id number] S/P CABG 10/19/10 [name] F/U CAD  SURGICAL PROCEDURE: Coronary artery bypass grafting x4 with the left  internal mammary to the left anterior descending coronary artery,  reverse saphenous vein graft to the second diagonal coronary artery,  reverse saphenous vein graft to the first obtuse marginal, reverse  saphenous vein graft to the posterior descending coronary artery with  right leg endovein harvesting.   Back riding motorcycle.  Has right hip pain that he needs to see [facility] for Meloxicam not helpful Needs refill on nitro No chest paiin   ROS: Denies fever, malais, weight loss, blurry vision, decreased visual acuity, cough, sputum, SOB, hemoptysis, pleuritic pain, palpitaitons, heartburn, abdominal pain, melena, lower extremity edema, claudication, or rash.  All other systems reviewed and negative  General: Affect appropriate Healthy:  appears stated age HEENT: normal Neck supple with no adenopathy JVP normal left  bruits no thyromegaly Lungs clear with no wheezing and good diaphragmatic motion Heart:  S1/S2 no murmur, no rub, gallop or click PMI normal Abdomen: benighn, BS positve, no tenderness, no AAA no bruit.  No HSM or HJR Distal pulses intact with no bruits No edema Neuro non-focal Skin warm and dry No muscular weakness   Current Outpatient Prescriptions  Medication Sig Dispense Refill  . acetaminophen (TYLENOL) 500 MG tablet Take 1,000 mg by mouth every 6 (six) hours as needed. pain      . aspirin 325 MG EC tablet Take 325 mg by mouth daily.        . chlorpheniramine (CHLOR-TRIMETON) 4 MG tablet Take 4 mg by mouth 2 (two) times daily as needed.      . clotrimazole-betamethasone (LOTRISONE) cream Apply topically 2 (two) times daily as needed.        Marland Kitchen guaiFENesin-dextromethorphan (ROBITUSSIN DM) 100-10 MG/5ML syrup as needed. Cough or congestion      . losartan (COZAAR) 100 MG tablet TAKE 1  TABLET EVERY DAY  30 tablet  11  . meloxicam (MOBIC) 15 MG tablet Take 1 tablet (15 mg total) by mouth daily as needed for pain.  30 tablet  3  . metoprolol tartrate (LOPRESSOR) 25 MG tablet TAKE 1 TABLET (25 MG TOTAL) BY MOUTH 2 (TWO) TIMES DAILY.  60 tablet  11  . nitroGLYCERIN (NITROSTAT) 0.4 MG SL tablet Place 1 tablet (0.4 mg total) under the tongue every 5 (five) minutes as needed for chest pain.  25 tablet  4  . Polyethylene Glycol 3350 (MIRALAX PO) Take 17 g/day by mouth daily as needed. constipation      . simvastatin (ZOCOR) 40 MG tablet TAKE 1 TABLET AT BEDTIME  30 tablet  11  . sodium chloride (OCEAN) 0.65 % nasal spray Place 2 sprays into the nose every 6 (six) hours as needed. Nasal congestion      . traMADol (ULTRAM) 50 MG tablet Take 1 tablet (50 mg total) by mouth every 6 (six) hours as needed. Maximum dose= 8 tablets per day For pain  30 tablet  1   No current facility-administered medications for this visit.    Allergies  Codeine  Electrocardiogram:  01/02/12  SR rate 56 nonspecific inferolateral T wave changes  Today SR rate 54 RSR' same nonspecific ST changes  Assessment and Plan

## 2012-11-14 NOTE — Assessment & Plan Note (Signed)
Well controlled.  Continue current medications and low sodium Dash type diet.    

## 2012-11-14 NOTE — Assessment & Plan Note (Signed)
Stable with no angina and good activity level.  Continue medical Rx Nitro refill called in

## 2012-11-14 NOTE — Patient Instructions (Signed)
Your physician wants you to follow-up in:    6 MONTHS WITH DR  NISHAN  You will receive a reminder letter in the mail two months in advance. If you don't receive a letter, please call our office to schedule the follow-up appointment.  Your physician recommends that you continue on your current medications as directed. Please refer to the Current Medication list given to you today.   Your physician has requested that you have a carotid duplex. This test is an ultrasound of the carotid arteries in your neck. It looks at blood flow through these arteries that supply the brain with blood. Allow one hour for this exam. There are no restrictions or special instructions.   

## 2012-11-14 NOTE — Assessment & Plan Note (Signed)
Cholesterol is at goal.  Continue current dose of statin and diet Rx.  No myalgias or side effects.  F/U  LFT's in 6 months. Lab Results  Component Value Date   LDLCALC 71 11/02/2012             

## 2012-11-14 NOTE — Assessment & Plan Note (Signed)
Pre CABG dopplers ok F/U duplex ASA

## 2013-01-14 ENCOUNTER — Telehealth: Payer: Self-pay | Admitting: Internal Medicine

## 2013-01-14 MED ORDER — AZITHROMYCIN 250 MG PO TABS: ORAL_TABLET | ORAL | Status: DC

## 2013-01-14 NOTE — Telephone Encounter (Signed)
LMTC x 1  

## 2013-01-14 NOTE — Telephone Encounter (Signed)
If sick < 72 h ok to do tamiflu, if > 72 h zpak, f/u by end of week if not better

## 2013-01-14 NOTE — Telephone Encounter (Signed)
Called and spoke with pt and he is aware of MW recs.  Pt stated that his symptoms started Friday afternoon.  He was in between the window for both meds so the pt decided that he would like the zpak to take.  This has been sent to the pts pharmacy and pt is aware to call back for appt if he is not feeling better by the end of the week.

## 2013-01-14 NOTE — Telephone Encounter (Signed)
Ptc/o fever (100.1), chills, sweats, sorethroat, nasal congestion, dry cough, back hurts, chest hurts to cough, ears ache.  MW and TP schedule full.  Please advise

## 2013-01-18 ENCOUNTER — Encounter: Payer: Self-pay | Admitting: Internal Medicine

## 2013-01-18 ENCOUNTER — Ambulatory Visit (INDEPENDENT_AMBULATORY_CARE_PROVIDER_SITE_OTHER)
Admission: RE | Admit: 2013-01-18 | Discharge: 2013-01-18 | Disposition: A | Discharge status: Home / Self Care | Payer: BC Managed Care – PPO | Source: Ambulatory Visit | Attending: Internal Medicine | Admitting: Internal Medicine

## 2013-01-18 ENCOUNTER — Ambulatory Visit (INDEPENDENT_AMBULATORY_CARE_PROVIDER_SITE_OTHER): Payer: BC Managed Care – PPO | Admitting: Internal Medicine

## 2013-01-18 VITALS — BP 118/64 | HR 63 | Temp 98.0°F | Ht 69.0 in | Wt 190.0 lb

## 2013-01-18 DIAGNOSIS — J069 Acute upper respiratory infection, unspecified: Secondary | ICD-10-CM | POA: Insufficient documentation

## 2013-01-18 DIAGNOSIS — I10 Essential (primary) hypertension: Secondary | ICD-10-CM

## 2013-01-18 MED ORDER — LEVOFLOXACIN 750 MG PO TABS: 750.0000 mg | ORAL_TABLET | Freq: Every day | ORAL | Status: DC

## 2013-01-18 MED ORDER — TRAMADOL HCL 50 MG PO TABS: ORAL_TABLET | ORAL | Status: DC

## 2013-01-18 NOTE — Progress Notes (Signed)
Patient ID: [name] [name], male   DOB: March 02, 1955   MRN: [id number] Brief patient profile:  56yobm quit smoking in early 1980's with hypertension hyperlipidemia and atypical chest pain with a left heart catheter showing 60% obstruction of the right-sided branches in the 1999 and a negative stress study in May of 2007> cabg required 10/2010    08/12/2010 ov/[name]  Cc new onset cp comes and goes x 1 week so went to ER July 30th with neg w/u and much less cp since er.  Location = Left of sternum only, aching but   No relation to activity, lasts no more than a few seconds, not noticeable supine or sleeping.  Does ok with exercise with no pains. rec Cardiac eval    DATE OF ADMISSION: 10/18/2010  DATE OF DISCHARGE: 10/24/2010  DISCHARGE SUMMARY  PRIMARY ADMITTING DIAGNOSIS: Chest pain.  ADDITIONAL/DISCHARGE DIAGNOSES:  1. Coronary artery disease.  2. New-onset angina.  3. Hypertension.  4. Hyperlipidemia.  5. Remote history of tobacco abuse.  6. Postoperative acute blood loss anemia.  PROCEDURES PERFORMED:  1. Cardiac catheterization.  2. Coronary artery bypass grafting x4 (left internal mammary artery to  the LAD, saphenous vein graft to the second diagonal, saphenous  vein graft to the first obtuse marginal, saphenous vein graft to  the posterior descending).  3. Endoscopic vein harvest, right leg  04/04/2011 f/u ov/[name] cc new L lat localized hip pain worse standing up, 3-4 weeks comes and goes, not tried nsaids, maybe worse toward end of day rec Try advil up to 3 with meals to see if helps hip pain and if not better within a week call for referral to an orthopedic surgeon> referred   01/02/2012 f/u ov/[name] f/u hbp, hyperlipidemia complicated by CAD s/p cabg 10/19/10. No cp, tia or claudication symptoms >>no changed   02/09/2012 Acute OV  Complains of cough occasionally producing light green mucus, head congestion w/ same colored mucus, some PND x1week - denies wheezing, SOB,  tightness, f/c/s.  Delysm is not helping .  He denies any hemoptysis, orthopnea, PND, or leg swelling. Has had no recent travel, antibiotic use. Complains over the last 24 hours. Cough has worsened with thick mucus >>zpack   04/04/2012 Acute OV  Complains of sharp pain in left inner thigh to groin x2 weeks.  Hurts to bend leg, cross leg . Sore to touch . No known injury. No back pain . No testicle pain . No urinary symptoms.  Area feels sore along the left inner upper thigh and inner groin.  No fever, chest pain, urinary symptoms, syncope .  Has been using advil 200mg  intermittently .  rec Stop Advil  May use Mobic 15mg  daily w/ food for 1 week then As needed  For joint pain  Tramadol As needed  Pain  Alternate Ice and Heat to area   04/25/2012 f/u ov/[name] comprehensive eval hbp, hyperlipidemia, djd L hip Chief Complaint  Patient presents with  . Annual Exam    Pt states left hip pain no better since last visit. Otherwise doing well.    some better with meloxicam, worse when internally rotating and certain exercises s radicular symptoms rec Call Dr [name] for follow up of your hip pain but ok to continue the mobic in meantime as needed  11/02/2012 f/u ov/[name] re: hbp/ hyperlipidemia/ djd on advil  2-3 x daily  Chief Complaint  Patient presents with  . Follow-up    Pt c/o "hot flashes" on and off for the  past 2-3 months.     Fleeting, only happened a few times when got too hot, like after a shower, s nausea, cp or abd pain rec No change rx   01/18/2013 acute  ov/[name] re: fever Chief Complaint  Patient presents with  . Acute Visit    Pt called with flu like symptoms 01/14/13-started zithromax with 1 dose remaining now.  He states had fever of 103 last night and chills. Has occ cough with minimal yellow sputum.   onset  01/11/13 and started zpak on the 5th sore throat is better, no ha, no aches, some nausea no vomiting, still a lot of nasal congestion.    No obvious day to day or  daytime variabilty or assoc chronic cough  or chest tightness, subjective wheeze overt sinus or hb symptoms. No unusual exp hx or h/o childhood pna/ asthma or knowledge of premature birth.  Sleeping ok without nocturnal  or early am exacerbation  of respiratory  c/o's or need for noct saba. Also denies any obvious fluctuation of symptoms with weather or environmental changes or other aggravating or alleviating factors except as outlined above   Current Medications, Allergies, Complete Past Medical History, Past Surgical History, Family History, and Social History were reviewed in [employer] record.  ROS  The following are not active complaints unless bolded sore throat, dysphagia, dental problems, itching, sneezing,  nasal congestion or excess/ purulent secretions, ear ache,   fever, chills, sweats, unintended wt loss, pleuritic or exertional cp, hemoptysis,  orthopnea pnd or leg swelling, presyncope, palpitations, heartburn, abdominal pain, anorexia, nausea, vomiting, diarrhea  or change in bowel or urinary habits, change in stools or urine, dysuria,hematuria,  rash, arthralgias, visual complaints, headache, numbness weakness or ataxia or problems with walking or coordination,  change in mood/affect or memory.              Past Medical History from [facility]: HYPERTENSION, BENIGN ESSENTIAL, CONTROLLED (ICD-401.1)  OBESITY  - Target wt = 190 for BMI < 30  Colon polyps....................................................................................................[name]  - colonoscopy 4/04, letter sent 05/15/07 reviewed with pt July 17, 2008 and August 03, 2009 > referred back to GI  CHEST PAIN (ICD-786.50)  - s/p cabg 10/19/2010 HYPERLIPIDEMIA  - target LDL less than 70 based on documented ischemic heart disease  HEALTH MAINTENANCE..........................................................................................[name]  - TD April 2009  - CPX  04/25/2012    Family History:  hypertension in mother and father no premature heart disease in any direct relatives nor stroke diabetes or cancer, except that his mother has developed leukemia  1 brother healthy   Social History:  quit smoking about 1983 denies alcohol use. 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  wt 201 November 20, 2007 > 205 April 18, 2008 > 184 01/03/2011 > 182 04/04/2011 > 07/04/2011  185 >189 09/19/2011 > 01/02/2012 194 >204 02/09/2012 >196 04/04/2012 > 04/25/2012 194 > 11/02/2012  193 > 01/19/2013  190  GEN: A/Ox3; pleasant , NAD  HEENT:  [place]/AT,  EACs-clear, TMs-wnl, NOSE-clear drainage  THROAT-clear, no lesions, no postnasal drip or exudate noted.   NECK:  Supple w/ fair ROM; no JVD; normal carotid impulses w/o bruits; no thyromegaly or nodules palpated; no lymphadenopathy.  RESP  Clear  P & A; w/o, wheezes/ rales/ or rhonchi.no accessory  muscle use, no dullness to percussion  CARD:  RRR, no m/r/g  , no peripheral edema, pulses intact, no cyanosis or clubbing.  GI:   Soft & nt; nml bowel sounds; no organomegaly or masses detected.    Musco: Warm bil, no deformities or joint swelling noted.     .  Neuro: alert, no focal deficits noted.  No path reflexes  Skin: Warm, no lesions or rashes     CXR  01/18/2013 :   No active cardiopulmonary disease.      Assessment:

## 2013-01-18 NOTE — Patient Instructions (Addendum)
Please remember to go to the x-ray department downstairs for your tests - we will call you with the results when they are available.  Take delsym two tsp every 12 hours and supplement if needed with  tramadol 50 mg up to 2 every 4 hours to suppress the urge to cough. Swallowing water or using ice chips/non mint and menthol containing candies (such as lifesavers or sugarless jolly ranchers) are also effective.  You should rest your voice and avoid activities that you know make you cough.  Once you have eliminated the cough for 3 straight days try reducing the tramadol first,  then the delsym as tolerated.    If condition worsens or I call you with any changes on cxr, activate the levaquin 750 x 5 days

## 2013-01-18 NOTE — Progress Notes (Signed)
Quick Note:  Spoke with pt and notified of results per Dr. [name]. Pt verbalized understanding and denied any questions.  ______ 

## 2013-01-19 NOTE — Assessment & Plan Note (Signed)
Reviewed natural hx of viral uri's which this fits except for fever on day 5 being the highest of this illness but nothing that looks like pna by hx or exam> finish zpak and add course of levaquin only for worsening clinically

## 2013-01-19 NOTE — Assessment & Plan Note (Signed)
Adequate control on present rx, reviewed > no change in rx needed   

## 2013-02-10 ENCOUNTER — Other Ambulatory Visit: Payer: Self-pay | Admitting: Internal Medicine

## 2013-02-11 NOTE — Telephone Encounter (Signed)
Are you okay with refilling this for him again Last filled on 01/18/13  No pending ov, thanks

## 2013-02-11 NOTE — Telephone Encounter (Signed)
Rx was printed and faxed to [facility] [address]

## 2013-02-19 ENCOUNTER — Telehealth: Payer: Self-pay | Admitting: Cardiovascular Disease

## 2013-02-19 NOTE — Telephone Encounter (Signed)
Pt called to see if he needs to do something or needs to be concern because he is having a [place] teeth pulled out later this week. Pt states the dentist recommended not to take the aspirin the morning of the procedure. Pt is aware that usually the dentist called to asked if pt can hold the aspirin longer than the day of the procedure. If the dentist  required to hold the aspirin  that morning; he is to  expect to bleed a litter more then usual. Pt verbalized understanding.

## 2013-02-19 NOTE — Telephone Encounter (Signed)
New message   upcoming dental appt . Has some questions regarding pre-med

## 2013-04-16 ENCOUNTER — Other Ambulatory Visit: Payer: Self-pay | Admitting: Internal Medicine

## 2013-04-18 ENCOUNTER — Telehealth: Payer: Self-pay | Admitting: Internal Medicine

## 2013-04-18 MED ORDER — AZITHROMYCIN 250 MG PO TABS: 250.0000 mg | ORAL_TABLET | ORAL | Status: DC

## 2013-04-18 NOTE — Telephone Encounter (Signed)
lmomtcb x1 for pt 

## 2013-04-18 NOTE — Telephone Encounter (Signed)
Pt returning call.[name] [name] ° °

## 2013-04-18 NOTE — Telephone Encounter (Signed)
Spoke with the pt  He states that the traveled to [facility] last wk and when returned home, started to have cough and hot flashes  He states also felt very congested  Started taking cloricidin and delsym and feels some better Still bothered by the cough- prod with minimal light green sputum  No appts today or tomorrow  Please advise, thanks! Allergies  Allergen Reactions  . Codeine     REACTION: migraines   Current Outpatient Prescriptions on File Prior to Visit  Medication Sig Dispense Refill  . acetaminophen (TYLENOL) 500 MG tablet Take 1,000 mg by mouth every 6 (six) hours as needed. pain      . aspirin 325 MG EC tablet Take 325 mg by mouth daily.        Marland Kitchen azithromycin (ZITHROMAX) 250 MG tablet Take as directed  6 tablet  0  . chlorpheniramine (CHLOR-TRIMETON) 4 MG tablet Take 4 mg by mouth 2 (two) times daily as needed.      . clotrimazole-betamethasone (LOTRISONE) cream Apply topically 2 (two) times daily as needed.        Marland Kitchen levofloxacin (LEVAQUIN) 750 MG tablet Take 1 tablet (750 mg total) by mouth daily. One daily stop if develop aching in joints/ muscles  5 tablet  0  . losartan (COZAAR) 100 MG tablet TAKE 1 TABLET EVERY DAY  30 tablet  11  . meloxicam (MOBIC) 15 MG tablet Take 1 tablet (15 mg total) by mouth daily as needed for pain.  30 tablet  3  . metoprolol tartrate (LOPRESSOR) 25 MG tablet TAKE 1 TABLET (25 MG TOTAL) BY MOUTH 2 (TWO) TIMES DAILY.  60 tablet  11  . nitroGLYCERIN (NITROSTAT) 0.4 MG SL tablet Place 1 tablet (0.4 mg total) under the tongue every 5 (five) minutes as needed for chest pain.  25 tablet  4  . Polyethylene Glycol 3350 (MIRALAX PO) Take 17 g/day by mouth daily as needed. constipation      . simvastatin (ZOCOR) 40 MG tablet TAKE 1 TABLET AT BEDTIME  30 tablet  11  . sodium chloride (OCEAN) 0.65 % nasal spray Place 2 sprays into the nose every 6 (six) hours as needed. Nasal congestion      . traMADol (ULTRAM) 50 MG tablet TAKE 1 TO 2 TABLETS BY MOUTH EVERY 4  HOURS AS NEEDED FOR COUGH OR PAIN  40 tablet  0   No current facility-administered medications on file prior to visit.

## 2013-04-18 NOTE — Telephone Encounter (Signed)
Spoke with the pt and notified of recs per CDY  He verbalized understanding  Rx for zpack sent to pharm

## 2013-04-18 NOTE — Telephone Encounter (Signed)
Offer a Z pak

## 2013-08-05 ENCOUNTER — Ambulatory Visit (INDEPENDENT_AMBULATORY_CARE_PROVIDER_SITE_OTHER): Payer: BC Managed Care – PPO | Admitting: Sports Medicine

## 2013-08-05 ENCOUNTER — Ambulatory Visit
Admission: RE | Admit: 2013-08-05 | Discharge: 2013-08-05 | Disposition: A | Discharge status: Home / Self Care | Payer: BC Managed Care – PPO | Source: Ambulatory Visit | Attending: Sports Medicine | Admitting: Sports Medicine

## 2013-08-05 ENCOUNTER — Encounter: Payer: Self-pay | Admitting: Sports Medicine

## 2013-08-05 VITALS — BP 155/98 | Ht 68.0 in | Wt 190.0 lb

## 2013-08-05 DIAGNOSIS — M25552 Pain in left hip: Secondary | ICD-10-CM

## 2013-08-05 DIAGNOSIS — M25559 Pain in unspecified hip: Secondary | ICD-10-CM

## 2013-08-05 NOTE — Progress Notes (Signed)
   Subjective:    Patient ID: [name] [name], male    DOB: [birth year], 58 y.o.   MRN: [id number]  HPI chief complaint: Left hip pain  Very pleasant 58 year old male comes in today complaining of 7-8 months of left hip pain. No trauma that he can recall but gradual onset of pain that is mainly present along the lateral hip but at times were radiate into his groin and down the left thigh. His pain is most noticeable when walking on uneven surfaces. He also notes some difficulty getting dressed in the morning due to some limited hip mobility. No low back pain. No associated numbness or tingling. No pain at night. No prior hip surgeries.  Past medical history and current medications reviewed. Medical history significant for coronary artery disease. He is allergic to codeine Former smoker    Review of Systems     Objective:   Physical Exam Well-developed, well-nourished. No acute distress. Awake alert and oriented x3.  Left hip: Patient has good active and passive range of motion but does have reproducible lateral hip pain with internal rotation. Minimal groin pain with this. There is reproducible pain with resisted hip flexion however. No tenderness to palpation over the greater trochanteric bursa. Negative straight leg raise. No gross neurological deficits of either lower extremity. Patient walks with a very slight limp in the left foot slightly externally rotated.       Assessment & Plan:  Left hip pain likely secondary to DJD  AP of the pelvis and a lateral left hip x-rays to evaluate degree of osteoarthritis present. I will call the patient after I review those x-rays to discuss treatment options.

## 2013-08-06 ENCOUNTER — Telehealth: Payer: Self-pay | Admitting: Sports Medicine

## 2013-08-06 NOTE — Telephone Encounter (Signed)
I spoke with the patient on the phone today regarding x-rays of his left hip. He has moderately advanced DJD in his left hip. I discussed treatment options with him including oral anti-inflammatories, Tylenol, and intra-articular hip injections. I recommended against oral anti-inflammatories given his cardiac history. I've instead recommended that he try some extra strength Tylenol twice daily as needed for pain. If symptoms warrant we can send him for an intra-articular cortisone injection at a later date. Definitive treatment is a total hip arthroplasty but I think we should try some conservative treatment first.

## 2013-08-09 ENCOUNTER — Ambulatory Visit: Payer: BC Managed Care – PPO | Admitting: Sports Medicine

## 2013-09-15 ENCOUNTER — Other Ambulatory Visit: Payer: Self-pay | Admitting: Internal Medicine

## 2014-02-10 NOTE — Progress Notes (Signed)
Patient ID: [name] [name], male   DOB: 1955-09-06, 59 y.o.   MRN: [id number]   59 y.o. seen for f/u CAD  S/P CABG 10/19/10 [name] [name] F/U CAD  SURGICAL PROCEDURE: Coronary artery bypass grafting x4 with the left  internal mammary to the left anterior descending coronary artery,  reverse saphenous vein graft to the second diagonal coronary artery,  reverse saphenous vein graft to the first obtuse marginal, reverse  saphenous vein graft to the posterior descending coronary artery with  right leg endovein harvesting.    11/14/12  Duplex with 1-39% bilateral carotid disease with mild plaque  Left back/hip arthritis slowing him down BP is up Compliant with meds Needs new nitro   ROS: Denies fever, malais, weight loss, blurry vision, decreased visual acuity, cough, sputum, SOB, hemoptysis, pleuritic pain, palpitaitons, heartburn, abdominal pain, melena, lower extremity edema, claudication, or rash.  All other systems reviewed and negative  General: Affect appropriate Healthy:  appears stated age 79: normal Neck supple with no adenopathy JVP normal left  bruits no thyromegaly Lungs clear with no wheezing and good diaphragmatic motion Heart:  S1/S2 no murmur, no rub, gallop or click PMI normal Abdomen: benighn, BS positve, no tenderness, no AAA no bruit.  No HSM or HJR Distal pulses intact with no bruits No edema Neuro non-focal Skin warm and dry No muscular weakness   Current Outpatient Prescriptions  Medication Sig Dispense Refill  . aspirin 325 MG EC tablet Take 325 mg by mouth daily.      Marland Kitchen losartan (COZAAR) 100 MG tablet TAKE 1 TABLET EVERY DAY 30 tablet 11  . metoprolol tartrate (LOPRESSOR) 25 MG tablet TAKE 1 TABLET (25 MG TOTAL) BY MOUTH 2 (TWO) TIMES DAILY. 60 tablet 11  . nitroGLYCERIN (NITROSTAT) 0.4 MG SL tablet Place 1 tablet (0.4 mg total) under the tongue every 5 (five) minutes as needed for chest pain. 25 tablet 4  . simvastatin (ZOCOR) 40 MG tablet TAKE 1 TABLET BY  MOUTH AT BEDTIME 30 tablet 11   No current facility-administered medications for this visit.    Allergies  Codeine  Electrocardiogram:  01/02/12  SR rate 56 nonspecific inferolateral T wave changes  11/14/12 SR rate 54 RSR' same nonspecific ST changes 02/10/14  SR rate 67 nonspecific ST/T wave changes   Assessment and Plan

## 2014-02-11 ENCOUNTER — Encounter: Payer: Self-pay | Admitting: Cardiovascular Disease

## 2014-02-11 ENCOUNTER — Ambulatory Visit (INDEPENDENT_AMBULATORY_CARE_PROVIDER_SITE_OTHER): Payer: BLUE CROSS/BLUE SHIELD | Admitting: Cardiovascular Disease

## 2014-02-11 VITALS — BP 156/80 | HR 67 | Ht 68.0 in | Wt 202.1 lb

## 2014-02-11 DIAGNOSIS — Z79899 Other long term (current) drug therapy: Secondary | ICD-10-CM

## 2014-02-11 DIAGNOSIS — I10 Essential (primary) hypertension: Secondary | ICD-10-CM

## 2014-02-11 DIAGNOSIS — R0989 Other specified symptoms and signs involving the circulatory and respiratory systems: Secondary | ICD-10-CM

## 2014-02-11 MED ORDER — LOSARTAN POTASSIUM-HCTZ 100-25 MG PO TABS: 1.0000 | ORAL_TABLET | Freq: Every day | ORAL | Status: DC

## 2014-02-11 MED ORDER — NITROGLYCERIN 0.4 MG SL SUBL: 0.4000 mg | SUBLINGUAL_TABLET | SUBLINGUAL | Status: DC | PRN

## 2014-02-11 NOTE — Assessment & Plan Note (Signed)
Ad diuretic f/u BMET 2 weeks  F/u with me next available

## 2014-02-11 NOTE — Assessment & Plan Note (Signed)
4 years post CABG  No chest pain ECG ok  Refill for nitro called in

## 2014-02-11 NOTE — Patient Instructions (Addendum)
Your physician recommends that you schedule a follow-up appointment in:  [place] has recommended you make the following change in your medication:  STOP  LOSARTAN  START LOSARTAN /  HCT  100 /25 MG   EVERY DAY   Your physician has requested that you have a carotid duplex. This test is an ultrasound of the carotid arteries in your neck. It looks at blood flow through these arteries that supply the brain with blood. Allow one hour for this exam. There are no restrictions or special instructions.  IN  2 WEEKS    Your physician recommends that you return for lab work in:  [place]

## 2014-02-11 NOTE — Assessment & Plan Note (Signed)
Cholesterol is at goal.  Continue current dose of statin and diet Rx.  No myalgias or side effects.  F/U  LFT's in 6 months. Lab Results  Component Value Date   LDLCALC 71 11/02/2012

## 2014-02-11 NOTE — Assessment & Plan Note (Signed)
F/U duplex not checked since 2014 had plaque and no stenosis then

## 2014-02-26 ENCOUNTER — Other Ambulatory Visit (INDEPENDENT_AMBULATORY_CARE_PROVIDER_SITE_OTHER): Payer: BLUE CROSS/BLUE SHIELD | Admitting: *Deleted

## 2014-02-26 ENCOUNTER — Ambulatory Visit (HOSPITAL_COMMUNITY): Payer: BLUE CROSS/BLUE SHIELD | Attending: Cardiology | Admitting: Cardiology

## 2014-02-26 DIAGNOSIS — Z79899 Other long term (current) drug therapy: Secondary | ICD-10-CM

## 2014-02-26 DIAGNOSIS — I6523 Occlusion and stenosis of bilateral carotid arteries: Secondary | ICD-10-CM

## 2014-02-26 DIAGNOSIS — R0989 Other specified symptoms and signs involving the circulatory and respiratory systems: Secondary | ICD-10-CM

## 2014-02-26 LAB — BASIC METABOLIC PANEL
BUN: 23 mg/dL (ref 6–23)
CO2: 30 mEq/L (ref 19–32)
Calcium: 9.6 mg/dL (ref 8.4–10.5)
Chloride: 100 mEq/L (ref 96–112)
Creatinine, Ser: 0.99 mg/dL (ref 0.40–1.50)
GFR: 99.32 mL/min (ref >60.00)
Glucose, Bld: 111 mg/dL — ABNORMAL HIGH (ref 70–99)
Potassium: 4.1 mEq/L (ref 3.5–5.1)
Sodium: 138 mEq/L (ref 135–145)

## 2014-02-26 NOTE — Progress Notes (Signed)
Carotid duplex performed 

## 2014-03-05 ENCOUNTER — Telehealth: Payer: Self-pay | Admitting: Cardiovascular Disease

## 2014-03-05 NOTE — Telephone Encounter (Signed)
New Msg         Pt returning call.     Please contact him on [phone].

## 2014-03-06 NOTE — Telephone Encounter (Signed)
SPOKE  WITH PT  YESTERDAY  RE  LAB  RESULTS  .[name] [name]

## 2014-03-10 NOTE — Progress Notes (Signed)
Patient ID: [name] [name], male   DOB: Nov 07, 1955, 59 y.o.   MRN: [id number]   59 y.o. seen for f/u CAD  S/P CABG 10/19/10 Servando Snare F/U CAD  SURGICAL PROCEDURE: Coronary artery bypass grafting x4 with the left  internal mammary to the left anterior descending coronary artery,  reverse saphenous vein graft to the second diagonal coronary artery,  reverse saphenous vein graft to the first obtuse marginal, reverse  saphenous vein graft to the posterior descending coronary artery with  right leg endovein harvesting.    Duplex 02/27/14  Plaque no stenosis f/u again 02/2016     Left back/hip arthritis slowing him down BP is up Compliant with meds Needs new nitro Diuretic added last visit and BP improved Reviewed Home readings and much better  Has two new lab pups since November.  Discussed walking them and using recumbent bicycle Has pain in left hip and told him glucosamine ok   ROS: Denies fever, malais, weight loss, blurry vision, decreased visual acuity, cough, sputum, SOB, hemoptysis, pleuritic pain, palpitaitons, heartburn, abdominal pain, melena, lower extremity edema, claudication, or rash.  All other systems reviewed and negative  General: Affect appropriate Healthy:  appears stated age 59: normal Neck supple with no adenopathy JVP normal left  bruits no thyromegaly Lungs clear with no wheezing and good diaphragmatic motion Heart:  S1/S2 no murmur, no rub, gallop or click PMI normal Abdomen: benighn, BS positve, no tenderness, no AAA no bruit.  No HSM or HJR Distal pulses intact with no bruits No edema Neuro non-focal Skin warm and dry No muscular weakness   Current Outpatient Prescriptions  Medication Sig Dispense Refill  . aspirin 325 MG EC tablet Take 325 mg by mouth daily.      Marland Kitchen losartan-hydrochlorothiazide (HYZAAR) 100-25 MG per tablet Take 1 tablet by mouth daily. 30 tablet 11  . metoprolol tartrate (LOPRESSOR) 25 MG tablet TAKE 1 TABLET (25 MG TOTAL) BY MOUTH  2 (TWO) TIMES DAILY. 60 tablet 11  . nitroGLYCERIN (NITROSTAT) 0.4 MG SL tablet Place 1 tablet (0.4 mg total) under the tongue every 5 (five) minutes as needed for chest pain. 25 tablet 4  . simvastatin (ZOCOR) 40 MG tablet TAKE 1 TABLET BY MOUTH AT BEDTIME 30 tablet 11   No current facility-administered medications for this visit.    Allergies  Codeine  Electrocardiogram:  01/02/12  SR rate 56 nonspecific inferolateral T wave changes  11/14/12 SR rate 54 RSR' same nonspecific ST changes 02/10/14  SR rate 67 nonspecific ST/T wave changes   Assessment and Plan

## 2014-03-11 ENCOUNTER — Ambulatory Visit (INDEPENDENT_AMBULATORY_CARE_PROVIDER_SITE_OTHER): Payer: BLUE CROSS/BLUE SHIELD | Admitting: Cardiovascular Disease

## 2014-03-11 ENCOUNTER — Encounter: Payer: Self-pay | Admitting: Cardiovascular Disease

## 2014-03-11 VITALS — BP 110/68 | HR 61 | Ht 68.0 in | Wt 199.8 lb

## 2014-03-11 DIAGNOSIS — I10 Essential (primary) hypertension: Secondary | ICD-10-CM

## 2014-03-11 DIAGNOSIS — Z951 Presence of aortocoronary bypass graft: Secondary | ICD-10-CM

## 2014-03-11 DIAGNOSIS — M25552 Pain in left hip: Secondary | ICD-10-CM

## 2014-03-11 DIAGNOSIS — R0989 Other specified symptoms and signs involving the circulatory and respiratory systems: Secondary | ICD-10-CM

## 2014-03-11 NOTE — Assessment & Plan Note (Signed)
Avoid excess NSAI for pain with HTN.  F/U ortho  PT/OT  Glucosamine ok

## 2014-03-11 NOTE — Assessment & Plan Note (Signed)
Duplex with plaque no stenosis ASA  F/u duplex in 2 years Reviewed

## 2014-03-11 NOTE — Patient Instructions (Signed)
Your physician recommends that you schedule a follow-up appointment in:  3 MONTHS WITH  DR NISHAN  Your physician recommends that you continue on your current medications as directed. Please refer to the Current Medication list given to you today.  

## 2014-03-11 NOTE — Assessment & Plan Note (Signed)
Well controlled.  Continue current medications and low sodium Dash type diet.   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

## 2014-03-11 NOTE — Assessment & Plan Note (Signed)
CABG 2012  No angina  contnue ASA and current meds Has nitro

## 2014-04-15 ENCOUNTER — Other Ambulatory Visit: Payer: Self-pay | Admitting: Internal Medicine

## 2014-05-17 ENCOUNTER — Other Ambulatory Visit: Payer: Self-pay | Admitting: Internal Medicine

## 2014-05-23 ENCOUNTER — Ambulatory Visit (INDEPENDENT_AMBULATORY_CARE_PROVIDER_SITE_OTHER): Payer: 59 | Admitting: Internal Medicine

## 2014-05-23 ENCOUNTER — Encounter: Payer: Self-pay | Admitting: Internal Medicine

## 2014-05-23 ENCOUNTER — Other Ambulatory Visit (INDEPENDENT_AMBULATORY_CARE_PROVIDER_SITE_OTHER): Payer: 59

## 2014-05-23 ENCOUNTER — Ambulatory Visit (INDEPENDENT_AMBULATORY_CARE_PROVIDER_SITE_OTHER)
Admission: RE | Admit: 2014-05-23 | Discharge: 2014-05-23 | Disposition: A | Discharge status: Home / Self Care | Payer: 59 | Source: Ambulatory Visit | Attending: Internal Medicine | Admitting: Internal Medicine

## 2014-05-23 VITALS — BP 130/78 | HR 60 | Ht 68.0 in | Wt 192.8 lb

## 2014-05-23 DIAGNOSIS — I10 Essential (primary) hypertension: Secondary | ICD-10-CM | POA: Diagnosis not present

## 2014-05-23 DIAGNOSIS — R312 Other microscopic hematuria: Secondary | ICD-10-CM

## 2014-05-23 DIAGNOSIS — Z0000 Encounter for general adult medical examination without abnormal findings: Secondary | ICD-10-CM

## 2014-05-23 DIAGNOSIS — R3129 Other microscopic hematuria: Secondary | ICD-10-CM

## 2014-05-23 DIAGNOSIS — I259 Chronic ischemic heart disease, unspecified: Secondary | ICD-10-CM | POA: Diagnosis not present

## 2014-05-23 DIAGNOSIS — E7800 Pure hypercholesterolemia, unspecified: Secondary | ICD-10-CM

## 2014-05-23 LAB — CBC WITH DIFFERENTIAL/PLATELET
Basophils Absolute: 0 10*3/uL (ref 0.0–0.1)
Basophils Relative: 0.7 % (ref 0.0–3.0)
Eosinophils Absolute: 0.1 10*3/uL (ref 0.0–0.7)
Eosinophils Relative: 1 % (ref 0.0–5.0)
HCT: 38.6 % — ABNORMAL LOW (ref 39.0–52.0)
Hemoglobin: 13.2 g/dL (ref 13.0–17.0)
Lymphocytes Relative: 32.7 % (ref 12.0–46.0)
Lymphs Abs: 1.8 10*3/uL (ref 0.7–4.0)
MCHC: 34.1 g/dL (ref 30.0–36.0)
MCV: 90.1 fl (ref 78.0–100.0)
Monocytes Absolute: 0.4 10*3/uL (ref 0.1–1.0)
Monocytes Relative: 7.2 % (ref 3.0–12.0)
Neutro Abs: 3.2 10*3/uL (ref 1.4–7.7)
Neutrophils Relative %: 58.4 % (ref 43.0–77.0)
Platelets: 250 10*3/uL (ref 150.0–400.0)
RBC: 4.29 Mil/uL (ref 4.22–5.81)
RDW: 13.5 % (ref 11.5–15.5)
WBC: 5.5 10*3/uL (ref 4.0–10.5)

## 2014-05-23 LAB — URINALYSIS, ROUTINE W REFLEX MICROSCOPIC
Bilirubin Urine: NEGATIVE
Ketones, ur: NEGATIVE
Leukocytes, UA: NEGATIVE
Nitrite: NEGATIVE
Specific Gravity, Urine: 1.025 (ref 1.000–1.030)
Total Protein, Urine: NEGATIVE
Urine Glucose: NEGATIVE
Urobilinogen, UA: 0.2 (ref 0.0–1.0)
pH: 6 (ref 5.0–8.0)

## 2014-05-23 LAB — TSH: TSH: 0.58 u[IU]/mL (ref 0.35–4.50)

## 2014-05-23 LAB — BASIC METABOLIC PANEL
BUN: 22 mg/dL (ref 6–23)
CO2: 30 mEq/L (ref 19–32)
Calcium: 9.9 mg/dL (ref 8.4–10.5)
Chloride: 99 mEq/L (ref 96–112)
Creatinine, Ser: 1.02 mg/dL (ref 0.40–1.50)
GFR: 95.88 mL/min (ref >60.00)
Glucose, Bld: 111 mg/dL — ABNORMAL HIGH (ref 70–99)
Potassium: 3.7 mEq/L (ref 3.5–5.1)
Sodium: 136 mEq/L (ref 135–145)

## 2014-05-23 LAB — HEPATIC FUNCTION PANEL
ALT: 30 U/L (ref 0–53)
AST: 31 U/L (ref 0–37)
Albumin: 4.6 g/dL (ref 3.5–5.2)
Alkaline Phosphatase: 43 U/L (ref 39–117)
Bilirubin, Direct: 0.2 mg/dL (ref 0.0–0.3)
Total Bilirubin: 0.8 mg/dL (ref 0.2–1.2)
Total Protein: 7.6 g/dL (ref 6.0–8.3)

## 2014-05-23 LAB — LIPID PANEL
Cholesterol: 140 mg/dL (ref 0–200)
HDL: 37.2 mg/dL — ABNORMAL LOW (ref >39.00)
LDL Cholesterol: 90 mg/dL (ref 0–99)
NonHDL: 102.8
Total CHOL/HDL Ratio: 4
Triglycerides: 64 mg/dL (ref 0.0–149.0)
VLDL: 12.8 mg/dL (ref 0.0–40.0)

## 2014-05-23 LAB — TESTOSTERONE: Testosterone: 264.74 ng/dL — ABNORMAL LOW (ref 300.00–890.00)

## 2014-05-23 MED ORDER — METOPROLOL TARTRATE 25 MG PO TABS: ORAL_TABLET | ORAL | Status: DC

## 2014-05-23 NOTE — Progress Notes (Signed)
Patient ID: [name] [name], male   DOB: [birth year]   MRN: [id number]   Brief patient profile:  56yobm quit smoking in early 1980's with hypertension hyperlipidemia and atypical chest pain with a left heart catheter showing 60% obstruction of the right-sided branches in the 1999 and a negative stress study in May of 2007> cabg required 10/2010    08/12/2010 ov/[name] [name]  Cc new onset cp comes and goes x 1 week so went to ER July 30th with neg w/u and much less cp since er.  Location = Left of sternum only, aching but   No relation to activity, lasts no more than a few seconds, not noticeable supine or sleeping.  Does ok with exercise with no pains. rec Cardiac eval    DATE OF ADMISSION: 10/18/2010  DATE OF DISCHARGE: 10/24/2010  DISCHARGE SUMMARY  PRIMARY ADMITTING DIAGNOSIS: Chest pain.  ADDITIONAL/DISCHARGE DIAGNOSES:  1. Coronary artery disease.  2. New-onset angina.  3. Hypertension.  4. Hyperlipidemia.  5. Remote history of tobacco abuse.  6. Postoperative acute blood loss anemia.  PROCEDURES PERFORMED:  1. Cardiac catheterization.  2. Coronary artery bypass grafting x4 (left internal mammary artery to  the LAD, saphenous vein graft to the second diagonal, saphenous  vein graft to the first obtuse marginal, saphenous vein graft to  the posterior descending).  3. Endoscopic vein harvest, right leg  04/04/2011 f/u ov/[name] [name] cc new L lat localized hip pain worse standing up, 3-4 weeks comes and goes, not tried nsaids, maybe worse toward end of day rec Try advil up to 3 with meals to see if helps hip pain and if not better within a week call for referral to an orthopedic surgeon> referred   01/02/2012 f/u ov/[name] [name] f/u hbp, hyperlipidemia complicated by CAD s/p cabg 10/19/10. No cp, tia or claudication symptoms >>no changed   05/23/2014 f/u ov/Cathe [name] re: cpx  Chief Complaint  Patient presents with  . Follow-up    Pt states overall doing well.  No new co's today.     Not able to ex due  to L hip and no f/u with Dr [name] yet,  advil x 2 at hs  Some decrease libido No ex cp/ tia/ claudication symptoms / Not limited by breathing from desired activities    No   chronic cough or  chest tightness, subjective wheeze overt sinus or hb symptoms. No unusual exp hx or h/o childhood pna/ asthma or knowledge of premature birth.  Sleeping ok without nocturnal  or early am exacerbation  of respiratory  c/o's or need for noct saba. Also denies any obvious fluctuation of symptoms with weather or environmental changes or other aggravating or alleviating factors except as outlined above   Current Medications, Allergies, Complete Past Medical History, Past Surgical History, Family History, and Social History were reviewed in [employer] record.  ROS  The following are not active complaints unless bolded sore throat, dysphagia, dental problems, itching, sneezing,  nasal congestion or excess/ purulent secretions, ear ache,   fever, chills, sweats, unintended wt loss, pleuritic or exertional cp, hemoptysis,  orthopnea pnd or leg swelling, presyncope, palpitations, heartburn, abdominal pain, anorexia, nausea, vomiting, diarrhea  or change in bowel or urinary habits, change in stools or urine, dysuria,hematuria,  rash, arthralgias, visual complaints, headache, numbness weakness or ataxia or problems with walking or coordination,  change in mood/affect or memory.             Past Medical History from [facility]: HYPERTENSION, BENIGN ESSENTIAL,  CONTROLLED (ICD-401.1)  OBESITY  - Target wt = 190 for BMI < 30  Colon polyps....................................................................................................[name] [name]  - colonoscopy 4/04, letter sent 05/15/07 reviewed with pt July 17, 2008 and August 03, 2009 > referred back to GI  CHEST PAIN (ICD-786.50)  - s/p cabg 10/19/2010 HYPERLIPIDEMIA  - target LDL less than 70 based on documented ischemic heart disease  HEALTH  MAINTENANCE..........................................................................................[name] [name]  - TD April 2009  - CPX  05/23/2014      Family History:  hypertension in mother and father no premature heart disease in any direct relatives nor stroke diabetes or cancer, except that his mother has developed leukemia  1 brother healthy  Neg prostate ca   Social History:  quit smoking about 1983 denies alcohol use. 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   wt 201 November 20, 2007 > 205 April 18, 2008 > 184 01/03/2011 > 182 04/04/2011 > 07/04/2011  185 >189 09/19/2011 > 01/02/2012 194 >204 02/09/2012 >196 04/04/2012 > 04/25/2012 194 > 11/02/2012  193 > 01/19/2013  190 >  05/23/2014  193  GEN: A/Ox3; pleasant , NAD  HEENT:  [place]/AT,  EACs-clear, TMs-wnl, NOSE-clear drainage  THROAT-clear, no lesions, no postnasal drip or exudate noted.   NECK:  Supple w/ fair ROM; no JVD; normal carotid impulses w/o bruits; no thyromegaly or nodules palpated; no lymphadenopathy.  RESP  Clear  P & A; w/o, wheezes/ rales/ or rhonchi.no accessory muscle use, no dullness to percussion  CARD:  RRR, no m/r/g  , no peripheral edema, pulses intact, no cyanosis or clubbing.  GI:   Soft & nt; nml bowel sounds; no organomegaly or masses detected.    Musco: Warm bil, no deformities or joint swelling noted.     .  Neuro: alert, no focal deficits noted.  No path reflexes  Skin: Warm, no lesions or rashes  GU uncirc, nl testes, no IH  Rectal:  Very nl prostate, no nodules/ stool G neg    CXR PA and Lateral:   05/23/2014 :     I personally reviewed images and agree with radiology impression as follows:    No active cardiopulmonary disease.   Labs ordered/ reviewed:    Lab 05/23/14 1014  NA 136  K 3.7  CL 99  CO2 30  BUN 22  CREATININE 1.02  GLUCOSE 111*     Recent Labs Lab 05/23/14 1014  HGB 13.2  HCT 38.6*  WBC 5.5  PLT 250.0     Lab Results  Component Value Date   TSH 0.58 05/23/2014              Assessment:

## 2014-05-23 NOTE — Patient Instructions (Signed)
Please remember to go to the lab and x-ray department downstairs for your tests - we will call you with the results when they are available.  cpx each May and see Dr Johnsie Cancel each February, call sooner if needed

## 2014-05-25 ENCOUNTER — Encounter: Payer: Self-pay | Admitting: Internal Medicine

## 2014-05-25 DIAGNOSIS — R3129 Other microscopic hematuria: Secondary | ICD-10-CM | POA: Insufficient documentation

## 2014-05-25 NOTE — Assessment & Plan Note (Signed)
-   Target LDL < 70 since has known CAD  Lab Results  Component Value Date   CHOL 140 05/23/2014   HDL 37.20* 05/23/2014   LDLCALC 90 05/23/2014   TRIG 64.0 05/23/2014   CHOLHDL 4 05/23/2014     Adequate control on present rx, lfts reviewed > no change in rx needed

## 2014-05-25 NOTE — Assessment & Plan Note (Signed)
Adequate control on present rx, reviewed > no change in rx needed   

## 2014-05-25 NOTE — Assessment & Plan Note (Signed)
-   IHD with 60% obstruction 1999 RCA negative stress Cardiolite May 2007  - CABG 10/19/2010  F/u Dr Johnsie Cancel

## 2014-05-25 NOTE — Assessment & Plan Note (Signed)
Up to date

## 2014-05-25 NOTE — Assessment & Plan Note (Signed)
Referred to urology

## 2014-05-26 ENCOUNTER — Other Ambulatory Visit: Payer: Self-pay | Admitting: Internal Medicine

## 2014-05-26 DIAGNOSIS — R3129 Other microscopic hematuria: Secondary | ICD-10-CM

## 2014-05-26 NOTE — Progress Notes (Signed)
Quick Note:  Spoke with pt and notified of results per Dr. [name]. Pt verbalized understanding and denied any questions.  ______ 

## 2014-06-08 NOTE — Progress Notes (Signed)
Patient ID: [name] [name], male   DOB: 07-29-1955, 59 y.o.   MRN: [id number]   59 y.o. seen for f/u CAD  S/P CABG 10/19/10 Servando Snare F/U CAD  SURGICAL PROCEDURE: Coronary artery bypass grafting x4 with the left  internal mammary to the left anterior descending coronary artery,  reverse saphenous vein graft to the second diagonal coronary artery,  reverse saphenous vein graft to the first obtuse marginal, reverse  saphenous vein graft to the posterior descending coronary artery with  right leg endovein harvesting.    Duplex 02/27/14  Plaque no stenosis f/u again 02/2016     Left back/hip arthritis slowing him down BP is up Compliant with meds Needs new nitro Diuretic added last visit and BP improved Reviewed Home readings and much better  Has two new lab pups since November.  Discussed walking them and using recumbent bicycle Has pain in left hip and told him glucosamine ok   ROS: Denies fever, malais, weight loss, blurry vision, decreased visual acuity, cough, sputum, SOB, hemoptysis, pleuritic pain, palpitaitons, heartburn, abdominal pain, melena, lower extremity edema, claudication, or rash.  All other systems reviewed and negative  General: Affect appropriate Healthy:  appears stated age 59: normal Neck supple with no adenopathy JVP normal left  bruits no thyromegaly Lungs clear with no wheezing and good diaphragmatic motion Heart:  S1/S2 no murmur, no rub, gallop or click PMI normal Abdomen: benighn, BS positve, no tenderness, no AAA no bruit.  No HSM or HJR Distal pulses intact with no bruits No edema Neuro non-focal Skin warm and dry No muscular weakness   Current Outpatient Prescriptions  Medication Sig Dispense Refill  . aspirin 325 MG EC tablet Take 325 mg by mouth daily.      Marland Kitchen losartan-hydrochlorothiazide (HYZAAR) 100-25 MG per tablet Take 1 tablet by mouth daily. 30 tablet 11  . metoprolol tartrate (LOPRESSOR) 25 MG tablet TAKE 1 TABLET (25 MG TOTAL) BY MOUTH  2 (TWO) TIMES DAILY. 60 tablet 11  . nitroGLYCERIN (NITROSTAT) 0.4 MG SL tablet Place 1 tablet (0.4 mg total) under the tongue every 5 (five) minutes as needed for chest pain. 25 tablet 4  . simvastatin (ZOCOR) 40 MG tablet TAKE 1 TABLET BY MOUTH AT BEDTIME 30 tablet 11   No current facility-administered medications for this visit.    Allergies  Codeine  Electrocardiogram:  01/02/12  SR rate 56 nonspecific inferolateral T wave changes  11/14/12 SR rate 1 RSR' same nonspecific ST changes 02/10/14  SR rate 67 nonspecific ST/T wave changes   Assessment and Plan CAD:  Stable no angina CABG 2012  HTN:  Much improved with diuretic Hematuria has f/u appt with Dr Jeffie Pollock  PSA normal Chol:  Cholesterol is at goal.  Continue current dose of statin and diet Rx.  No myalgias or side effects.  F/U  LFT's in 6 months. Lab Results  Component Value Date   LDLCALC 90 05/23/2014   F/U with me 6 months  Jenkins Rouge

## 2014-06-11 ENCOUNTER — Encounter: Payer: Self-pay | Admitting: Cardiovascular Disease

## 2014-06-11 ENCOUNTER — Ambulatory Visit (INDEPENDENT_AMBULATORY_CARE_PROVIDER_SITE_OTHER): Payer: 59 | Admitting: Cardiovascular Disease

## 2014-06-11 VITALS — BP 118/80 | HR 57 | Ht 69.0 in | Wt 195.0 lb

## 2014-06-11 DIAGNOSIS — I2510 Atherosclerotic heart disease of native coronary artery without angina pectoris: Secondary | ICD-10-CM | POA: Diagnosis not present

## 2014-06-11 DIAGNOSIS — I2583 Coronary atherosclerosis due to lipid rich plaque: Principal | ICD-10-CM

## 2014-06-11 NOTE — Patient Instructions (Signed)
Medication Instructions:  NO CHANGES  Labwork: NONE  Testing/Procedures: NONE  Follow-Up: Your physician wants you to follow-up in: 6 MONTHS WITH DR NISHAN You will receive a reminder letter in the mail two months in advance. If you don't receive a letter, please call our office to schedule the follow-up appointment.  Any Other Special Instructions Will Be Listed Below (If Applicable).   

## 2014-06-17 ENCOUNTER — Other Ambulatory Visit: Payer: Self-pay | Admitting: Internal Medicine

## 2014-06-24 ENCOUNTER — Encounter: Payer: Self-pay | Admitting: Gastroenterology

## 2014-08-31 ENCOUNTER — Emergency Department (HOSPITAL_COMMUNITY)
Admission: EM | Admit: 2014-08-31 | Discharge: 2014-09-01 | Disposition: A | Discharge status: Home / Self Care | Payer: 59 | Attending: Emergency Medicine | Admitting: Emergency Medicine

## 2014-08-31 DIAGNOSIS — I2510 Atherosclerotic heart disease of native coronary artery without angina pectoris: Secondary | ICD-10-CM | POA: Diagnosis not present

## 2014-08-31 DIAGNOSIS — N201 Calculus of ureter: Secondary | ICD-10-CM

## 2014-08-31 DIAGNOSIS — I10 Essential (primary) hypertension: Secondary | ICD-10-CM | POA: Diagnosis not present

## 2014-08-31 DIAGNOSIS — Q6211 Congenital occlusion of ureteropelvic junction: Secondary | ICD-10-CM | POA: Insufficient documentation

## 2014-08-31 DIAGNOSIS — E669 Obesity, unspecified: Secondary | ICD-10-CM | POA: Diagnosis not present

## 2014-08-31 DIAGNOSIS — Z951 Presence of aortocoronary bypass graft: Secondary | ICD-10-CM | POA: Insufficient documentation

## 2014-08-31 DIAGNOSIS — Z86718 Personal history of other venous thrombosis and embolism: Secondary | ICD-10-CM | POA: Diagnosis not present

## 2014-08-31 DIAGNOSIS — E291 Testicular hypofunction: Secondary | ICD-10-CM | POA: Diagnosis not present

## 2014-08-31 DIAGNOSIS — N132 Hydronephrosis with renal and ureteral calculous obstruction: Secondary | ICD-10-CM | POA: Diagnosis not present

## 2014-08-31 DIAGNOSIS — Z87891 Personal history of nicotine dependence: Secondary | ICD-10-CM | POA: Diagnosis not present

## 2014-08-31 DIAGNOSIS — Z7982 Long term (current) use of aspirin: Secondary | ICD-10-CM | POA: Insufficient documentation

## 2014-08-31 DIAGNOSIS — Z79899 Other long term (current) drug therapy: Secondary | ICD-10-CM | POA: Insufficient documentation

## 2014-08-31 DIAGNOSIS — Z6828 Body mass index (BMI) 28.0-28.9, adult: Secondary | ICD-10-CM | POA: Diagnosis not present

## 2014-08-31 DIAGNOSIS — R109 Unspecified abdominal pain: Secondary | ICD-10-CM | POA: Diagnosis present

## 2014-08-31 DIAGNOSIS — E780 Pure hypercholesterolemia: Secondary | ICD-10-CM | POA: Diagnosis not present

## 2014-09-01 ENCOUNTER — Ambulatory Visit (HOSPITAL_COMMUNITY): Payer: 59 | Admitting: Anesthesiology

## 2014-09-01 ENCOUNTER — Emergency Department (HOSPITAL_COMMUNITY): Payer: 59

## 2014-09-01 ENCOUNTER — Encounter (HOSPITAL_COMMUNITY): Payer: Self-pay | Admitting: Emergency Medicine

## 2014-09-01 ENCOUNTER — Ambulatory Visit (HOSPITAL_COMMUNITY)
Admission: AD | Admit: 2014-09-01 | Discharge: 2014-09-01 | Disposition: A | Discharge status: Home / Self Care | Payer: 59 | Source: Ambulatory Visit | Attending: Urology | Admitting: Urology

## 2014-09-01 ENCOUNTER — Encounter (HOSPITAL_COMMUNITY): Payer: Self-pay | Admitting: *Deleted

## 2014-09-01 ENCOUNTER — Other Ambulatory Visit: Payer: Self-pay | Admitting: Urology

## 2014-09-01 ENCOUNTER — Encounter (HOSPITAL_COMMUNITY)
Admission: AD | Disposition: A | Discharge status: Home / Self Care | Payer: Self-pay | Source: Ambulatory Visit | Attending: Urology

## 2014-09-01 HISTORY — DX: Adverse effect of unspecified anesthetic, initial encounter: T41.45XA

## 2014-09-01 HISTORY — DX: Other complications of anesthesia, initial encounter: T88.59XA

## 2014-09-01 HISTORY — PX: CYSTOSCOPY WITH RETROGRADE PYELOGRAM, URETEROSCOPY AND STENT PLACEMENT: SHX5789

## 2014-09-01 LAB — CBC WITH DIFFERENTIAL/PLATELET
Basophils Absolute: 0 10*3/uL (ref 0.0–0.1)
Basophils Relative: 0 % (ref 0–1)
Eosinophils Absolute: 0 10*3/uL (ref 0.0–0.7)
Eosinophils Relative: 0 % (ref 0–5)
HCT: 36.5 % — ABNORMAL LOW (ref 39.0–52.0)
Hemoglobin: 12.4 g/dL — ABNORMAL LOW (ref 13.0–17.0)
Lymphocytes Relative: 12 % (ref 12–46)
Lymphs Abs: 1.1 10*3/uL (ref 0.7–4.0)
MCH: 31 pg (ref 26.0–34.0)
MCHC: 34 g/dL (ref 30.0–36.0)
MCV: 91.3 fL (ref 78.0–100.0)
Monocytes Absolute: 0.6 10*3/uL (ref 0.1–1.0)
Monocytes Relative: 7 % (ref 3–12)
Neutro Abs: 7 10*3/uL (ref 1.7–7.7)
Neutrophils Relative %: 81 % — ABNORMAL HIGH (ref 43–77)
Platelets: 205 10*3/uL (ref 150–400)
RBC: 4 MIL/uL — ABNORMAL LOW (ref 4.22–5.81)
RDW: 13.2 % (ref 11.5–15.5)
WBC: 8.7 10*3/uL (ref 4.0–10.5)

## 2014-09-01 LAB — I-STAT CHEM 8, ED
BUN: 26 mg/dL — ABNORMAL HIGH (ref 6–20)
Calcium, Ion: 1.11 mmol/L — ABNORMAL LOW (ref 1.13–1.30)
Chloride: 103 mmol/L (ref 101–111)
Creatinine, Ser: 1.5 mg/dL — ABNORMAL HIGH (ref 0.61–1.24)
Glucose, Bld: 125 mg/dL — ABNORMAL HIGH (ref 65–99)
HCT: 38 % — ABNORMAL LOW (ref 39.0–52.0)
Hemoglobin: 12.9 g/dL — ABNORMAL LOW (ref 13.0–17.0)
Potassium: 3.9 mmol/L (ref 3.5–5.1)
Sodium: 142 mmol/L (ref 135–145)
TCO2: 26 mmol/L (ref 0–100)

## 2014-09-01 LAB — URINALYSIS, ROUTINE W REFLEX MICROSCOPIC
Bilirubin Urine: NEGATIVE
Glucose, UA: NEGATIVE mg/dL
Ketones, ur: NEGATIVE mg/dL
Leukocytes, UA: NEGATIVE
Nitrite: NEGATIVE
Protein, ur: 30 mg/dL — AB
Specific Gravity, Urine: 1.031 — ABNORMAL HIGH (ref 1.005–1.030)
Urobilinogen, UA: 0.2 mg/dL (ref 0.0–1.0)
pH: 6.5 (ref 5.0–8.0)

## 2014-09-01 LAB — URINE MICROSCOPIC-ADD ON

## 2014-09-01 SURGERY — CYSTOURETEROSCOPY, WITH RETROGRADE PYELOGRAM AND STENT INSERTION
Anesthesia: General | Site: Ureter | Laterality: Left

## 2014-09-01 MED ORDER — LIDOCAINE HCL (CARDIAC) 20 MG/ML IV SOLN
INTRAVENOUS | Status: AC
  Filled 2014-09-01: qty 5

## 2014-09-01 MED ORDER — CIPROFLOXACIN IN D5W 400 MG/200ML IV SOLN
INTRAVENOUS | Status: AC
  Filled 2014-09-01: qty 200

## 2014-09-01 MED ORDER — CIPROFLOXACIN IN D5W 400 MG/200ML IV SOLN
400.0000 mg | INTRAVENOUS | Status: AC
  Administered 2014-09-01: 400 mg via INTRAVENOUS

## 2014-09-01 MED ORDER — ACETAMINOPHEN 325 MG PO TABS: 650.0000 mg | ORAL_TABLET | ORAL | Status: DC | PRN

## 2014-09-01 MED ORDER — FENTANYL CITRATE (PF) 100 MCG/2ML IJ SOLN
INTRAMUSCULAR | Status: AC
  Filled 2014-09-01: qty 4

## 2014-09-01 MED ORDER — PROPOFOL 10 MG/ML IV BOLUS
INTRAVENOUS | Status: DC | PRN
  Administered 2014-09-01: 200 mg via INTRAVENOUS

## 2014-09-01 MED ORDER — HYDROMORPHONE HCL 1 MG/ML IJ SOLN
1.0000 mg | Freq: Once | INTRAMUSCULAR | Status: AC
  Administered 2014-09-01: 1 mg via INTRAVENOUS
  Filled 2014-09-01: qty 1

## 2014-09-01 MED ORDER — PHENAZOPYRIDINE HCL 200 MG PO TABS: 200.0000 mg | ORAL_TABLET | Freq: Three times a day (TID) | ORAL | Status: DC | PRN

## 2014-09-01 MED ORDER — LACTATED RINGERS IV SOLN
INTRAVENOUS | Status: DC | PRN
  Administered 2014-09-01: 19:00:00 via INTRAVENOUS

## 2014-09-01 MED ORDER — ONDANSETRON 8 MG PO TBDP: 8.0000 mg | ORAL_TABLET | Freq: Three times a day (TID) | ORAL | Status: DC | PRN

## 2014-09-01 MED ORDER — ONDANSETRON HCL 4 MG/2ML IJ SOLN
INTRAMUSCULAR | Status: DC | PRN
  Administered 2014-09-01: 4 mg via INTRAVENOUS

## 2014-09-01 MED ORDER — PHENYLEPHRINE 40 MCG/ML (10ML) SYRINGE FOR IV PUSH (FOR BLOOD PRESSURE SUPPORT)
PREFILLED_SYRINGE | INTRAVENOUS | Status: AC
  Filled 2014-09-01: qty 10

## 2014-09-01 MED ORDER — SODIUM CHLORIDE 0.9 % IJ SOLN: 3.0000 mL | Freq: Two times a day (BID) | INTRAMUSCULAR | Status: DC

## 2014-09-01 MED ORDER — FENTANYL CITRATE (PF) 100 MCG/2ML IJ SOLN: 25.0000 ug | INTRAMUSCULAR | Status: DC | PRN

## 2014-09-01 MED ORDER — LIDOCAINE HCL (CARDIAC) 20 MG/ML IV SOLN
INTRAVENOUS | Status: DC | PRN
  Administered 2014-09-01: 100 mg via INTRAVENOUS

## 2014-09-01 MED ORDER — ONDANSETRON HCL 4 MG/2ML IJ SOLN
4.0000 mg | Freq: Once | INTRAMUSCULAR | Status: AC
  Administered 2014-09-01: 4 mg via INTRAVENOUS
  Filled 2014-09-01: qty 2

## 2014-09-01 MED ORDER — OXYCODONE HCL 5 MG PO TABS: 5.0000 mg | ORAL_TABLET | ORAL | Status: DC | PRN

## 2014-09-01 MED ORDER — FENTANYL CITRATE (PF) 100 MCG/2ML IJ SOLN
INTRAMUSCULAR | Status: AC
  Administered 2014-09-01: 25 ug via INTRAVENOUS
  Filled 2014-09-01: qty 2

## 2014-09-01 MED ORDER — ACETAMINOPHEN 650 MG RE SUPP: 650.0000 mg | RECTAL | Status: DC | PRN

## 2014-09-01 MED ORDER — SODIUM CHLORIDE 0.9 % IV SOLN: 250.0000 mL | INTRAVENOUS | Status: DC | PRN

## 2014-09-01 MED ORDER — LIDOCAINE HCL 2 % EX GEL
CUTANEOUS | Status: AC
  Filled 2014-09-01: qty 10

## 2014-09-01 MED ORDER — SODIUM CHLORIDE 0.9 % IV BOLUS (SEPSIS)
1000.0000 mL | Freq: Once | INTRAVENOUS | Status: AC
  Administered 2014-09-01: 1000 mL via INTRAVENOUS

## 2014-09-01 MED ORDER — PROPOFOL 10 MG/ML IV BOLUS
INTRAVENOUS | Status: AC
  Filled 2014-09-01: qty 20

## 2014-09-01 MED ORDER — FENTANYL CITRATE (PF) 100 MCG/2ML IJ SOLN
25.0000 ug | INTRAMUSCULAR | Status: DC | PRN
  Administered 2014-09-01: 25 ug via INTRAVENOUS

## 2014-09-01 MED ORDER — OXYCODONE HCL 5 MG PO TABS: 5.0000 mg | ORAL_TABLET | Freq: Once | ORAL | Status: DC | PRN

## 2014-09-01 MED ORDER — DEXAMETHASONE SODIUM PHOSPHATE 10 MG/ML IJ SOLN
INTRAMUSCULAR | Status: DC | PRN
  Administered 2014-09-01: 10 mg via INTRAVENOUS

## 2014-09-01 MED ORDER — OXYCODONE-ACETAMINOPHEN 5-325 MG PO TABS: 1.0000 | ORAL_TABLET | ORAL | Status: DC | PRN

## 2014-09-01 MED ORDER — HYDROMORPHONE HCL 1 MG/ML IJ SOLN
0.5000 mg | Freq: Once | INTRAMUSCULAR | Status: AC
  Administered 2014-09-01: 0.5 mg via INTRAVENOUS
  Filled 2014-09-01: qty 1

## 2014-09-01 MED ORDER — OXYCODONE HCL 5 MG/5ML PO SOLN: 5.0000 mg | Freq: Once | ORAL | Status: DC | PRN

## 2014-09-01 MED ORDER — SODIUM CHLORIDE 0.9 % IJ SOLN: 3.0000 mL | INTRAMUSCULAR | Status: DC | PRN

## 2014-09-01 MED ORDER — IOHEXOL 300 MG/ML  SOLN
INTRAMUSCULAR | Status: DC | PRN
  Administered 2014-09-01: 12 mg

## 2014-09-01 MED ORDER — ONDANSETRON HCL 4 MG/2ML IJ SOLN
INTRAMUSCULAR | Status: AC
  Filled 2014-09-01: qty 2

## 2014-09-01 MED ORDER — PHENYLEPHRINE HCL 10 MG/ML IJ SOLN
INTRAMUSCULAR | Status: DC | PRN
  Administered 2014-09-01: 80 ug via INTRAVENOUS

## 2014-09-01 MED ORDER — FENTANYL CITRATE (PF) 100 MCG/2ML IJ SOLN
INTRAMUSCULAR | Status: DC | PRN
  Administered 2014-09-01 (×2): 50 ug via INTRAVENOUS

## 2014-09-01 MED ORDER — DEXAMETHASONE SODIUM PHOSPHATE 10 MG/ML IJ SOLN
INTRAMUSCULAR | Status: AC
  Filled 2014-09-01: qty 1

## 2014-09-01 MED ORDER — ONDANSETRON HCL 4 MG/2ML IJ SOLN: 4.0000 mg | Freq: Four times a day (QID) | INTRAMUSCULAR | Status: DC | PRN

## 2014-09-01 SURGICAL SUPPLY — 17 items
BAG URO CATCHER STRL LF (DRAPE) ×2 IMPLANT
CATH URET 5FR 28IN OPEN ENDED (CATHETERS) IMPLANT
CLOTH BEACON ORANGE TIMEOUT ST (SAFETY) ×2 IMPLANT
FIBER LASER FLEXIVA 1000 (UROLOGICAL SUPPLIES) IMPLANT
FIBER LASER FLEXIVA 200 (UROLOGICAL SUPPLIES) IMPLANT
FIBER LASER FLEXIVA 365 (UROLOGICAL SUPPLIES) IMPLANT
FIBER LASER FLEXIVA 550 (UROLOGICAL SUPPLIES) IMPLANT
FIBER LASER TRAC TIP (UROLOGICAL SUPPLIES) IMPLANT
GLOVE SURG SS PI 8.0 STRL IVOR (GLOVE) IMPLANT
GOWN STRL REUS W/TWL XL LVL3 (GOWN DISPOSABLE) ×2 IMPLANT
GUIDEWIRE STR DUAL SENSOR (WIRE) IMPLANT
MANIFOLD NEPTUNE II (INSTRUMENTS) ×2 IMPLANT
PACK CYSTO (CUSTOM PROCEDURE TRAY) ×2 IMPLANT
SHEATH ACCESS URETERAL 38CM (SHEATH) ×2 IMPLANT
SHEATH ACCESS URETERAL 54CM (SHEATH) ×1 IMPLANT
STENT CONTOUR 6FRX26X.038 (STENTS) ×1 IMPLANT
TUBING CONNECTING 10 (TUBING) ×2 IMPLANT

## 2014-09-01 NOTE — Brief Op Note (Signed)
09/01/2014  7:32 PM  PATIENT:  [name] [name]  59 y.o. male  PRE-OPERATIVE DIAGNOSIS:  left proximal ureteral stone  POST-OPERATIVE DIAGNOSIS:  left proximal ureteral stone with UPJ stricture  PROCEDURE:  Procedure(s): CYSTOSCOPY WITH RETROGRADE PYELOGRAM, URETEROSCOPY AND STENT PLACEMENT (Left)   SURGEON:  Surgeon(s) and Role:    * Irine Seal, MD - Primary  PHYSICIAN ASSISTANT:   ASSISTANTS: none   ANESTHESIA:   general  EBL:     BLOOD ADMINISTERED:none  DRAINS: 6 x 26 JJ stent   LOCAL MEDICATIONS USED:  NONE  SPECIMEN:  No Specimen  DISPOSITION OF SPECIMEN:  N/A  COUNTS:  YES  TOURNIQUET:  * No tourniquets in log *  DICTATION: .Other Dictation: Dictation Number [id number]  PLAN OF CARE: Discharge to home after PACU  PATIENT DISPOSITION:  PACU - hemodynamically stable.   Delay start of Pharmacological VTE agent (>24hrs) due to surgical blood loss or risk of bleeding: not applicable

## 2014-09-01 NOTE — Anesthesia Preprocedure Evaluation (Signed)
Anesthesia Evaluation  Patient identified by MRN, date of birth, ID band Patient awake    Reviewed: Allergy & Precautions, NPO status , Patient's Chart, lab work & pertinent test results  Airway Mallampati: II   Neck ROM: full    Dental   Pulmonary former smoker,  breath sounds clear to auscultation        Cardiovascular hypertension, + CAD and + CABG Rhythm:regular Rate:Normal  CABG 2012.     Neuro/Psych    GI/Hepatic   Endo/Other    Renal/GU      Musculoskeletal   Abdominal   Peds  Hematology   Anesthesia Other Findings   Reproductive/Obstetrics                             Anesthesia Physical Anesthesia Plan  ASA: III  Anesthesia Plan: General   Post-op Pain Management:    Induction: Intravenous  Airway Management Planned: LMA  Additional Equipment:   Intra-op Plan:   Post-operative Plan:   Informed Consent: I have reviewed the patients History and Physical, chart, labs and discussed the procedure including the risks, benefits and alternatives for the proposed anesthesia with the patient or authorized representative who has indicated his/her understanding and acceptance.     Plan Discussed with: CRNA, Anesthesiologist and Surgeon  Anesthesia Plan Comments:         Anesthesia Quick Evaluation

## 2014-09-01 NOTE — H&P (Signed)
Active Problems Problems  1. Calculus of left ureter (N20.1) 2. Hypogonadism, testicular (E29.1) 3. Microscopic hematuria (R31.2)  History of Present Illness Mr. Arrants returns today with the onset last night of severe left flank pain with nausea. He went to the ER and a CT showed that his 25mm LLP stone is now in the proximal ureter. He appears to have had a forniceal rupture with perinephric fluid. The stone is 900HU but not seen on KUB because of the proximity to the spine. He has had no fever.  He has had no hematuria or voiding complaints.   Past Medical History Problems  1. History of Coronary artery disease (I25.10) 2. History of DVT of leg (deep venous thrombosis) (I82.409) 3. History of arthritis (Z87.39) 4. History of cardiac disorder (Z86.79) 5. History of cardiac murmur (Z86.79) 6. History of hypercholesterolemia (Z86.39) 7. History of hypertension (Z86.79)  Surgical History Problems  1. History of CABG (CABG) 2. History of Heart Surgery 3. History of Hemorrhoidectomy 4. History of Surgery Of Male Genitalia Vasectomy  Current Meds 1. Aspirin 325 MG Oral Tablet;  Therapy: (Recorded:06Jul2016) to Recorded 2. Losartan Potassium 100 MG Oral Tablet;  Therapy: (Recorded:22Aug2016) to Recorded 3. Metoprolol Tartrate 25 MG Oral Tablet;  Therapy: (Recorded:06Jul2016) to Recorded 4. Simvastatin 40 MG Oral Tablet;  Therapy: (Recorded:06Jul2016) to Recorded 5. Tylenol TABS;  Therapy: (Recorded:06Jul2016) to Recorded  Allergies Medication  1. codeine  Family History Problems  1. Family history of Leukemia, chronic : Mother  Social History Problems  1. Denied: History of Alcohol use 2. Caffeine use (F15.90)   1 glass every 2 days 3. Former smoker [phone])   smoked 1/2 ppd for 9 years, quit in 1984 4. Married 5. Number of children   3 daughters 6. Occupation   Nurse, learning disability  Review of Systems Genitourinary, constitutional, skin, eye, otolaryngeal,  hematologic/lymphatic, cardiovascular, pulmonary, endocrine, musculoskeletal, gastrointestinal, neurological and psychiatric system(s) were reviewed and pertinent findings if present are noted and are otherwise negative.  Gastrointestinal: nausea and flank pain.    Vitals Vital Signs [Data Includes: Last 1 Day]  Recorded: 22Aug2016 09:54AM  Blood Pressure: 108 / 71 Temperature: 97.9 F Heart Rate: 61  Physical Exam Constitutional: Well nourished and well developed . No acute distress.  Neck: The appearance of the neck is normal and no neck mass is present.  Pulmonary: No respiratory distress and normal respiratory rhythm and effort.  Cardiovascular: Heart rate and rhythm are normal . No peripheral edema.  Abdomen: No masses are palpated. Moderate tenderness in the LLQ is present. No CVA tenderness. No hepatosplenomegaly noted.    Results/Data Urine [Data Includes: Last 1 Day]   22Aug2016  COLOR YELLOW   APPEARANCE CLEAR   SPECIFIC GRAVITY 1.025   pH 6.0   GLUCOSE NEGATIVE   BILIRUBIN NEGATIVE   KETONE NEGATIVE   BLOOD 3+   PROTEIN TRACE   NITRITE NEGATIVE   LEUKOCYTE ESTERASE NEGATIVE   SQUAMOUS EPITHELIAL/HPF 0-5 HPF  WBC 0-5 WBC/HPF  RBC 40-60 RBC/HPF  BACTERIA NONE SEEN HPF  CRYSTALS NONE SEEN HPF  CASTS NONE SEEN LPF  Yeast NONE SEEN HPF   The following images/tracing/specimen were independently visualized:  I have reviewed his ER CT and KUB films and reports.  The following clinical lab reports were reviewed:  UA reviewed.    Assessment Assessed  1. Calculus of left ureter (N20.1)  He has a symptomatic 69mm left proximal stone with pain.   Plan Calculus of left ureter  1. Administer: Ketorolac  Tromethamine 60 MG/2ML Intramuscular Solution; INJECT 60  MG  Intramuscular; To Be Done: 22Aug2016 2. Follow-up Schedule Surgery Office  Follow-up  Status: Hold For - Appointment   Requested for: 22Aug2016 Health Maintenance  3. UA With REFLEX; [Do Not Release];  Status:Resulted - Requires Verification;   Done:  22Aug2016 09:41AM  Toradol 60mg  IM given today.  I am going to set him up for cystoscopy with possible ureteroscopy and laser with ureteral stent insertion later today.  I have reviewed the risk of bleeding, infection, ureteral injury, need for secondary procedures, thrombotic events and anesthetic complications.   Discussion/Summary CC: Dr. Christinia Gully.

## 2014-09-01 NOTE — ED Notes (Signed)
Requested patient to urinate. 

## 2014-09-01 NOTE — ED Provider Notes (Addendum)
CSN: [id number]     Arrival date & time 08/31/14  2327 History   First MD Initiated Contact with Patient 08/31/14 2349     No chief complaint on file.    (Consider location/radiation/quality/duration/timing/severity/associated sxs/prior Treatment) HPI Comments: Onset left flank pain tonight associated with nausea. No vomiting or fever. No hematuria or dysuria. He reports being seen by Dr. Jeffie Pollock approximately one month ago for evaluation of microscopic hematuria found on routine exam by his PCP. He was diagnosed with large stone in the left kidney that was not problematic at the time of diagnosis.   The history is provided by the patient. No language interpreter was used.    Past Medical History  Diagnosis Date  . Essential hypertension, benign   . Obesity   . Colon polyps   . Chest pain   . Hyperlipemia   . CAD (coronary artery disease)   . Tobacco abuse   . Heart murmur    Past Surgical History  Procedure Laterality Date  . Stapled hemorrhoidopexy.  10/19/2006  . Coronary artery bypass grafting x4  10/21/2010   Family History  Problem Relation Age of Onset  . Hypertension Father   . Leukemia Mother   . Hypertension Mother    Social History  Substance Use Topics  . Smoking status: Former Smoker -- 0.30 packs/day for 7 years    Types: Cigarettes    Quit date: 01/10/1981  . Smokeless tobacco: Not on file  . Alcohol Use: No    Review of Systems  Constitutional: Negative for fever and chills.  Gastrointestinal: Positive for nausea.  Genitourinary: Positive for flank pain. Negative for hematuria and difficulty urinating.  Musculoskeletal: Negative.   Skin: Negative.   Neurological: Negative.       Allergies  Codeine  Home Medications   Prior to Admission medications   Medication Sig Start Date End Date Taking? Authorizing Provider  acetaminophen (TYLENOL) 500 MG tablet Take 1,000 mg by mouth every 6 (six) hours as needed.   Yes Historical Provider, MD   aspirin 325 MG EC tablet Take 325 mg by mouth daily.     Yes Historical Provider, MD  Glucosamine 500 MG CAPS Take 2 capsules by mouth daily.   Yes Historical Provider, MD  losartan-hydrochlorothiazide (HYZAAR) 100-25 MG per tablet Take 1 tablet by mouth daily. 02/11/14  Yes Josue Hector, MD  metoprolol tartrate (LOPRESSOR) 25 MG tablet TAKE 1 TABLET (25 MG TOTAL) BY MOUTH 2 (TWO) TIMES DAILY. 06/17/14  Yes Tanda Rockers, MD  simvastatin (ZOCOR) 40 MG tablet TAKE 1 TABLET BY MOUTH AT BEDTIME 09/17/13  Yes Tanda Rockers, MD  metoprolol tartrate (LOPRESSOR) 25 MG tablet TAKE 1 TABLET (25 MG TOTAL) BY MOUTH 2 (TWO) TIMES DAILY. Patient not taking: Reported on 08/31/2014 05/23/14   Tanda Rockers, MD  nitroGLYCERIN (NITROSTAT) 0.4 MG SL tablet Place 1 tablet (0.4 mg total) under the tongue every 5 (five) minutes as needed for chest pain. 02/11/14   Josue Hector, MD   BP 167/88 mmHg  Pulse 62  Temp(Src) 98.8 F (37.1 C) (Oral)  Resp 20  SpO2 99% Physical Exam  Constitutional: He is oriented to person, place, and time. He appears well-developed and well-nourished.  Uncomfortable appearing.   HENT:  Head: Normocephalic.  Neck: Normal range of motion. Neck supple.  Cardiovascular: Normal rate and regular rhythm.   Pulmonary/Chest: Effort normal and breath sounds normal.  Abdominal: Soft. Bowel sounds are normal. There is no tenderness. There  is no rebound and no guarding.  Abdomen is nontender.   Genitourinary:  No CVA tenderness appreciated on left.   Musculoskeletal: Normal range of motion.  Neurological: He is alert and oriented to person, place, and time.  Skin: Skin is warm and dry. No rash noted.  Psychiatric: He has a normal mood and affect.    ED Course  Procedures (including critical care time) Labs Review Labs Reviewed  URINALYSIS, ROUTINE W REFLEX MICROSCOPIC (NOT AT [name])   Results for orders placed or performed during the hospital encounter of 08/31/14  Urinalysis, Routine  w reflex microscopic (not at [facility])  Result Value Ref Range   Color, Urine YELLOW YELLOW   APPearance CLOUDY (A) CLEAR   Specific Gravity, Urine 1.031 (H) 1.005 - 1.030   pH 6.5 5.0 - 8.0   Glucose, UA NEGATIVE NEGATIVE mg/dL   Hgb urine dipstick LARGE (A) NEGATIVE   Bilirubin Urine NEGATIVE NEGATIVE   Ketones, ur NEGATIVE NEGATIVE mg/dL   Protein, ur 30 (A) NEGATIVE mg/dL   Urobilinogen, UA 0.2 0.0 - 1.0 mg/dL   Nitrite NEGATIVE NEGATIVE   Leukocytes, UA NEGATIVE NEGATIVE  CBC with Differential  Result Value Ref Range   WBC 8.7 4.0 - 10.5 K/uL   RBC 4.00 (L) 4.22 - 5.81 MIL/uL   Hemoglobin 12.4 (L) 13.0 - 17.0 g/dL   HCT 36.5 (L) 39.0 - 52.0 %   MCV 91.3 78.0 - 100.0 fL   MCH 31.0 26.0 - 34.0 pg   MCHC 34.0 30.0 - 36.0 g/dL   RDW 13.2 11.5 - 15.5 %   Platelets 205 150 - 400 K/uL   Neutrophils Relative % 81 (H) 43 - 77 %   Neutro Abs 7.0 1.7 - 7.7 K/uL   Lymphocytes Relative 12 12 - 46 %   Lymphs Abs 1.1 0.7 - 4.0 K/uL   Monocytes Relative 7 3 - 12 %   Monocytes Absolute 0.6 0.1 - 1.0 K/uL   Eosinophils Relative 0 0 - 5 %   Eosinophils Absolute 0.0 0.0 - 0.7 K/uL   Basophils Relative 0 0 - 1 %   Basophils Absolute 0.0 0.0 - 0.1 K/uL  Urine microscopic-add on  Result Value Ref Range   Squamous Epithelial / LPF RARE RARE   WBC, UA 0-2 <3 WBC/hpf   RBC / HPF TOO NUMEROUS TO COUNT <3 RBC/hpf   Urine-Other MUCOUS PRESENT   I-stat Chem 8, ED  Result Value Ref Range   Sodium 142 135 - 145 mmol/L   Potassium 3.9 3.5 - 5.1 mmol/L   Chloride 103 101 - 111 mmol/L   BUN 26 (H) 6 - 20 mg/dL   Creatinine, Ser 1.50 (H) 0.61 - 1.24 mg/dL   Glucose, Bld 125 (H) 65 - 99 mg/dL   Calcium, Ion 1.11 (L) 1.13 - 1.30 mmol/L   TCO2 26 0 - 100 mmol/L   Hemoglobin 12.9 (L) 13.0 - 17.0 g/dL   HCT 38.0 (L) 39.0 - 52.0 %   Dg Abd 1 View  09/01/2014   CLINICAL DATA:  Left flank pain since 1900 hr on Sunday. Nausea. History of kidney stones.  EXAM: ABDOMEN - 1 VIEW  COMPARISON:  CT abdomen and  pelvis 07/24/2014  FINDINGS: Scattered gas and stool in the colon. No small or large bowel distention. Multiple calcifications in the pelvis or likely phleboliths. No discrete radiopaque stone is demonstrated over the kidneys. Degenerative changes in the spine and hips.  IMPRESSION: No radiopaque stones identified.  Electronically Signed   By: Lucienne Capers M.D.   On: 09/01/2014 00:51   Ct Renal Stone Study  09/01/2014   CLINICAL DATA:  Initial evaluation for acute left flank pain.  EXAM: CT ABDOMEN AND PELVIS WITHOUT CONTRAST  TECHNIQUE: Multidetector CT imaging of the abdomen and pelvis was performed following the standard protocol without IV contrast.  COMPARISON:  Prior CT from 07/24/2014  FINDINGS: Subsegmental atelectasis seen dependently within the visualized lung bases. Visualized lungs are otherwise clear. Coronary artery calcifications partially visualized.  Limited noncontrast evaluation of the liver is unremarkable. Gallbladder within normal limits. No biliary dilatation. Spleen, adrenal glands, and pancreas demonstrate a normal unenhanced appearance. Mild prominence of the distal pancreatic duct, stable.  Right kidney within normal limits without evidence of nephrolithiasis or hydronephrosis. No radiopaque calculi seen along the course of the right renal collecting system. There is no right-sided hydroureter.  Previously seen 8 mm left renal calculus has migrated into the proximal left ureter at the left UPJ with secondary moderate left hydronephrosis. There is prominent left perinephric fat stranding. Additional punctate nonobstructive stone present within the left kidney (series 4, image 43). No other stones seed distally within the left ureter.  Stomach within normal limits. No evidence for bowel obstruction. Appendix is normal. No acute inflammatory changes about the bowels.  Bladder and prostate within normal limits.  No free air. Small amount of free fluid adjacent to the inflamed left  kidney. No pathologically enlarged intra-abdominal or pelvic lymph nodes. Mild to moderate atheromatous plaque present within the intra-abdominal aorta. No aneurysm.  No acute osseous abnormality. No worrisome lytic or blastic osseous lesions. Degenerative disc disease present at L5-S1. Prominent degenerative osteoarthritic changes present left hip is well.  IMPRESSION: 1. Obstructive 8 mm left UPJ stone with secondary moderate left hydronephrosis and prominent left perinephric fat stranding. 2. Additional punctate nonobstructive left renal calculus. 3. No other acute intra-abdominal or pelvic process identified.   Electronically Signed   By: Jeannine Boga M.D.   On: 09/01/2014 03:10     Imaging Review No results found. I have personally reviewed and evaluated these images and lab results as part of my medical decision-making.   EKG Interpretation None      MDM   Final diagnoses:  None    1. Ureteral stone 2. Hydronephrosis  Pain is managed in the ED. No vomiting. No fever. Known 8 mm kidney stone now at the UPJ with mild hydronephrosis. Return precautions discussed. He will call to be seen by Dr. Jeffie Pollock tomorrow.     Charlann Lange, PA-C 09/01/14 [id number]  Linton Flemings, MD 09/01/14 [place], PA-C 10/01/14 [place], MD 10/10/14 1116

## 2014-09-01 NOTE — Discharge Instructions (Signed)
Kidney Stones Kidney stones (urolithiasis) are solid masses that form inside your kidneys. The intense pain is caused by the stone moving through the kidney, ureter, bladder, and urethra (urinary tract). When the stone moves, the ureter starts to spasm around the stone. The stone is usually passed in your pee (urine).  HOME CARE  Drink enough fluids to keep your pee clear or pale yellow. This helps to get the stone out.  Strain all pee through the provided strainer. Do not pee without peeing through the strainer, not even once. If you pee the stone out, catch it in the strainer. The stone may be as small as a grain of salt. Take this to your doctor. This will help your doctor figure out what you can do to try to prevent more kidney stones.  Only take medicine as told by your doctor.  Follow up with your doctor as told.  Get follow-up X-rays as told by your doctor. GET HELP IF: You have pain that gets worse even if you have been taking pain medicine. GET HELP RIGHT AWAY IF:   Your pain does not get better with medicine.  You have a fever or shaking chills.  Your pain increases and gets worse over 18 hours.  You have new belly (abdominal) pain.  You feel faint or pass out.  You are unable to pee. MAKE SURE YOU:   Understand these instructions.  Will watch your condition.  Will get help right away if you are not doing well or get worse. Document Released: 06/15/2007 Document Revised: 08/29/2012 Document Reviewed: 05/30/2012 [facility] Patient Information 2015 [place], [place]. This information is not intended to replace advice given to you by your health care provider. Make sure you discuss any questions you have with your health care provider.  Dietary Guidelines to Help Prevent Kidney Stones Your risk of kidney stones can be decreased by adjusting the foods you eat. The most important thing you can do is drink enough fluid. You should drink enough fluid to keep your urine clear or  pale yellow. The following guidelines provide specific information for the type of kidney stone you have had. GUIDELINES ACCORDING TO TYPE OF KIDNEY STONE Calcium Oxalate Kidney Stones  Reduce the amount of salt you eat. Foods that have a lot of salt cause your body to release excess calcium into your urine. The excess calcium can combine with a substance called oxalate to form kidney stones.  Reduce the amount of animal protein you eat if the amount you eat is excessive. Animal protein causes your body to release excess calcium into your urine. Ask your dietitian how much protein from animal sources you should be eating.  Avoid foods that are high in oxalates. If you take vitamins, they should have less than 500 mg of vitamin C. Your body turns vitamin C into oxalates. You do not need to avoid fruits and vegetables high in vitamin C. Calcium Phosphate Kidney Stones  Reduce the amount of salt you eat to help prevent the release of excess calcium into your urine.  Reduce the amount of animal protein you eat if the amount you eat is excessive. Animal protein causes your body to release excess calcium into your urine. Ask your dietitian how much protein from animal sources you should be eating.  Get enough calcium from food or take a calcium supplement (ask your dietitian for recommendations). Food sources of calcium that do not increase your risk of kidney stones include:  Broccoli.  Dairy products,  such as cheese and yogurt.  Pudding. Uric Acid Kidney Stones  Do not have more than 6 oz of animal protein per day. FOOD SOURCES Animal Protein Sources  Meat (all types).  Poultry.  Eggs.  Fish, seafood. Foods High in [employer] seasonings.  Soy sauce.  Teriyaki sauce.  Cured and processed meats.  Salted crackers and snack foods.  Fast food.  Canned soups and most canned foods. Foods High in Oxalates  Grains:  Amaranth.  Barley.  Grits.  Wheat  germ.  Bran.  Buckwheat flour.  All bran cereals.  Pretzels.  Whole wheat bread.  Vegetables:  Beans (wax).  Beets and beet greens.  Collard greens.  Eggplant.  [place].  Leeks.  Okra.  Parsley.  Rutabagas.  Spinach.  Swiss chard.  Tomato paste.  Fried potatoes.  Sweet potatoes.  Fruits:  Red currants.  Figs.  Kiwi.  Rhubarb.  Meat and Other Protein Sources:  Beans (dried).  Soy burgers and other soybean products.  Miso.  Nuts (peanuts, almonds, pecans, cashews, hazelnuts).  Nut butters.  Sesame seeds and tahini (paste made of sesame seeds).  Poppy seeds.  Beverages:  Chocolate drink mixes.  Soy milk.  Instant iced tea.  Juices made from high-oxalate fruits or vegetables.  Other:  Carob.  Chocolate.  Fruitcake.  Marmalades. Document Released: 04/23/2010 Document Revised: 01/01/2013 Document Reviewed: 11/23/2012 [facility] Patient Information 2015 [place], [place]. This information is not intended to replace advice given to you by your health care provider. Make sure you discuss any questions you have with your health care provider.

## 2014-09-01 NOTE — ED Notes (Signed)
Patient was diagnosed with an 8cm stone last month. Tonight he is having severe pain on left side. Patient is not having any trouble urinating.

## 2014-09-01 NOTE — Anesthesia Procedure Notes (Signed)
Procedure Name: LMA Insertion Date/Time: 09/01/2014 7:03 PM Performed by: Danley Danker L Patient Re-evaluated:Patient Re-evaluated prior to inductionOxygen Delivery Method: Circle system utilized Preoxygenation: Pre-oxygenation with 100% oxygen Intubation Type: IV induction Ventilation: Mask ventilation without difficulty LMA: LMA inserted LMA Size: 4.0 Number of attempts: 1 Airway Equipment and Method: Stylet Placement Confirmation: positive ETCO2 and breath sounds checked- equal and bilateral Tube secured with: Tape Dental Injury: Teeth and Oropharynx as per pre-operative assessment

## 2014-09-01 NOTE — Transfer of Care (Signed)
Immediate Anesthesia Transfer of Care Note  Patient: [name] [name]  Procedure(s) Performed: Procedure(s): CYSTOSCOPY WITH RETROGRADE PYELOGRAM, URETEROSCOPY AND STENT PLACEMENT (Left) HOLMIUM LASER APPLICATION (Left)  Patient Location: PACU  Anesthesia Type:General  Level of Consciousness: awake, alert  and oriented  Airway & Oxygen Therapy: Patient Spontanous Breathing and Patient connected to face mask oxygen  Post-op Assessment: Report given to RN and Post -op Vital signs reviewed and stable  Post vital signs: Reviewed and stable  Last Vitals:  Filed Vitals:   09/01/14 1451  BP: 133/80  Pulse: 55  Temp: 36.8 C  Resp: 16    Complications: No apparent anesthesia complications

## 2014-09-01 NOTE — Discharge Instructions (Signed)

## 2014-09-02 ENCOUNTER — Other Ambulatory Visit: Payer: Self-pay | Admitting: Urology

## 2014-09-02 ENCOUNTER — Encounter (HOSPITAL_COMMUNITY): Payer: Self-pay | Admitting: Urology

## 2014-09-02 NOTE — Op Note (Signed)
[name] [name], [name] [name]             ACCOUNT NO.:  [id number]  MEDICAL RECORD NO.:  [id number]  LOCATION:  [facility]                         FACILITY:  [facility]  PHYSICIAN:  Marshall Cork. Jeffie Pollock, M.D.    DATE OF BIRTH:  1955-04-27  DATE OF PROCEDURE:  09/01/2014 DATE OF DISCHARGE:  09/01/2014                              OPERATIVE REPORT   PROCEDURES: 1. Cystoscopy with left retrograde pyelogram and interpretation. 2. Left ureteroscopy. 3. Insertion of left double-J stent.  PREOPERATIVE DIAGNOSIS:  Left ureteropelvic junction stone.  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  SURGEON:  Marshall Cork. Jeffie Pollock, M.D.  ANESTHESIA:  General.  SPECIMEN:  None.  BLOOD LOSS:  None.  DRAINS:  A 6-French x 26 cm left double-J stent.  COMPLICATIONS:  None.  INDICATIONS:  [name] [name] is a 59 year old African American male, who was sent from the emergency room with an 8-mm left UPJ stone with high- grade obstruction and forniceal rupture.  After reviewing the options, we have elected to do cystoscopy with retrograde pyelogram, attempted ureteroscopy and placement of double-J stent.  FINDINGS OF PROCEDURE:  He was given Cipro.  He was taken to the operating room where general anesthetic was induced.  He was placed in lithotomy position.  His perineum and genitalia were prepped with Betadine solution.  He was draped in usual sterile fashion.  He had been fitted with PAS hose prior to draping.  Cystoscopy was performed using the 22-French scope and 30-degree lens. Examination revealed a normal urethra.  The external sphincter was intact.  The prostatic urethra was approximately 3 cm in length with bilobar hyperplasia without significant obstruction.  Examination of the bladder revealed normal mucosa with mild trabeculation.  Ureteral orifices were unremarkable.  The left ureteral orifice was cannulated with a 5-French open-end catheter and contrast  was instilled.  The ureter was normal caliber up to the UPJ where the stone at the UPJ.  The stone flushed back into the renal pelvis with the contrast.  No additional filling defects were noted.  There was dilation of collecting system.  The UPJ was somewhat narrowed.  Once the retrograde pyelogram had been performed, a Sensor guidewire was passed to the kidney and a 55-cm 12-French access sheath inner core was passed over the wire to the kidney.  I was easily able to get it up to the UPJ, but could not cross the UPJ with a dilator.  At this point, the dilator was reassembled with the sheath and inserted to just below the UPJ.  The wire was removed and a dual-lumen digital flexible scope was passed to the level of the UPJ.  I was unable to negotiate it through the narrowed UPJ and there was some mucosal splitting just below the UPJ from the access sheath dilation.  At this point, I felt the most appropriate course of action would be to replace the wire and then stent to the ureter and return in later date for definitive ureteroscopy or lithotripsy.  The guidewire was then reinserted through the scope into the renal pelvis visually and also guided fluoroscopically.  The access sheath and ureteroscope were then removed leaving the wire in good  position.  The cystoscope was reinserted over the wire and a 6-French 26-cm double-J stent without string was inserted to the kidney under fluoroscopic guidance.  The wire was removed leaving good coil in the kidney and good coil in the bladder.  The bladder was then drained.  The cystoscope was removed.  The patient was taken down from lithotomy position.  His anesthetic was reversed.  He was moved to the recovery room in stable condition.  There were no complications.     Marshall Cork. Jeffie Pollock, M.D.     JJW/MEDQ  D:  09/01/2014  T:  09/02/2014  Job:  [id number]

## 2014-09-04 NOTE — Patient Instructions (Signed)
DEANTRE BOURDON  09/04/2014   Your procedure is scheduled on: Tuesday 09/09/2014  Report to [facility] Main  Entrance take [place]  elevators to 3rd floor to  [place] at  0700 AM.  Call this number if you have problems the morning of surgery [phone]   Remember: ONLY 1 PERSON MAY GO WITH YOU TO SHORT STAY TO GET  READY MORNING OF [place].   Do not eat food or drink liquids :After Midnight.     Take these medicines the morning of surgery with A SIP OF WATER: Metoprolol                               You may not have any metal on your body including hair pins and              piercings  Do not wear jewelry, make-up, lotions, powders or perfumes, deodorant             Do not wear nail polish.  Do not shave  48 hours prior to surgery.              Men may shave face and neck.   Do not bring valuables to the hospital. [place].  Contacts, dentures or bridgework may not be worn into surgery.  Leave suitcase in the car. After surgery it may be brought to your room.     Patients discharged the day of surgery will not be allowed to drive home.  Name and phone number of your driver:  Special Instructions: N/A              Please read over the following fact sheets you were given: _____________________________________________________________________             [facility] - Preparing for Surgery Before surgery, you can play an important role.  Because skin is not sterile, your skin needs to be as free of germs as possible.  You can reduce the number of germs on your skin by washing with CHG (chlorahexidine gluconate) soap before surgery.  CHG is an antiseptic cleaner which kills germs and bonds with the skin to continue killing germs even after washing. Please DO NOT use if you have an allergy to CHG or antibacterial soaps.  If your skin becomes reddened/irritated stop using the CHG and inform your  nurse when you arrive at Short Stay. Do not shave (including legs and underarms) for at least 48 hours prior to the first CHG shower.  You may shave your face/neck. Please follow these instructions carefully:  1.  Shower with CHG Soap the night before surgery and the  morning of Surgery.  2.  If you choose to wash your hair, wash your hair first as usual with your  normal  shampoo.  3.  After you shampoo, rinse your hair and body thoroughly to remove the  shampoo.                           4.  Use CHG as you would any other liquid soap.  You can apply chg directly  to the skin and wash  Gently with a scrungie or clean washcloth.  5.  Apply the CHG Soap to your body ONLY FROM THE NECK DOWN.   Do not use on face/ open                           Wound or open sores. Avoid contact with eyes, ears mouth and genitals (private parts).                       Wash face,  Genitals (private parts) with your normal soap.             6.  Wash thoroughly, paying special attention to the area where your surgery  will be performed.  7.  Thoroughly rinse your body with warm water from the neck down.  8.  DO NOT shower/wash with your normal soap after using and rinsing off  the CHG Soap.                9.  Pat yourself dry with a clean towel.            10.  Wear clean pajamas.            11.  Place clean sheets on your bed the night of your first shower and do not  sleep with pets. Day of Surgery : Do not apply any lotions/deodorants the morning of surgery.  Please wear clean clothes to the hospital/surgery center.  FAILURE TO FOLLOW THESE INSTRUCTIONS MAY RESULT IN THE CANCELLATION OF YOUR SURGERY PATIENT SIGNATURE_________________________________  NURSE SIGNATURE__________________________________  ________________________________________________________________________   Adam Phenix  An incentive spirometer is a tool that can help keep your lungs clear and active. This tool  measures how well you are filling your lungs with each breath. Taking long deep breaths may help reverse or decrease the chance of developing breathing (pulmonary) problems (especially infection) following:  A long period of time when you are unable to move or be active. BEFORE THE PROCEDURE   If the spirometer includes an indicator to show your best effort, your nurse or respiratory therapist will set it to a desired goal.  If possible, sit up straight or lean slightly forward. Try not to slouch.  Hold the incentive spirometer in an upright position. INSTRUCTIONS FOR USE  1. Sit on the edge of your bed if possible, or sit up as far as you can in bed or on a chair. 2. Hold the incentive spirometer in an upright position. 3. Breathe out normally. 4. Place the mouthpiece in your mouth and seal your lips tightly around it. 5. Breathe in slowly and as deeply as possible, raising the piston or the ball toward the top of the column. 6. Hold your breath for 3-5 seconds or for as long as possible. Allow the piston or ball to fall to the bottom of the column. 7. Remove the mouthpiece from your mouth and breathe out normally. 8. Rest for a few seconds and repeat Steps 1 through 7 at least 10 times every 1-2 hours when you are awake. Take your time and take a few normal breaths between deep breaths. 9. The spirometer may include an indicator to show your best effort. Use the indicator as a goal to work toward during each repetition. 10. After each set of 10 deep breaths, practice coughing to be sure your lungs are clear. If you have an incision (the cut made at the time of surgery),  support your incision when coughing by placing a pillow or rolled up towels firmly against it. Once you are able to get out of bed, walk around indoors and cough well. You may stop using the incentive spirometer when instructed by your caregiver.  RISKS AND COMPLICATIONS  Take your time so you do not get dizzy or  light-headed.  If you are in pain, you may need to take or ask for pain medication before doing incentive spirometry. It is harder to take a deep breath if you are having pain. AFTER USE  Rest and breathe slowly and easily.  It can be helpful to keep track of a log of your progress. Your caregiver can provide you with a simple table to help with this. If you are using the spirometer at home, follow these instructions: [place] IF:   You are having difficultly using the spirometer.  You have trouble using the spirometer as often as instructed.  Your pain medication is not giving enough relief while using the spirometer.  You develop fever of 100.5 F (38.1 C) or higher. SEEK IMMEDIATE MEDICAL CARE IF:   You cough up bloody sputum that had not been present before.  You develop fever of 102 F (38.9 C) or greater.  You develop worsening pain at or near the incision site. MAKE SURE YOU:   Understand these instructions.  Will watch your condition.  Will get help right away if you are not doing well or get worse. Document Released: 05/09/2006 Document Revised: 03/21/2011 Document Reviewed: 07/10/2006 [facility] Patient Information 2014 [place], [place].   ________________________________________________________________________

## 2014-09-05 ENCOUNTER — Encounter (HOSPITAL_COMMUNITY)
Admission: RE | Admit: 2014-09-05 | Discharge: 2014-09-05 | Disposition: A | Discharge status: Home / Self Care | Payer: 59 | Source: Ambulatory Visit | Attending: Urology | Admitting: Urology

## 2014-09-05 ENCOUNTER — Encounter (HOSPITAL_COMMUNITY): Payer: Self-pay

## 2014-09-05 DIAGNOSIS — Z01818 Encounter for other preprocedural examination: Secondary | ICD-10-CM | POA: Insufficient documentation

## 2014-09-05 HISTORY — DX: Headache, unspecified: R51.9

## 2014-09-05 HISTORY — DX: Headache: R51

## 2014-09-05 LAB — BASIC METABOLIC PANEL
Anion gap: 8 (ref 5–15)
BUN: 24 mg/dL — ABNORMAL HIGH (ref 6–20)
CO2: 30 mmol/L (ref 22–32)
Calcium: 9.7 mg/dL (ref 8.9–10.3)
Chloride: 102 mmol/L (ref 101–111)
Creatinine, Ser: 1.05 mg/dL (ref 0.61–1.24)
GFR calc Af Amer: >60 mL/min (ref >60)
GFR calc non Af Amer: >60 mL/min (ref >60)
Glucose, Bld: 125 mg/dL — ABNORMAL HIGH (ref 65–99)
Potassium: 3.6 mmol/L (ref 3.5–5.1)
Sodium: 140 mmol/L (ref 135–145)

## 2014-09-05 NOTE — Progress Notes (Signed)
   09/05/14 0906  OBSTRUCTIVE SLEEP APNEA  Have you ever been diagnosed with sleep apnea through a sleep study? No  Do you snore loudly (loud enough to be heard through closed doors)?  0  Do you often feel tired, fatigued, or sleepy during the daytime? 0  Has anyone observed you stop breathing during your sleep? 0  Do you have, or are you being treated for high blood pressure? 1  BMI more than 35 kg/m2? 0  Age over 60 years old? 1  Neck circumference greater than 40 cm/16 inches? 1  Gender: 1

## 2014-09-09 ENCOUNTER — Ambulatory Visit (HOSPITAL_COMMUNITY): Payer: 59 | Admitting: Anesthesiology

## 2014-09-09 ENCOUNTER — Ambulatory Visit (HOSPITAL_COMMUNITY)
Admission: RE | Admit: 2014-09-09 | Discharge: 2014-09-09 | Disposition: A | Discharge status: Home / Self Care | Payer: 59 | Source: Ambulatory Visit | Attending: Urology | Admitting: Urology

## 2014-09-09 ENCOUNTER — Ambulatory Visit (HOSPITAL_COMMUNITY): Payer: 59

## 2014-09-09 ENCOUNTER — Encounter (HOSPITAL_COMMUNITY): Payer: Self-pay | Admitting: *Deleted

## 2014-09-09 ENCOUNTER — Encounter (HOSPITAL_COMMUNITY)
Admission: RE | Disposition: A | Discharge status: Home / Self Care | Payer: Self-pay | Source: Ambulatory Visit | Attending: Urology

## 2014-09-09 DIAGNOSIS — E780 Pure hypercholesterolemia: Secondary | ICD-10-CM | POA: Insufficient documentation

## 2014-09-09 DIAGNOSIS — I2510 Atherosclerotic heart disease of native coronary artery without angina pectoris: Secondary | ICD-10-CM | POA: Diagnosis not present

## 2014-09-09 DIAGNOSIS — Z86718 Personal history of other venous thrombosis and embolism: Secondary | ICD-10-CM | POA: Diagnosis not present

## 2014-09-09 DIAGNOSIS — I10 Essential (primary) hypertension: Secondary | ICD-10-CM | POA: Insufficient documentation

## 2014-09-09 DIAGNOSIS — Z79899 Other long term (current) drug therapy: Secondary | ICD-10-CM | POA: Diagnosis not present

## 2014-09-09 DIAGNOSIS — N201 Calculus of ureter: Secondary | ICD-10-CM | POA: Insufficient documentation

## 2014-09-09 DIAGNOSIS — M1990 Unspecified osteoarthritis, unspecified site: Secondary | ICD-10-CM | POA: Insufficient documentation

## 2014-09-09 DIAGNOSIS — Z7982 Long term (current) use of aspirin: Secondary | ICD-10-CM | POA: Insufficient documentation

## 2014-09-09 DIAGNOSIS — Z87891 Personal history of nicotine dependence: Secondary | ICD-10-CM | POA: Diagnosis not present

## 2014-09-09 DIAGNOSIS — Z791 Long term (current) use of non-steroidal anti-inflammatories (NSAID): Secondary | ICD-10-CM | POA: Insufficient documentation

## 2014-09-09 DIAGNOSIS — R109 Unspecified abdominal pain: Secondary | ICD-10-CM | POA: Diagnosis present

## 2014-09-09 HISTORY — PX: HOLMIUM LASER APPLICATION: SHX5852

## 2014-09-09 HISTORY — PX: CYSTOSCOPY WITH URETEROSCOPY AND STENT PLACEMENT: SHX6377

## 2014-09-09 SURGERY — CYSTOURETEROSCOPY, WITH STENT INSERTION
Anesthesia: General | Laterality: Left

## 2014-09-09 MED ORDER — ACETAMINOPHEN 325 MG PO TABS: 650.0000 mg | ORAL_TABLET | ORAL | Status: DC | PRN

## 2014-09-09 MED ORDER — IOHEXOL 300 MG/ML  SOLN
INTRAMUSCULAR | Status: DC | PRN
  Administered 2014-09-09: 10 mL

## 2014-09-09 MED ORDER — SODIUM CHLORIDE 0.9 % IJ SOLN: 3.0000 mL | INTRAMUSCULAR | Status: DC | PRN

## 2014-09-09 MED ORDER — ARTIFICIAL TEARS OP OINT
TOPICAL_OINTMENT | OPHTHALMIC | Status: AC
  Filled 2014-09-09: qty 3.5

## 2014-09-09 MED ORDER — MIDAZOLAM HCL 5 MG/5ML IJ SOLN
INTRAMUSCULAR | Status: DC | PRN
  Administered 2014-09-09: 1 mg via INTRAVENOUS

## 2014-09-09 MED ORDER — SODIUM CHLORIDE 0.9 % IR SOLN
Status: DC | PRN
  Administered 2014-09-09: 4000 mL

## 2014-09-09 MED ORDER — FENTANYL CITRATE (PF) 100 MCG/2ML IJ SOLN
INTRAMUSCULAR | Status: DC | PRN
  Administered 2014-09-09: 50 ug via INTRAVENOUS
  Administered 2014-09-09: 25 ug via INTRAVENOUS

## 2014-09-09 MED ORDER — ONDANSETRON HCL 4 MG/2ML IJ SOLN: 4.0000 mg | Freq: Once | INTRAMUSCULAR | Status: DC | PRN

## 2014-09-09 MED ORDER — SODIUM CHLORIDE 0.9 % IJ SOLN: 3.0000 mL | Freq: Two times a day (BID) | INTRAMUSCULAR | Status: DC

## 2014-09-09 MED ORDER — LACTATED RINGERS IV SOLN
INTRAVENOUS | Status: DC | PRN
  Administered 2014-09-09: 09:00:00 via INTRAVENOUS

## 2014-09-09 MED ORDER — LIDOCAINE HCL (CARDIAC) 20 MG/ML IV SOLN
INTRAVENOUS | Status: DC | PRN
  Administered 2014-09-09: 50 mg via INTRAVENOUS

## 2014-09-09 MED ORDER — DEXAMETHASONE SODIUM PHOSPHATE 10 MG/ML IJ SOLN
INTRAMUSCULAR | Status: AC
  Filled 2014-09-09: qty 2

## 2014-09-09 MED ORDER — DEXAMETHASONE SODIUM PHOSPHATE 10 MG/ML IJ SOLN
INTRAMUSCULAR | Status: DC | PRN
  Administered 2014-09-09: 10 mg via INTRAVENOUS

## 2014-09-09 MED ORDER — FENTANYL CITRATE (PF) 100 MCG/2ML IJ SOLN
25.0000 ug | INTRAMUSCULAR | Status: DC | PRN
  Administered 2014-09-09: 50 ug via INTRAVENOUS

## 2014-09-09 MED ORDER — LIDOCAINE HCL (CARDIAC) 20 MG/ML IV SOLN
INTRAVENOUS | Status: AC
  Filled 2014-09-09: qty 5

## 2014-09-09 MED ORDER — ONDANSETRON HCL 4 MG/2ML IJ SOLN
INTRAMUSCULAR | Status: DC | PRN
  Administered 2014-09-09: 4 mg via INTRAVENOUS

## 2014-09-09 MED ORDER — ONDANSETRON HCL 4 MG/2ML IJ SOLN
INTRAMUSCULAR | Status: AC
  Filled 2014-09-09: qty 2

## 2014-09-09 MED ORDER — FENTANYL CITRATE (PF) 100 MCG/2ML IJ SOLN: 25.0000 ug | INTRAMUSCULAR | Status: DC | PRN

## 2014-09-09 MED ORDER — DEXAMETHASONE SODIUM PHOSPHATE 10 MG/ML IJ SOLN
INTRAMUSCULAR | Status: AC
  Filled 2014-09-09: qty 1

## 2014-09-09 MED ORDER — ACETAMINOPHEN 650 MG RE SUPP
650.0000 mg | RECTAL | Status: DC | PRN
  Filled 2014-09-09: qty 1

## 2014-09-09 MED ORDER — PROPOFOL 10 MG/ML IV BOLUS
INTRAVENOUS | Status: AC
  Filled 2014-09-09: qty 20

## 2014-09-09 MED ORDER — FENTANYL CITRATE (PF) 100 MCG/2ML IJ SOLN
INTRAMUSCULAR | Status: AC
  Filled 2014-09-09: qty 2

## 2014-09-09 MED ORDER — OXYCODONE HCL 5 MG PO TABS: 5.0000 mg | ORAL_TABLET | ORAL | Status: DC | PRN

## 2014-09-09 MED ORDER — PROPOFOL 10 MG/ML IV BOLUS
INTRAVENOUS | Status: DC | PRN
  Administered 2014-09-09: 170 mg via INTRAVENOUS

## 2014-09-09 MED ORDER — CIPROFLOXACIN IN D5W 400 MG/200ML IV SOLN
INTRAVENOUS | Status: AC
  Filled 2014-09-09: qty 200

## 2014-09-09 MED ORDER — FENTANYL CITRATE (PF) 100 MCG/2ML IJ SOLN
INTRAMUSCULAR | Status: AC
  Filled 2014-09-09: qty 4

## 2014-09-09 MED ORDER — MIDAZOLAM HCL 2 MG/2ML IJ SOLN
INTRAMUSCULAR | Status: AC
  Filled 2014-09-09: qty 4

## 2014-09-09 MED ORDER — SODIUM CHLORIDE 0.9 % IV SOLN: 250.0000 mL | INTRAVENOUS | Status: DC | PRN

## 2014-09-09 MED ORDER — CIPROFLOXACIN IN D5W 400 MG/200ML IV SOLN
400.0000 mg | INTRAVENOUS | Status: AC
  Administered 2014-09-09: 400 mg via INTRAVENOUS

## 2014-09-09 SURGICAL SUPPLY — 17 items
BAG URO CATCHER STRL LF (DRAPE) ×2 IMPLANT
CATH URET 5FR 28IN OPEN ENDED (CATHETERS) ×1 IMPLANT
CLOTH BEACON ORANGE TIMEOUT ST (SAFETY) ×2 IMPLANT
EXTRACTOR STONE NITINOL NGAGE (UROLOGICAL SUPPLIES) ×1 IMPLANT
FIBER LASER FLEXIVA 1000 (UROLOGICAL SUPPLIES) IMPLANT
FIBER LASER FLEXIVA 200 (UROLOGICAL SUPPLIES) ×1 IMPLANT
FIBER LASER FLEXIVA 365 (UROLOGICAL SUPPLIES) IMPLANT
FIBER LASER FLEXIVA 550 (UROLOGICAL SUPPLIES) IMPLANT
FIBER LASER TRAC TIP (UROLOGICAL SUPPLIES) ×1 IMPLANT
GLOVE SURG SS PI 8.0 STRL IVOR (GLOVE) ×1 IMPLANT
GOWN STRL REUS W/TWL XL LVL3 (GOWN DISPOSABLE) ×2 IMPLANT
GUIDEWIRE STR DUAL SENSOR (WIRE) ×1 IMPLANT
MANIFOLD NEPTUNE II (INSTRUMENTS) ×2 IMPLANT
PACK CYSTO (CUSTOM PROCEDURE TRAY) ×2 IMPLANT
SHEATH ACCESS URETERAL 38CM (SHEATH) ×2 IMPLANT
STENT CONTOUR 6FRX26X.038 (STENTS) ×1 IMPLANT
TUBING CONNECTING 10 (TUBING) ×2 IMPLANT

## 2014-09-09 NOTE — Anesthesia Preprocedure Evaluation (Addendum)
Anesthesia Evaluation  Patient identified by MRN, date of birth, ID band Patient awake    Reviewed: Allergy & Precautions, NPO status , Patient's Chart, lab work & pertinent test results  History of Anesthesia Complications (+) history of anesthetic complications  Airway Mallampati: II   Neck ROM: full    Dental   Pulmonary former smoker,  breath sounds clear to auscultation        Cardiovascular hypertension, + CAD and + CABG Rhythm:regular Rate:Normal  CABG 2012.     Neuro/Psych    GI/Hepatic   Endo/Other    Renal/GU      Musculoskeletal   Abdominal   Peds  Hematology   Anesthesia Other Findings NPO appropriate, allergies reviewed Denies active cardiac or pulmonary symptoms, METS > 4 No recent congestive cough or symptoms of upper respiratory infection Last saw Dr. Johnsie Cancel with cardiology 06/2014 with no changes made, f/u in 6 months  Reproductive/Obstetrics                           Anesthesia Physical  Anesthesia Plan  ASA: III  Anesthesia Plan: General   Post-op Pain Management:    Induction: Intravenous  Airway Management Planned: LMA  Additional Equipment:   Intra-op Plan:   Post-operative Plan:   Informed Consent: I have reviewed the patients History and Physical, chart, labs and discussed the procedure including the risks, benefits and alternatives for the proposed anesthesia with the patient or authorized representative who has indicated his/her understanding and acceptance.     Plan Discussed with: CRNA, Anesthesiologist and Surgeon  Anesthesia Plan Comments:         Anesthesia Quick Evaluation

## 2014-09-09 NOTE — Discharge Instructions (Addendum)
CYSTOSCOPY HOME CARE INSTRUCTIONS  Activity: Rest for the remainder of the day.  Do not drive or operate equipment today.  You may resume normal activities in one to two days as instructed by your physician.   Meals: Drink plenty of liquids and eat light foods such as gelatin or soup this evening.  You may return to a normal meal plan tomorrow.  Return to Work: You may return to work in one to two days or as instructed by your physician.  Special Instructions / Symptoms: Call your physician if any of these symptoms occur:   -persistent or heavy bleeding  -bleeding which continues after first few urination  -large blood clots that are difficult to pass  -urine stream diminishes or stops completely  -fever equal to or higher than 101 degrees Farenheit.  -cloudy urine with a strong, foul odor  -severe pain  Females should always wipe from front to back after elimination.  You may feel some burning pain when you urinate.  This should disappear with time.  Applying moist heat to the lower abdomen or a hot tub bath may help relieve the pain. \  You may remove the stent by pulling the attached string on Friday morning.   If you don't feel comfortable with that, please contact the office to come in this Friday to have the stent removed.  Please bring your stone to the office for the appointment so we can send it for analysis.   Patient Signature:  ________________________________________________________  Nurse's Signature:  ________________________________________________________

## 2014-09-09 NOTE — Transfer of Care (Signed)
Immediate Anesthesia Transfer of Care Note  Patient: [name] [name]  Procedure(s) Performed: Procedure(s): CYSTOSCOPY,LEFT URETEROSCOPY AND STENT PLACEMENT (Left) WITH HOLMIUM LASER  (Left)  Patient Location: PACU  Anesthesia Type:General  Level of Consciousness:  sedated, patient cooperative and responds to stimulation  Airway & Oxygen Therapy:Patient Spontanous Breathing and Patient connected to face mask oxgen  Post-op Assessment:  Report given to PACU RN and Post -op Vital signs reviewed and stable  Post vital signs:  Reviewed and stable  Last Vitals:  Filed Vitals:   09/09/14 0657  BP: 142/89  Pulse: 65  Temp: 36.7 C  Resp: 18    Complications: No apparent anesthesia complications

## 2014-09-09 NOTE — H&P (View-Only) (Signed)
Active Problems Problems  1. Calculus of left ureter (N20.1) 2. Hypogonadism, testicular (E29.1) 3. Microscopic hematuria (R31.2)  History of Present Illness Mr. [name] [name] returns today with the onset last night of severe left flank pain with nausea. He went to the ER and a CT showed that his 8mm LLP stone is now in the proximal ureter. He appears to have had a forniceal rupture with perinephric fluid. The stone is 900HU but not seen on KUB because of the proximity to the spine. He has had no fever.  He has had no hematuria or voiding complaints.   Past Medical History Problems  1. History of Coronary artery disease (I25.10) 2. History of DVT of leg (deep venous thrombosis) (I82.409) 3. History of arthritis (Z87.39) 4. History of cardiac disorder (Z86.79) 5. History of cardiac murmur (Z86.79) 6. History of hypercholesterolemia (Z86.39) 7. History of hypertension (Z86.79)  Surgical History Problems  1. History of CABG (CABG) 2. History of Heart Surgery 3. History of Hemorrhoidectomy 4. History of Surgery Of Male Genitalia Vasectomy  Current Meds 1. Aspirin 325 MG Oral Tablet;  Therapy: (Recorded:06Jul2016) to Recorded 2. Losartan Potassium 100 MG Oral Tablet;  Therapy: (Recorded:22Aug2016) to Recorded 3. Metoprolol Tartrate 25 MG Oral Tablet;  Therapy: (Recorded:06Jul2016) to Recorded 4. Simvastatin 40 MG Oral Tablet;  Therapy: (Recorded:06Jul2016) to Recorded 5. Tylenol TABS;  Therapy: (Recorded:06Jul2016) to Recorded  Allergies Medication  1. codeine  Family History Problems  1. Family history of Leukemia, chronic : Mother  Social History Problems  1. Denied: History of Alcohol use 2. Caffeine use (F15.90)   1 glass every 2 days 3. Former smoker (Z87.891)   smoked 1/2 ppd for 9 years, quit in 1984 4. Married 5. Number of children   3 daughters 6. Occupation   funeral director  Review of Systems Genitourinary, constitutional, skin, eye, otolaryngeal,  hematologic/lymphatic, cardiovascular, pulmonary, endocrine, musculoskeletal, gastrointestinal, neurological and psychiatric system(s) were reviewed and pertinent findings if present are noted and are otherwise negative.  Gastrointestinal: nausea and flank pain.    Vitals Vital Signs [Data Includes: Last 1 Day]  Recorded: 22Aug2016 09:54AM  Blood Pressure: 108 / 71 Temperature: 97.9 F Heart Rate: 61  Physical Exam Constitutional: Well nourished and well developed . No acute distress.  Neck: The appearance of the neck is normal and no neck mass is present.  Pulmonary: No respiratory distress and normal respiratory rhythm and effort.  Cardiovascular: Heart rate and rhythm are normal . No peripheral edema.  Abdomen: No masses are palpated. Moderate tenderness in the LLQ is present. No CVA tenderness. No hepatosplenomegaly noted.    Results/Data Urine [Data Includes: Last 1 Day]   22Aug2016  COLOR YELLOW   APPEARANCE CLEAR   SPECIFIC GRAVITY 1.025   pH 6.0   GLUCOSE NEGATIVE   BILIRUBIN NEGATIVE   KETONE NEGATIVE   BLOOD 3+   PROTEIN TRACE   NITRITE NEGATIVE   LEUKOCYTE ESTERASE NEGATIVE   SQUAMOUS EPITHELIAL/HPF 0-5 HPF  WBC 0-5 WBC/HPF  RBC 40-60 RBC/HPF  BACTERIA NONE SEEN HPF  CRYSTALS NONE SEEN HPF  CASTS NONE SEEN LPF  Yeast NONE SEEN HPF   The following images/tracing/specimen were independently visualized:  I have reviewed his ER CT and KUB films and reports.  The following clinical lab reports were reviewed:  UA reviewed.    Assessment Assessed  1. Calculus of left ureter (N20.1)  He has a symptomatic 8mm left proximal stone with pain.   Plan Calculus of left ureter  1. Administer: Ketorolac   Tromethamine 60 MG/2ML Intramuscular Solution; INJECT 60  MG  Intramuscular; To Be Done: 22Aug2016 2. Follow-up Schedule Surgery Office  Follow-up  Status: Hold For - Appointment   Requested for: 22Aug2016 Health Maintenance  3. UA With REFLEX; [Do Not Release];  Status:Resulted - Requires Verification;   Done:  22Aug2016 09:41AM  Toradol 60mg IM given today.  I am going to set him up for cystoscopy with possible ureteroscopy and laser with ureteral stent insertion later today.  I have reviewed the risk of bleeding, infection, ureteral injury, need for secondary procedures, thrombotic events and anesthetic complications.   Discussion/Summary CC: Dr. Michael [name].    

## 2014-09-09 NOTE — Brief Op Note (Signed)
09/09/2014  9:46 AM  PATIENT:  [name] [name]  59 y.o. male  PRE-OPERATIVE DIAGNOSIS:  LEFT UPJ STONE   POST-OPERATIVE DIAGNOSIS:  LEFT UPJ STONE   PROCEDURE:  Procedure(s): CYSTOSCOPY,LEFT URETEROSCOPY AND STENT PLACEMENT (Left) WITH HOLMIUM LASER  (Left)  SURGEON:  Surgeon(s) and Role:    * Irine Seal, MD - Primary  PHYSICIAN ASSISTANT:   ASSISTANTS: none   ANESTHESIA:   general  EBL:  Total I/O In: 600 [I.V.:600] Out: -   BLOOD ADMINISTERED:none  DRAINS: 6 x 26 JJ stent   LOCAL MEDICATIONS USED:  NONE  SPECIMEN:  Source of Specimen:  left renal stone fragments  DISPOSITION OF SPECIMEN:  to family to bring to the office  COUNTS:  YES  TOURNIQUET:  * No tourniquets in log *  DICTATION: .Other Dictation: Dictation Number [id number]  PLAN OF CARE: Discharge to home after PACU  PATIENT DISPOSITION:  PACU - hemodynamically stable.   Delay start of Pharmacological VTE agent (>24hrs) due to surgical blood loss or risk of bleeding: not applicable

## 2014-09-09 NOTE — Interval H&P Note (Signed)
History and Physical Interval Note: He had a tight UPJ on his initial procedure and returns now for definitive stone removal.   09/09/2014 8:50 AM  [name] [name]  has presented today for surgery, with the diagnosis of LEFT UPJ STONE   The various methods of treatment have been discussed with the patient and family. After consideration of risks, benefits and other options for treatment, the patient has consented to  Procedure(s): LEFT URETEROSCOPY AND STENT PLACEMENT (Left) WITH HOLMIUM LASER  (Left) as a surgical intervention .  The patient's history has been reviewed, patient examined, no change in status, stable for surgery.  I have reviewed the patient's chart and labs.  Questions were answered to the patient's satisfaction.     Timohty Renbarger J

## 2014-09-09 NOTE — Anesthesia Postprocedure Evaluation (Signed)
  Anesthesia Post-op Note  Patient: [name] [name]  Procedure(s) Performed: Procedure(s) (LRB): CYSTOSCOPY,LEFT URETEROSCOPY AND STENT PLACEMENT (Left) WITH HOLMIUM LASER  (Left)  Patient Location: PACU  Anesthesia Type: General  Level of Consciousness: awake and alert   Airway and Oxygen Therapy: Patient Spontanous Breathing  Post-op Pain: mild  Post-op Assessment: Post-op Vital signs reviewed, Patient's Cardiovascular Status Stable, Respiratory Function Stable, Patent Airway and No signs of Nausea or vomiting  Last Vitals:  Filed Vitals:   09/09/14 1030  BP: 165/99  Pulse: 55  Temp:   Resp: 15    Post-op Vital Signs: stable   Complications: No apparent anesthesia complications

## 2014-09-10 ENCOUNTER — Encounter (HOSPITAL_COMMUNITY): Payer: Self-pay | Admitting: Urology

## 2014-09-10 NOTE — Op Note (Signed)
[name] [name], [name] [name]             ACCOUNT NO.:  [id number]  MEDICAL RECORD NO.:  [id number]  LOCATION:  [facility]                         FACILITY:  [facility]  PHYSICIAN:  Marshall Cork. Jeffie Pollock, M.D.    DATE OF BIRTH:  February 22, 1955  DATE OF PROCEDURE:  09/09/2014 DATE OF DISCHARGE:  09/09/2014                              OPERATIVE REPORT   PROCEDURES: 1. Cystoscopy with removal of double-J stent. 2. Left ureteroscopy with holmium laser lithotripsy and stone     extraction. 3. Insertion of left double-J stent.  PREOPERATIVE DIAGNOSIS:  Left ureterovesical junction stone.  POSTOPERATIVE DIAGNOSIS:  Left ureterovesical junction stone.  SURGEON:  Marshall Cork. Jeffie Pollock, M.D.  ANESTHESIA:  General.  SPECIMEN:  Stone.  BLOOD LOSS:  None.  DRAINS:  A 6-French x 26-cm Polaris double-J stent with string.  COMPLICATIONS:  None.  INDICATIONS:  [name] [name] is a 59 year old African American male, who has a history of a left UPJ stone.  He previously underwent attempted ureteroscopy with stent placement due to a tight UPJ.  The procedure was not completed, at that time, he returns now for definitive stone management.  FINDINGS OF PROCEDURE:  He was taken to the operating room where general anesthetic was induced.  He was given Cipro.  He was placed in lithotomy position and was fitted with PAS hose.  His perineum and genitalia were prepped with Betadine solution, he was draped in usual sterile fashion.  Cystoscopy was performed using the 23-French scope and 30-degree lens. Examination revealed a normal urethra.  The external sphincter was intact.  The prostatic urethra was short with bilobar hyperplasia with minimal obstruction.  Examination of the bladder revealed mild trabeculation.  No tumors or stones were noted.  The right ureteral orifice was unremarkable.  The left ureteral orifice had some edema with a Contour stent at the meatus.  The stent was grasped with a grasping forceps and pulled the  urethral meatus and a straight Sensor guidewire was passed to the kidney under fluoroscopic guidance.  The cystoscope was then removed and a 38-cm digital access sheath 12/14 was passed over the wire to just below the kidney.  The wire and inner core were removed.  The dual-lumen digital flexible scope was then passed through the sheath into the proximal ureter.  There was some mucosal inflammation from the prior procedure, but I was able to easily advance the scope into the kidney where the calices were inspected.  The stone was visible fluoroscopically and identified in the midpole calyx.  The stone was then engaged with a 200-micron laser fiber, set on 0.8 watts and 10 hertz.  The stone was broken into three manageable fragments and the fragments were then removed using a NGage basket.  The first two fragments came through the sheath.  The third fragment was just slightly too big to come through the sheath, but I was able to remove the stone and the sheath along with ureteroscope in combination without any compromise of the ureter.  Once the stone and sheath had been removed, the ureteroscope was reinserted and advanced to the kidney.  Inspection of the proximal ureter did reveal inflammation from the prior dilation and the current instrumentation.  Further inspection of the kidney and all caliceal systems revealed no additional stone fragments.  A guidewire was then advanced through the scope to the kidney and the scope was then removed. The cystoscope was reinserted over the wire and a 6-French 26-cm Polaris stent was passed over the wire to the kidney under fluoroscopic guidance.  The wire was removed leaving a good coil in the kidney and the loops in the bladder.  The cystoscope was removed leaving the stent string exiting the urethra.  Final position checked with fluoroscopy revealed the stent in good position.  The string was attached to the patient's penis.  He was taken down  from lithotomy position.  His anesthetic was reversed.  He was moved to the recovery room in stable condition.  There were no complications.     Marshall Cork. Jeffie Pollock, M.D.     JJW/MEDQ  D:  09/09/2014  T:  09/10/2014  Job:  [id number]

## 2014-09-11 NOTE — Anesthesia Postprocedure Evaluation (Signed)
Anesthesia Post Note  Patient: [name] [name]  Procedure(s) Performed: Procedure(s) (LRB): CYSTOSCOPY WITH RETROGRADE PYELOGRAM, URETEROSCOPY AND STENT PLACEMENT (Left)  Anesthesia type: General  Patient location: PACU  Post pain: Pain level controlled and Adequate analgesia  Post assessment: Post-op Vital signs reviewed, Patient's Cardiovascular Status Stable, Respiratory Function Stable, Patent Airway and Pain level controlled  Last Vitals:  Filed Vitals:   09/01/14 2100  BP: 145/83  Pulse: 61  Temp: 36.8 C  Resp: 16    Post vital signs: Reviewed and stable  Level of consciousness: awake, alert  and oriented  Complications: No apparent anesthesia complications

## 2014-09-14 ENCOUNTER — Other Ambulatory Visit: Payer: Self-pay | Admitting: Internal Medicine

## 2014-12-25 ENCOUNTER — Encounter: Payer: Self-pay | Admitting: Gastroenterology

## 2015-01-15 NOTE — Progress Notes (Signed)
Patient ID: [name] [name], male   DOB: December 14, 1955, 60 y.o.   MRN: [id number]   60 y.o. seen for f/u CAD  S/P CABG 10/19/10 [name] [name] F/U CAD  SURGICAL PROCEDURE: Coronary artery bypass grafting x4 with the left  internal mammary to the left anterior descending coronary artery,  reverse saphenous vein graft to the second diagonal coronary artery,  reverse saphenous vein graft to the first obtuse marginal, reverse  saphenous vein graft to the posterior descending coronary artery with  right leg endovein harvesting.    Duplex 02/27/14  Plaque no stenosis f/u again 02/2016     Left back/hip arthritis slowing him down BP is up Compliant with meds Needs new nitro Diuretic added last visit and BP improved Reviewed Home readings and much better  Has two new lab pups since November.  Discussed walking them and using recumbent bicycle Has pain in left hip and told him glucosamine ok  Sees Dr Melvyn Novas as his primary    ROS: Denies fever, malais, weight loss, blurry vision, decreased visual acuity, cough, sputum, SOB, hemoptysis, pleuritic pain, palpitaitons, heartburn, abdominal pain, melena, lower extremity edema, claudication, or rash.  All other systems reviewed and negative  General: Affect appropriate Healthy:  appears stated age 84: normal Neck supple with no adenopathy JVP normal left  bruits no thyromegaly Lungs clear with no wheezing and good diaphragmatic motion Heart:  S1/S2 no murmur, no rub, gallop or click PMI normal Abdomen: benighn, BS positve, no tenderness, no AAA no bruit.  No HSM or HJR Distal pulses intact with no bruits No edema Neuro non-focal Skin warm and dry No muscular weakness   Current Outpatient Prescriptions  Medication Sig Dispense Refill  . acetaminophen (TYLENOL) 500 MG tablet Take 1,000 mg by mouth every 6 (six) hours as needed for mild pain or moderate pain.     Marland Kitchen aspirin 325 MG EC tablet Take 325 mg by mouth daily.      . ciprofloxacin (CIPRO) 500  MG tablet Take 500 mg by mouth 2 (two) times daily.    . Glucosamine 500 MG CAPS Take 2 capsules by mouth daily.    . metoprolol tartrate (LOPRESSOR) 25 MG tablet TAKE 1 TABLET (25 MG TOTAL) BY MOUTH 2 (TWO) TIMES DAILY. 60 tablet 11  . ondansetron (ZOFRAN ODT) 8 MG disintegrating tablet Take 1 tablet (8 mg total) by mouth every 8 (eight) hours as needed for nausea or vomiting. 20 tablet 0  . oxyCODONE-acetaminophen (PERCOCET/ROXICET) 5-325 MG per tablet Take 1-2 tablets by mouth every 4 (four) hours as needed for severe pain. 15 tablet 0  . phenazopyridine (PYRIDIUM) 200 MG tablet Take 1 tablet (200 mg total) by mouth 3 (three) times daily as needed for pain. 15 tablet 1  . simvastatin (ZOCOR) 40 MG tablet TAKE 1 TABLET BY MOUTH AT BEDTIME 30 tablet 11  . losartan-hydrochlorothiazide (HYZAAR) 100-25 MG tablet Take 1 tablet by mouth daily. 30 tablet 11  . nitroGLYCERIN (NITROSTAT) 0.4 MG SL tablet Place 1 tablet (0.4 mg total) under the tongue every 5 (five) minutes as needed for chest pain. 25 tablet 1   No current facility-administered medications for this visit.    Allergies  Codeine  Electrocardiogram:  01/02/12  SR rate 56 nonspecific inferolateral T wave changes  11/14/12 SR rate 47 RSR' same nonspecific ST changes 02/10/14  SR rate 67 nonspecific ST/T wave changes   Assessment and Plan CAD:  Stable no angina CABG 2012  HTN:  Much improved with diuretic  Hematuria has f/u appt with Dr Jeffie Pollock  PSA normal Chol:  Cholesterol is at goal.  Continue current dose of statin and diet Rx.  No myalgias or side effects.  F/U  LFT's in 6 months. Lab Results  Component Value Date   LDLCALC 90 05/23/2014   F/U with me 6 months  Jenkins Rouge

## 2015-01-16 ENCOUNTER — Encounter: Payer: Self-pay | Admitting: Cardiovascular Disease

## 2015-01-16 ENCOUNTER — Ambulatory Visit (INDEPENDENT_AMBULATORY_CARE_PROVIDER_SITE_OTHER): Payer: 59 | Admitting: Cardiovascular Disease

## 2015-01-16 VITALS — BP 120/70 | HR 63 | Ht 68.0 in | Wt 195.4 lb

## 2015-01-16 DIAGNOSIS — I6523 Occlusion and stenosis of bilateral carotid arteries: Secondary | ICD-10-CM | POA: Diagnosis not present

## 2015-01-16 DIAGNOSIS — I10 Essential (primary) hypertension: Secondary | ICD-10-CM

## 2015-01-16 MED ORDER — NITROGLYCERIN 0.4 MG SL SUBL: 0.4000 mg | SUBLINGUAL_TABLET | SUBLINGUAL | Status: DC | PRN

## 2015-01-16 MED ORDER — LOSARTAN POTASSIUM-HCTZ 100-25 MG PO TABS: 1.0000 | ORAL_TABLET | Freq: Every day | ORAL | Status: DC

## 2015-01-16 NOTE — Patient Instructions (Addendum)

## 2015-03-13 ENCOUNTER — Encounter: Payer: Self-pay | Admitting: Gastroenterology

## 2015-03-20 ENCOUNTER — Ambulatory Visit (INDEPENDENT_AMBULATORY_CARE_PROVIDER_SITE_OTHER): Payer: 59 | Admitting: Sports Medicine

## 2015-03-20 ENCOUNTER — Encounter: Payer: Self-pay | Admitting: Sports Medicine

## 2015-03-20 VITALS — BP 129/75 | HR 53 | Ht 67.0 in | Wt 190.0 lb

## 2015-03-20 DIAGNOSIS — M25552 Pain in left hip: Secondary | ICD-10-CM

## 2015-03-20 NOTE — Progress Notes (Signed)
   Subjective:    Patient ID: [name] [name], male    DOB: [birth year], 60 y.o.   MRN: [id number]  HPI chief complaint: Left hip pain  Patient comes in today with returning left hip pain. He was last seen in the office in July 2015 and x-rays of the left hip at that time showed advanced left hip DJD. His symptoms were tolerable up until about 8 months ago. His pain started to become more constant at that time. He localizes most of his pain to the lateral hip but it will radiate into the groin and down the leg. He describes it as an aching type of pain that is worse with activity. Improves at rest. No numbness or tingling. He recently underwent kidney stone removal and as part of the workup for that procedure, a CT scan of his pelvis was done. This is available for my review. Patient is ready to discuss definitive treatment.  Past medical history reviewed Medications reviewed Allergies reviewed     Review of Systems    as above Objective:   Physical Exam  Well-developed, well-nourished. No acute distress. Awake alert and oriented 3. Vital signs reviewed  Left hip: Patient does have some loss of passive internal and external rotation when compared to the uninvolved right hip. Reproducible pain with resisted hip flexion as well. No real tenderness to palpation over the greater trochanteric bursa. Negative straight leg raise. Neurologically intact distally.  X-rays of the left hip from 2015 are reviewed. CT scan of his pelvis done recently is also reviewed. CT scan shows progression of left hip DJD seen on the x-ray in 2015.      Assessment & Plan:   Left hip pain secondary to DJD  Definitive treatment is a total hip arthroplasty. I will refer the patient to Dr. Ninfa Linden to discuss this further. We also discussed the possibility of an intra-articular cortisone injection but I'm not optimistic that he will get long-term benefit from this. I will defer further workup and treatment of his  left hip DJD to the discretion of Dr. Ninfa Linden and the patient will follow-up with me as needed.

## 2015-04-23 ENCOUNTER — Ambulatory Visit: Payer: 59

## 2015-04-23 VITALS — Ht 68.0 in | Wt 194.0 lb

## 2015-04-23 DIAGNOSIS — Z860100 Personal history of colon polyps, unspecified: Secondary | ICD-10-CM

## 2015-04-23 NOTE — Progress Notes (Signed)
No allergies to eggs or soy No home oxygen No diet meds No past problems with anesthesia  Has email and internet; refused emmi

## 2015-05-04 ENCOUNTER — Telehealth: Payer: Self-pay | Admitting: Gastroenterology

## 2015-05-04 DIAGNOSIS — Z86010 Personal history of colonic polyps: Secondary | ICD-10-CM

## 2015-05-04 MED ORDER — NA SULFATE-K SULFATE-MG SULF 17.5-3.13-1.6 GM/177ML PO SOLN: ORAL | Status: DC

## 2015-05-04 NOTE — Telephone Encounter (Signed)
[facility] to [facility]. Called and spoke with patient, he is aware prep should be at his pharmacy. Explained to patient to call us back if prep not at [facility].

## 2015-05-04 NOTE — Telephone Encounter (Signed)
Patient is needing his prep sent to his [facility] on [address].  Procedure is Wednesday.   Please call when sent so he can go pick it up

## 2015-05-06 ENCOUNTER — Ambulatory Visit (AMBULATORY_SURGERY_CENTER): Payer: 59 | Admitting: Gastroenterology

## 2015-05-06 ENCOUNTER — Encounter: Payer: Self-pay | Admitting: Gastroenterology

## 2015-05-06 VITALS — BP 105/81 | HR 56 | Temp 96.9°F | Resp 12 | Ht 68.0 in | Wt 194.0 lb

## 2015-05-06 DIAGNOSIS — Z86010 Personal history of colonic polyps: Secondary | ICD-10-CM | POA: Diagnosis present

## 2015-05-06 DIAGNOSIS — D128 Benign neoplasm of rectum: Secondary | ICD-10-CM

## 2015-05-06 DIAGNOSIS — D122 Benign neoplasm of ascending colon: Secondary | ICD-10-CM | POA: Diagnosis not present

## 2015-05-06 DIAGNOSIS — K621 Rectal polyp: Secondary | ICD-10-CM

## 2015-05-06 DIAGNOSIS — D129 Benign neoplasm of anus and anal canal: Secondary | ICD-10-CM

## 2015-05-06 MED ORDER — SODIUM CHLORIDE 0.9 % IV SOLN: 500.0000 mL | INTRAVENOUS | Status: DC

## 2015-05-06 NOTE — Progress Notes (Signed)
To pacu vss patent aw reprot to rn 

## 2015-05-06 NOTE — Patient Instructions (Signed)
Colon polyps x 2 removed today, and diverticulosis seen. Handouts given on polyps,diverticulosis.  Result letter in your mail in 2-3- weeks.  Resume current medications. Call us with any questions or concerns. Thank you!  YOU HAD AN ENDOSCOPIC PROCEDURE TODAY AT [facility]:   Refer to the procedure report that was given to you for any specific questions about what was found during the examination.  If the procedure report does not answer your questions, please call your gastroenterologist to clarify.  If you requested that your care partner not be given the details of your procedure findings, then the procedure report has been included in a sealed envelope for you to review at your convenience later.  YOU SHOULD EXPECT: Some feelings of bloating in the abdomen. Passage of more gas than usual.  Walking can help get rid of the air that was put into your GI tract during the procedure and reduce the bloating. If you had a lower endoscopy (such as a colonoscopy or flexible sigmoidoscopy) you may notice spotting of blood in your stool or on the toilet paper. If you underwent a bowel prep for your procedure, you may not have a normal bowel movement for a few days.  Please Note:  You might notice some irritation and congestion in your nose or some drainage.  This is from the oxygen used during your procedure.  There is no need for concern and it should clear up in a day or so.  SYMPTOMS TO REPORT IMMEDIATELY:   Following lower endoscopy (colonoscopy or flexible sigmoidoscopy):  Excessive amounts of blood in the stool  Significant tenderness or worsening of abdominal pains  Swelling of the abdomen that is new, acute  Fever of 100F or higher  For urgent or emergent issues, a gastroenterologist can be reached at any hour by calling [phone].   DIET: Your first meal following the procedure should be a small meal and then it is ok to progress to your normal diet. Heavy or fried  foods are harder to digest and may make you feel nauseous or bloated.  Likewise, meals heavy in dairy and vegetables can increase bloating.  Drink plenty of fluids but you should avoid alcoholic beverages for 24 hours.  ACTIVITY:  You should plan to take it easy for the rest of today and you should NOT DRIVE or use heavy machinery until tomorrow (because of the sedation medicines used during the test).    FOLLOW UP: Our staff will call the number listed on your records the next business day following your procedure to check on you and address any questions or concerns that you may have regarding the information given to you following your procedure. If we do not reach you, we will leave a message.  However, if you are feeling well and you are not experiencing any problems, there is no need to return our call.  We will assume that you have returned to your regular daily activities without incident.  If any biopsies were taken you will be contacted by phone or by letter within the next 1-3 weeks.  Please call us at [phone] if you have not heard about the biopsies in 3 weeks.    SIGNATURES/CONFIDENTIALITY: You and/or your care partner have signed paperwork which will be entered into your electronic medical record.  These signatures attest to the fact that that the information above on your After Visit Summary has been reviewed and is understood.  Full responsibility of the confidentiality  of this discharge information lies with you and/or your care-partner.

## 2015-05-06 NOTE — Op Note (Signed)
[name] Procedure Date: 05/06/2015 8:39 AM MRN: [id number] Endoscopist: Milus Banister , MD Age: 60 Date of Birth: 05-Apr-1955 Gender: Male Procedure:                Colonoscopy Indications:              High risk colon cancer surveillance: Personal                            history of colonic polyps (two sub CM adenomatous                            polyps removed during colonoscopy 2011, Dr.                            Sharlett Iles) Medicines:                Monitored Anesthesia Care Procedure:                Pre-Anesthesia Assessment:                           - Prior to the procedure, a History and Physical                            was performed, and patient medications and                            allergies were reviewed. The patient's tolerance of                            previous anesthesia was also reviewed. The risks                            and benefits of the procedure and the sedation                            options and risks were discussed with the patient.                            All questions were answered, and informed consent                            was obtained. Prior Anticoagulants: The patient has                            taken no previous anticoagulant or antiplatelet                            agents. ASA Grade Assessment: II - A patient with                            mild systemic disease. After reviewing the risks  and benefits, the patient was deemed in                            satisfactory condition to undergo the procedure.                           After obtaining informed consent, the colonoscope                            was passed under direct vision. Throughout the                            procedure, the patient's blood pressure, pulse, and                            oxygen saturations were monitored continuously. The                            Model CF-HQ190L  [id number]) scope was introduced                            through the anus and advanced to the the cecum,                            identified by appendiceal orifice and ileocecal                            valve. The colonoscopy was performed without                            difficulty. The patient tolerated the procedure                            well. The quality of the bowel preparation was                            good. The ileocecal valve, appendiceal orifice, and                            rectum were photographed. Scope In: 8:54:25 AM Scope Out: 9:11:10 AM Scope Withdrawal Time: 0 hours 13 minutes 18 seconds  Total Procedure Duration: 0 hours 16 minutes 45 seconds  Findings:                 Two sessile polyps were found in the rectum and                            ascending colon. The polyps were 3 to 5 mm in size.                            These polyps were removed with a cold snare.                            Resection and retrieval were complete.  Multiple small and large-mouthed diverticula were                            found in the entire colon.                           The exam was otherwise without abnormality on                            direct and retroflexion views. Complications:            No immediate complications. Estimated blood loss:                            None. Estimated Blood Loss:     Estimated blood loss: none. Impression:               - Two 3 to 5 mm polyps in the rectum and in the                            ascending colon, removed with a cold snare.                            Resected and retrieved.                           - Diverticulosis in the entire examined colon.                           - The examination was otherwise normal on direct                            and retroflexion views. Recommendation:           - Patient has a contact number available for                            emergencies. The  signs and symptoms of potential                            delayed complications were discussed with the                            patient. Return to normal activities tomorrow.                            Written discharge instructions were provided to the                            patient.                           - Resume previous diet.                           - Continue present medications.  You will receive a letter within 2-3 weeks with the                            pathology results and my final recommendations.                           If the polyp(s) is proven to be 'pre-cancerous' on                            pathology, you will need repeat colonoscopy in 5                            years without need for colon cancer screening by                            any method prior to then (including stool testing). Milus Banister, MD 05/06/2015 9:15:09 AM This report has been signed electronically.

## 2015-05-06 NOTE — Progress Notes (Signed)
Called to room to assist during endoscopic procedure.  Patient ID and intended procedure confirmed with present staff. Received instructions for my participation in the procedure from the performing physician.  

## 2015-05-07 ENCOUNTER — Telehealth: Payer: Self-pay

## 2015-05-07 NOTE — Telephone Encounter (Signed)
  Follow up Call-  Call back number 05/06/2015  Post procedure Call Back phone  # [phone]  Permission to leave phone message Yes     Patient questions:  Do you have a fever, pain , or abdominal swelling? No. Pain Score  0 *  Have you tolerated food without any problems? Yes.    Have you been able to return to your normal activities? Yes.    Do you have any questions about your discharge instructions: Diet   No. Medications  No. Follow up visit  No.  Do you have questions or concerns about your Care? No.  Actions: * If pain score is 4 or above: No action needed, pain <4.

## 2015-05-12 ENCOUNTER — Encounter: Payer: Self-pay | Admitting: Gastroenterology

## 2015-05-19 ENCOUNTER — Other Ambulatory Visit: Payer: Self-pay | Admitting: Physician Assistant

## 2015-05-22 ENCOUNTER — Encounter (HOSPITAL_COMMUNITY)
Admission: RE | Admit: 2015-05-22 | Discharge: 2015-05-22 | Disposition: A | Discharge status: Home / Self Care | Payer: 59 | Source: Ambulatory Visit | Attending: Orthopaedic Surgery | Admitting: Orthopaedic Surgery

## 2015-05-22 ENCOUNTER — Encounter (HOSPITAL_COMMUNITY): Payer: Self-pay

## 2015-05-22 DIAGNOSIS — I10 Essential (primary) hypertension: Secondary | ICD-10-CM | POA: Insufficient documentation

## 2015-05-22 HISTORY — DX: Personal history of other medical treatment: Z92.89

## 2015-05-22 HISTORY — DX: Personal history of urinary calculi: Z87.442

## 2015-05-22 HISTORY — DX: Unspecified osteoarthritis, unspecified site: M19.90

## 2015-05-22 HISTORY — DX: Acute embolism and thrombosis of unspecified vein: I82.90

## 2015-05-22 LAB — SURGICAL PCR SCREEN
MRSA, PCR: NEGATIVE
Staphylococcus aureus: NEGATIVE

## 2015-05-22 LAB — BASIC METABOLIC PANEL
Anion gap: 8 (ref 5–15)
BUN: 25 mg/dL — ABNORMAL HIGH (ref 6–20)
CO2: 29 mmol/L (ref 22–32)
Calcium: 9.6 mg/dL (ref 8.9–10.3)
Chloride: 104 mmol/L (ref 101–111)
Creatinine, Ser: 0.95 mg/dL (ref 0.61–1.24)
GFR calc Af Amer: >60 mL/min (ref >60)
GFR calc non Af Amer: >60 mL/min (ref >60)
Glucose, Bld: 117 mg/dL — ABNORMAL HIGH (ref 65–99)
Potassium: 4.1 mmol/L (ref 3.5–5.1)
Sodium: 141 mmol/L (ref 135–145)

## 2015-05-22 LAB — CBC
HCT: 37.1 % — ABNORMAL LOW (ref 39.0–52.0)
Hemoglobin: 13 g/dL (ref 13.0–17.0)
MCH: 30.6 pg (ref 26.0–34.0)
MCHC: 35 g/dL (ref 30.0–36.0)
MCV: 87.3 fL (ref 78.0–100.0)
Platelets: 224 10*3/uL (ref 150–400)
RBC: 4.25 MIL/uL (ref 4.22–5.81)
RDW: 12.8 % (ref 11.5–15.5)
WBC: 5.5 10*3/uL (ref 4.0–10.5)

## 2015-05-22 LAB — PROTIME-INR
INR: 1.02 (ref 0.00–1.49)
Prothrombin Time: 13.6 seconds (ref 11.6–15.2)

## 2015-05-22 LAB — ABO/RH: ABO/RH(D): AB POS

## 2015-05-22 NOTE — Progress Notes (Signed)
BMP results in epic per PAT visit 05/22/2015 sent to Dr Kathrynn Speed

## 2015-05-22 NOTE — Patient Instructions (Signed)
KENNISON CAMPI  05/22/2015   Your procedure is scheduled on: Friday May 29, 2015  Report to [facility] Main  Entrance take [facility]  elevators to 3rd floor to  [place] at 8:00 AM.  Call this number if you have problems the morning of surgery [phone]   Remember: ONLY 1 PERSON MAY GO WITH YOU TO SHORT STAY TO GET  READY MORNING OF [place].  Do not eat food or drink liquids :After Midnight.     Take these medicines the morning of surgery with A SIP OF WATER: Metoprolol                               You may not have any metal on your body including hair pins and              piercings  Do not wear jewelry, lotions, powders or colognes, deodorant             Men may shave face and neck.   Do not bring valuables to the hospital. [place].  Contacts, dentures or bridgework may not be worn into surgery.  Leave suitcase in the car. After surgery it may be brought to your room.              Please read over the following fact sheets you were given:MRSA INFORMATION SHEET; INCENTIVE SPIROMETER  _____________________________________________________________________             [facility] - Preparing for Surgery Before surgery, you can play an important role.  Because skin is not sterile, your skin needs to be as free of germs as possible.  You can reduce the number of germs on your skin by washing with CHG (chlorahexidine gluconate) soap before surgery.  CHG is an antiseptic cleaner which kills germs and bonds with the skin to continue killing germs even after washing. Please DO NOT use if you have an allergy to CHG or antibacterial soaps.  If your skin becomes reddened/irritated stop using the CHG and inform your nurse when you arrive at Short Stay. Do not shave (including legs and underarms) for at least 48 hours prior to the first CHG shower.  You may shave your face/neck. Please follow these  instructions carefully:  1.  Shower with CHG Soap the night before surgery and the  morning of Surgery.  2.  If you choose to wash your hair, wash your hair first as usual with your  normal  shampoo.  3.  After you shampoo, rinse your hair and body thoroughly to remove the  shampoo.                           4.  Use CHG as you would any other liquid soap.  You can apply chg directly  to the skin and wash                       Gently with a scrungie or clean washcloth.  5.  Apply the CHG Soap to your body ONLY FROM THE NECK DOWN.   Do not use on face/ open  Wound or open sores. Avoid contact with eyes, ears mouth and genitals (private parts).                       Wash face,  Genitals (private parts) with your normal soap.             6.  Wash thoroughly, paying special attention to the area where your surgery  will be performed.  7.  Thoroughly rinse your body with warm water from the neck down.  8.  DO NOT shower/wash with your normal soap after using and rinsing off  the CHG Soap.                9.  Pat yourself dry with a clean towel.            10.  Wear clean pajamas.            11.  Place clean sheets on your bed the night of your first shower and do not  sleep with pets. Day of Surgery : Do not apply any lotions/deodorants the morning of surgery.  Please wear clean clothes to the hospital/surgery center.  FAILURE TO FOLLOW THESE INSTRUCTIONS MAY RESULT IN THE CANCELLATION OF YOUR SURGERY PATIENT SIGNATURE_________________________________  NURSE SIGNATURE__________________________________  ________________________________________________________________________   Adam Phenix  An incentive spirometer is a tool that can help keep your lungs clear and active. This tool measures how well you are filling your lungs with each breath. Taking long deep breaths may help reverse or decrease the chance of developing breathing (pulmonary) problems (especially  infection) following:  A long period of time when you are unable to move or be active. BEFORE THE PROCEDURE   If the spirometer includes an indicator to show your best effort, your nurse or respiratory therapist will set it to a desired goal.  If possible, sit up straight or lean slightly forward. Try not to slouch.  Hold the incentive spirometer in an upright position. INSTRUCTIONS FOR USE  1. Sit on the edge of your bed if possible, or sit up as far as you can in bed or on a chair. 2. Hold the incentive spirometer in an upright position. 3. Breathe out normally. 4. Place the mouthpiece in your mouth and seal your lips tightly around it. 5. Breathe in slowly and as deeply as possible, raising the piston or the ball toward the top of the column. 6. Hold your breath for 3-5 seconds or for as long as possible. Allow the piston or ball to fall to the bottom of the column. 7. Remove the mouthpiece from your mouth and breathe out normally. 8. Rest for a few seconds and repeat Steps 1 through 7 at least 10 times every 1-2 hours when you are awake. Take your time and take a few normal breaths between deep breaths. 9. The spirometer may include an indicator to show your best effort. Use the indicator as a goal to work toward during each repetition. 10. After each set of 10 deep breaths, practice coughing to be sure your lungs are clear. If you have an incision (the cut made at the time of surgery), support your incision when coughing by placing a pillow or rolled up towels firmly against it. Once you are able to get out of bed, walk around indoors and cough well. You may stop using the incentive spirometer when instructed by your caregiver.  RISKS AND COMPLICATIONS  Take your time so you do not get  dizzy or light-headed.  If you are in pain, you may need to take or ask for pain medication before doing incentive spirometry. It is harder to take a deep breath if you are having pain. AFTER  USE  Rest and breathe slowly and easily.  It can be helpful to keep track of a log of your progress. Your caregiver can provide you with a simple table to help with this. If you are using the spirometer at home, follow these instructions: [place] IF:   You are having difficultly using the spirometer.  You have trouble using the spirometer as often as instructed.  Your pain medication is not giving enough relief while using the spirometer.  You develop fever of 100.5 F (38.1 C) or higher. SEEK IMMEDIATE MEDICAL CARE IF:   You cough up bloody sputum that had not been present before.  You develop fever of 102 F (38.9 C) or greater.  You develop worsening pain at or near the incision site. MAKE SURE YOU:   Understand these instructions.  Will watch your condition.  Will get help right away if you are not doing well or get worse. Document Released: 05/09/2006 Document Revised: 03/21/2011 Document Reviewed: 07/10/2006 [facility] Patient Information 2014 [place], [place].   ________________________________________________________________________

## 2015-05-25 ENCOUNTER — Ambulatory Visit (INDEPENDENT_AMBULATORY_CARE_PROVIDER_SITE_OTHER): Payer: 59 | Admitting: Internal Medicine

## 2015-05-25 ENCOUNTER — Ambulatory Visit (INDEPENDENT_AMBULATORY_CARE_PROVIDER_SITE_OTHER)
Admission: RE | Admit: 2015-05-25 | Discharge: 2015-05-25 | Disposition: A | Discharge status: Home / Self Care | Payer: 59 | Source: Ambulatory Visit | Attending: Internal Medicine | Admitting: Internal Medicine

## 2015-05-25 ENCOUNTER — Telehealth: Payer: Self-pay | Admitting: Cardiovascular Disease

## 2015-05-25 ENCOUNTER — Other Ambulatory Visit (INDEPENDENT_AMBULATORY_CARE_PROVIDER_SITE_OTHER): Payer: 59

## 2015-05-25 ENCOUNTER — Encounter: Payer: Self-pay | Admitting: Internal Medicine

## 2015-05-25 VITALS — BP 120/82 | HR 60 | Ht 69.0 in | Wt 196.0 lb

## 2015-05-25 DIAGNOSIS — Z0000 Encounter for general adult medical examination without abnormal findings: Secondary | ICD-10-CM

## 2015-05-25 DIAGNOSIS — E7800 Pure hypercholesterolemia, unspecified: Secondary | ICD-10-CM

## 2015-05-25 DIAGNOSIS — I10 Essential (primary) hypertension: Secondary | ICD-10-CM

## 2015-05-25 DIAGNOSIS — Z23 Encounter for immunization: Secondary | ICD-10-CM

## 2015-05-25 LAB — LIPID PANEL
Cholesterol: 151 mg/dL (ref 0–200)
HDL: 31.5 mg/dL — ABNORMAL LOW (ref >39.00)
LDL Cholesterol: 99 mg/dL (ref 0–99)
NonHDL: 119.75
Total CHOL/HDL Ratio: 5
Triglycerides: 103 mg/dL (ref 0.0–149.0)
VLDL: 20.6 mg/dL (ref 0.0–40.0)

## 2015-05-25 LAB — URINALYSIS
Bilirubin Urine: NEGATIVE
Hgb urine dipstick: NEGATIVE
Ketones, ur: NEGATIVE
Leukocytes, UA: NEGATIVE
Nitrite: NEGATIVE
Specific Gravity, Urine: 1.02 (ref 1.000–1.030)
Total Protein, Urine: NEGATIVE
Urine Glucose: NEGATIVE
Urobilinogen, UA: 0.2 (ref 0.0–1.0)
pH: 6.5 (ref 5.0–8.0)

## 2015-05-25 LAB — HEPATIC FUNCTION PANEL
ALT: 31 U/L (ref 0–53)
AST: 25 U/L (ref 0–37)
Albumin: 4.7 g/dL (ref 3.5–5.2)
Alkaline Phosphatase: 38 U/L — ABNORMAL LOW (ref 39–117)
Bilirubin, Direct: 0.1 mg/dL (ref 0.0–0.3)
Total Bilirubin: 0.6 mg/dL (ref 0.2–1.2)
Total Protein: 7.1 g/dL (ref 6.0–8.3)

## 2015-05-25 LAB — TSH: TSH: 1.08 u[IU]/mL (ref 0.35–4.50)

## 2015-05-25 LAB — PSA: PSA: 0.48 ng/mL (ref 0.10–4.00)

## 2015-05-25 NOTE — Progress Notes (Signed)
Quick Note:  Spoke with pt and notified of results per Dr. [name]. Pt verbalized understanding and denied any questions.  ______ 

## 2015-05-25 NOTE — Patient Instructions (Addendum)
Please remember to go to the lab and x-ray department downstairs for your tests - we will call you with the results when they are available.    Pneumovax today   Call Dr Kyla Balzarine office to see if he can clear you for hip surgery   We need to see you yearly - sooner as needed - for CPX on return

## 2015-05-25 NOTE — Telephone Encounter (Signed)
New message    The pt saw PCP this morning and making sure everything is ok to surgery on Friday at [place] [facility].   Request for surgical clearance:  What type of surgery is being performed? Hip surgery on left side per pt  When is this surgery scheduled? On Friday May 19 th @10  am  Are there any medications that need to be held prior to surgery and how long? Per pt uncertain pt states on Aspirin 350 mg po once a day  Name of physician performing surgery? Dr. Ninfa Linden at [facility] orthopedic  What is your office phone and fax number? [phone] uncertian   Per pt the PCP suggested the pt call the cardiologist to make sure everything is ok.

## 2015-05-25 NOTE — Progress Notes (Signed)
Patient ID: [name] [name], male   DOB: August 17, 1955   MRN: [id number]   Brief patient profile:  38 yobm quit smoking in early 1980's with hypertension hyperlipidemia and atypical chest pain with a left heart catheter showing 60% obstruction of the right-sided branches in the 1999 and a negative stress study in May of 2007> cabg required 10/2010    08/12/2010 ov/Cristen Bredeson  Cc new onset cp comes and goes x 1 week so went to ER July 30th with neg w/u and much less cp since er.  Location = Left of sternum only, aching but   No relation to activity, lasts no more than a few seconds, not noticeable supine or sleeping.  Does ok with exercise with no pains. rec Cardiac eval    DATE OF ADMISSION: 10/18/2010  DATE OF DISCHARGE: 10/24/2010  DISCHARGE SUMMARY  PRIMARY ADMITTING DIAGNOSIS: Chest pain.  ADDITIONAL/DISCHARGE DIAGNOSES:  1. Coronary artery disease.  2. New-onset angina.  3. Hypertension.  4. Hyperlipidemia.  5. Remote history of tobacco abuse.  6. Postoperative acute blood loss anemia.  PROCEDURES PERFORMED:  1. Cardiac catheterization.  2. Coronary artery bypass grafting x4 (left internal mammary artery to  the LAD, saphenous vein graft to the second diagonal, saphenous  vein graft to the first obtuse marginal, saphenous vein graft to  the posterior descending).  3. Endoscopic vein harvest, right leg  04/04/2011 f/u ov/Giulio Bertino cc new L lat localized hip pain worse standing up, 3-4 weeks comes and goes, not tried nsaids, maybe worse toward end of day rec Try advil up to 3 with meals to see if helps hip pain and if not better within a week call for referral to an orthopedic surgeon> referred   01/02/2012 f/u ov/Kian Ottaviano f/u hbp, hyperlipidemia complicated by CAD s/p cabg 10/19/10. No cp, tia or claudication symptoms >>no changed   05/23/2014 f/u ov/Tarita Deshmukh re: cpx  Chief Complaint  Patient presents with  . Follow-up    Pt states overall doing well.  No new co's today.  Not able to ex due to  L hip and no f/u with Dr [name] yet,  advil x 2 at hs  Some decrease libido rec No change rx   05/25/2015  f/u ov/Derhonda Eastlick re: cpx   Chief Complaint  Patient presents with  . Annual Exam    Pt is fasting. He is doing well and no co's today. He is scheduled to have total left replacement 05/29/15.   not able to ex due to hip/ rx tyl arthritis not on nsaids   No ex cp/ tia/ claudication symptoms / Not limited by breathing from desired activities  But by hip pain   No   chronic cough or  chest tightness, subjective wheeze overt sinus or hb symptoms. No unusual exp hx or h/o childhood pna/ asthma or knowledge of premature birth.  Sleeping ok without nocturnal  or early am exacerbation  of respiratory  c/o's or need for noct saba. Also denies any obvious fluctuation of symptoms with weather or environmental changes or other aggravating or alleviating factors except as outlined above   Current Medications, Allergies, Complete Past Medical History, Past Surgical History, Family History, and Social History were reviewed in [employer] record.  ROS  The following are not active complaints unless bolded sore throat, dysphagia, dental problems, itching, sneezing,  nasal congestion or excess/ purulent secretions, ear ache,   fever, chills, sweats, unintended wt loss, pleuritic or exertional cp, hemoptysis,  orthopnea pnd or leg  swelling, presyncope, palpitations, heartburn, abdominal pain, anorexia, nausea, vomiting, diarrhea  or change in bowel or urinary habits, change in stools or urine, dysuria,hematuria,  rash, arthralgias L hip , visual complaints, headache, numbness weakness or ataxia or problems with walking or coordination,  change in mood/affect or memory.             Past Medical History from [facility]: HYPERTENSION, BENIGN ESSENTIAL, CONTROLLED (ICD-401.1)  OBESITY  - Target wt = 190 for BMI < 30  Colon  polyps....................................................................................................[name]  - colonoscopy 4/04, letter sent 05/15/07 reviewed with pt July 17, 2008 and August 03, 2009 > referred back to GI  CHEST PAIN (ICD-786.50)  - s/p cabg 10/19/2010 HYPERLIPIDEMIA  - target LDL less than 70 based on documented ischemic heart disease  HEALTH MAINTENANCE..........................................................................................[name]  - TD April 2009  - CPX  05/25/2015      Family History:  hypertension in mother and father no premature heart disease in any direct relatives nor stroke diabetes or cancer, except that his mother has developed leukemia  1 brother healthy  Neg prostate ca   Social History:  quit smoking about 1983 denies alcohol use. Works at a [employer] home  semi aerobic workouts                    Objective:   Physical Exam  Ambulatory healthy appearing black male in no acute distress/ vital signs reviewed   wt 201 November 20, 2007 > 205 April 18, 2008 > 184 01/03/2011 > 182 04/04/2011 > 07/04/2011  185  >189 09/19/2011 > 01/02/2012 194 >204 02/09/2012 >196 04/04/2012 > 04/25/2012 194 > 11/02/2012  193 > 01/19/2013  190 >  05/23/2014  193 > 05/25/2015 196   GEN: A/Ox3; pleasant , NAD  HEENT:  [place]/AT,  EACs-clear, TMs-wnl, NOSE-clear drainage  THROAT-clear, no lesions, no postnasal drip or exudate noted.   NECK:  Supple w/ fair ROM; no JVD; normal carotid impulses w/o bruits; no thyromegaly or nodules palpated; no lymphadenopathy.  RESP  Clear  P & A; w/o, wheezes/ rales/ or rhonchi.no accessory muscle use, no dullness to percussion  CARD:  RRR, no m/r/g  , no peripheral edema, pulses intact, no cyanosis or clubbing.  GI:   Soft & nt; nml bowel sounds; no organomegaly or masses detected.    Musco: Warm bil, no deformities or joint swelling noted. Limited L hip ext rotation     .  Neuro: alert, no focal deficits noted.   No path reflexes  Skin: Warm, no lesions or rashes  GU uncirc, nl testes, no IH  Rectal:  Very nl prostate, no nodules/ stool G neg        CXR PA and Lateral:   05/25/2015 :    I personally reviewed images and agree with radiology impression as follows:    No active cardiopulmonary disease.       Labs ordered/ reviewed:      Chemistry      Component Value Date/Time   NA 141 05/22/2015 0900   K 4.1 05/22/2015 0900   CL 104 05/22/2015 0900   CO2 29 05/22/2015 0900   BUN 25* 05/22/2015 0900   CREATININE 0.95 05/22/2015 0900      Component Value Date/Time   CALCIUM 9.6 05/22/2015 0900   ALKPHOS 38* 05/25/2015 0916   AST 25 05/25/2015 0916   ALT 31 05/25/2015 0916   BILITOT 0.6 05/25/2015 0916        Lab Results  Component Value  Date   WBC 5.5 05/22/2015   HGB 13.0 05/22/2015   HCT 37.1* 05/22/2015   MCV 87.3 05/22/2015   PLT 224 05/22/2015        Lab Results  Component Value Date   TSH 1.08 05/25/2015         Assessment:

## 2015-05-25 NOTE — Telephone Encounter (Signed)
Yes he is ok for surgery

## 2015-05-26 NOTE — Assessment & Plan Note (Signed)
Age 60 now so first pneuovax due/ otherwise all vaccinations up to date

## 2015-05-26 NOTE — Assessment & Plan Note (Signed)
-   Target LDL < 70 since has known CAD  Lab Results  Component Value Date   CHOL 151 05/25/2015   HDL 31.50* 05/25/2015   LDLCALC 99 05/25/2015   TRIG 103.0 05/25/2015   CHOLHDL 5 05/25/2015    Adequate control on present rx, reviewed > no change in rx needed  For now, will be able to add ex back to regimen p hip surgery > advised to contact Dr Johnsie Cancel re preop clearance as now 5 y out from cabg

## 2015-05-28 NOTE — Progress Notes (Signed)
Pt aware to arrive at [facility] short stay at 7:45 am on 05/29/2015.

## 2015-05-29 ENCOUNTER — Encounter (HOSPITAL_COMMUNITY)
Admission: RE | Disposition: A | Discharge status: Home Health | Payer: Self-pay | Source: Ambulatory Visit | Attending: Orthopaedic Surgery

## 2015-05-29 ENCOUNTER — Inpatient Hospital Stay (HOSPITAL_COMMUNITY): Payer: 59

## 2015-05-29 ENCOUNTER — Inpatient Hospital Stay (HOSPITAL_COMMUNITY): Payer: 59 | Admitting: Anesthesiology

## 2015-05-29 ENCOUNTER — Inpatient Hospital Stay (HOSPITAL_COMMUNITY)
Admission: RE | Admit: 2015-05-29 | Discharge: 2015-06-01 | DRG: 470 | Disposition: A | Discharge status: Home Health | Payer: 59 | Source: Ambulatory Visit | Attending: Orthopaedic Surgery | Admitting: Orthopaedic Surgery

## 2015-05-29 ENCOUNTER — Encounter (HOSPITAL_COMMUNITY): Payer: Self-pay | Admitting: *Deleted

## 2015-05-29 DIAGNOSIS — Z419 Encounter for procedure for purposes other than remedying health state, unspecified: Secondary | ICD-10-CM

## 2015-05-29 DIAGNOSIS — I10 Essential (primary) hypertension: Secondary | ICD-10-CM | POA: Diagnosis present

## 2015-05-29 DIAGNOSIS — Z96642 Presence of left artificial hip joint: Secondary | ICD-10-CM

## 2015-05-29 DIAGNOSIS — I2510 Atherosclerotic heart disease of native coronary artery without angina pectoris: Secondary | ICD-10-CM | POA: Diagnosis present

## 2015-05-29 DIAGNOSIS — E669 Obesity, unspecified: Secondary | ICD-10-CM | POA: Diagnosis present

## 2015-05-29 DIAGNOSIS — R509 Fever, unspecified: Secondary | ICD-10-CM

## 2015-05-29 DIAGNOSIS — M1612 Unilateral primary osteoarthritis, left hip: Secondary | ICD-10-CM | POA: Diagnosis present

## 2015-05-29 DIAGNOSIS — Z6828 Body mass index (BMI) 28.0-28.9, adult: Secondary | ICD-10-CM | POA: Diagnosis not present

## 2015-05-29 DIAGNOSIS — Z951 Presence of aortocoronary bypass graft: Secondary | ICD-10-CM | POA: Diagnosis not present

## 2015-05-29 DIAGNOSIS — M25552 Pain in left hip: Secondary | ICD-10-CM | POA: Diagnosis present

## 2015-05-29 DIAGNOSIS — Z87891 Personal history of nicotine dependence: Secondary | ICD-10-CM | POA: Diagnosis not present

## 2015-05-29 HISTORY — PX: TOTAL HIP ARTHROPLASTY: SHX124

## 2015-05-29 LAB — TYPE AND SCREEN
ABO/RH(D): AB POS
Antibody Screen: NEGATIVE

## 2015-05-29 SURGERY — ARTHROPLASTY, HIP, TOTAL, ANTERIOR APPROACH
Anesthesia: Spinal | Site: Hip | Laterality: Left

## 2015-05-29 MED ORDER — FENTANYL CITRATE (PF) 100 MCG/2ML IJ SOLN
INTRAMUSCULAR | Status: AC
  Filled 2015-05-29: qty 2

## 2015-05-29 MED ORDER — METOCLOPRAMIDE HCL 5 MG PO TABS: 5.0000 mg | ORAL_TABLET | Freq: Three times a day (TID) | ORAL | Status: DC | PRN

## 2015-05-29 MED ORDER — DIPHENHYDRAMINE HCL 12.5 MG/5ML PO ELIX: 12.5000 mg | ORAL_SOLUTION | ORAL | Status: DC | PRN

## 2015-05-29 MED ORDER — PROPOFOL 10 MG/ML IV BOLUS
INTRAVENOUS | Status: AC
  Filled 2015-05-29: qty 20

## 2015-05-29 MED ORDER — MIDAZOLAM HCL 5 MG/5ML IJ SOLN
INTRAMUSCULAR | Status: DC | PRN
  Administered 2015-05-29: 2 mg via INTRAVENOUS

## 2015-05-29 MED ORDER — CEFAZOLIN SODIUM-DEXTROSE 2-4 GM/100ML-% IV SOLN
2.0000 g | INTRAVENOUS | Status: AC
  Administered 2015-05-29: 2 g via INTRAVENOUS
  Filled 2015-05-29: qty 100

## 2015-05-29 MED ORDER — ALUM & MAG HYDROXIDE-SIMETH 200-200-20 MG/5ML PO SUSP: 30.0000 mL | ORAL | Status: DC | PRN

## 2015-05-29 MED ORDER — SODIUM CHLORIDE 0.9 % IR SOLN
Status: DC | PRN
  Administered 2015-05-29: 1000 mL

## 2015-05-29 MED ORDER — FENTANYL CITRATE (PF) 100 MCG/2ML IJ SOLN
INTRAMUSCULAR | Status: DC | PRN
  Administered 2015-05-29: 100 ug via INTRAVENOUS

## 2015-05-29 MED ORDER — PHENOL 1.4 % MT LIQD: 1.0000 | OROMUCOSAL | Status: DC | PRN

## 2015-05-29 MED ORDER — CHLORHEXIDINE GLUCONATE 4 % EX LIQD: 60.0000 mL | Freq: Once | CUTANEOUS | Status: DC

## 2015-05-29 MED ORDER — ONDANSETRON HCL 4 MG PO TABS
4.0000 mg | ORAL_TABLET | Freq: Four times a day (QID) | ORAL | Status: DC | PRN
  Administered 2015-05-31: 4 mg via ORAL
  Filled 2015-05-29: qty 1

## 2015-05-29 MED ORDER — CEFAZOLIN SODIUM-DEXTROSE 2-4 GM/100ML-% IV SOLN
INTRAVENOUS | Status: AC
  Filled 2015-05-29: qty 100

## 2015-05-29 MED ORDER — MENTHOL 3 MG MT LOZG: 1.0000 | LOZENGE | OROMUCOSAL | Status: DC | PRN

## 2015-05-29 MED ORDER — PROPOFOL 500 MG/50ML IV EMUL
INTRAVENOUS | Status: DC | PRN
  Administered 2015-05-29: 100 ug/kg/min via INTRAVENOUS

## 2015-05-29 MED ORDER — HYDROMORPHONE HCL 1 MG/ML IJ SOLN
INTRAMUSCULAR | Status: AC
  Filled 2015-05-29: qty 1

## 2015-05-29 MED ORDER — MIDAZOLAM HCL 2 MG/2ML IJ SOLN
INTRAMUSCULAR | Status: AC
  Filled 2015-05-29: qty 2

## 2015-05-29 MED ORDER — ACETAMINOPHEN 325 MG PO TABS
650.0000 mg | ORAL_TABLET | Freq: Four times a day (QID) | ORAL | Status: DC | PRN
  Administered 2015-05-29 – 2015-06-01 (×6): 650 mg via ORAL
  Filled 2015-05-29 (×6): qty 2

## 2015-05-29 MED ORDER — LOSARTAN POTASSIUM 50 MG PO TABS
100.0000 mg | ORAL_TABLET | Freq: Every day | ORAL | Status: DC
  Administered 2015-05-29 – 2015-05-31 (×3): 100 mg via ORAL
  Filled 2015-05-29 (×3): qty 2

## 2015-05-29 MED ORDER — METOPROLOL TARTRATE 25 MG PO TABS
25.0000 mg | ORAL_TABLET | Freq: Two times a day (BID) | ORAL | Status: DC
  Administered 2015-05-29 – 2015-06-01 (×6): 25 mg via ORAL
  Filled 2015-05-29 (×6): qty 1

## 2015-05-29 MED ORDER — CEFAZOLIN SODIUM 1-5 GM-% IV SOLN
1.0000 g | Freq: Four times a day (QID) | INTRAVENOUS | Status: AC
  Administered 2015-05-29 (×2): 1 g via INTRAVENOUS
  Filled 2015-05-29 (×2): qty 50

## 2015-05-29 MED ORDER — ACETAMINOPHEN 650 MG RE SUPP: 650.0000 mg | Freq: Four times a day (QID) | RECTAL | Status: DC | PRN

## 2015-05-29 MED ORDER — BUPIVACAINE HCL (PF) 0.5 % IJ SOLN
INTRAMUSCULAR | Status: DC | PRN
  Administered 2015-05-29: 15 mg via INTRATHECAL

## 2015-05-29 MED ORDER — STERILE WATER FOR IRRIGATION IR SOLN
Status: DC | PRN
  Administered 2015-05-29: 3000 mL

## 2015-05-29 MED ORDER — HYDROMORPHONE HCL 1 MG/ML IJ SOLN
1.0000 mg | INTRAMUSCULAR | Status: DC | PRN
Start: 2015-05-29 — End: 2015-06-01

## 2015-05-29 MED ORDER — METHOCARBAMOL 1000 MG/10ML IJ SOLN
500.0000 mg | Freq: Four times a day (QID) | INTRAVENOUS | Status: DC | PRN
  Administered 2015-05-29: 500 mg via INTRAVENOUS
  Filled 2015-05-29: qty 5
  Filled 2015-05-29: qty 550

## 2015-05-29 MED ORDER — SODIUM CHLORIDE 0.9 % IV SOLN
INTRAVENOUS | Status: DC
  Administered 2015-05-29: 15:00:00 via INTRAVENOUS

## 2015-05-29 MED ORDER — 0.9 % SODIUM CHLORIDE (POUR BTL) OPTIME
TOPICAL | Status: DC | PRN
  Administered 2015-05-29: 1000 mL

## 2015-05-29 MED ORDER — ONDANSETRON HCL 4 MG/2ML IJ SOLN
INTRAMUSCULAR | Status: AC
  Filled 2015-05-29: qty 2

## 2015-05-29 MED ORDER — METHOCARBAMOL 500 MG PO TABS
500.0000 mg | ORAL_TABLET | Freq: Four times a day (QID) | ORAL | Status: DC | PRN
  Administered 2015-05-31 (×2): 500 mg via ORAL
  Filled 2015-05-29 (×2): qty 1

## 2015-05-29 MED ORDER — ONDANSETRON HCL 4 MG/2ML IJ SOLN: 4.0000 mg | Freq: Four times a day (QID) | INTRAMUSCULAR | Status: DC | PRN

## 2015-05-29 MED ORDER — DOCUSATE SODIUM 100 MG PO CAPS
100.0000 mg | ORAL_CAPSULE | Freq: Two times a day (BID) | ORAL | Status: DC
  Administered 2015-05-29 – 2015-06-01 (×6): 100 mg via ORAL
  Filled 2015-05-29 (×6): qty 1

## 2015-05-29 MED ORDER — LOSARTAN POTASSIUM-HCTZ 100-25 MG PO TABS: 1.0000 | ORAL_TABLET | Freq: Every day | ORAL | Status: DC

## 2015-05-29 MED ORDER — SIMVASTATIN 20 MG PO TABS
40.0000 mg | ORAL_TABLET | Freq: Every day | ORAL | Status: DC
  Administered 2015-05-29 – 2015-05-31 (×3): 40 mg via ORAL
  Filled 2015-05-29 (×4): qty 2

## 2015-05-29 MED ORDER — OXYCODONE HCL 5 MG PO TABS
5.0000 mg | ORAL_TABLET | ORAL | Status: DC | PRN
  Administered 2015-05-29 – 2015-06-01 (×10): 10 mg via ORAL
  Filled 2015-05-29 (×10): qty 2

## 2015-05-29 MED ORDER — KETOROLAC TROMETHAMINE 15 MG/ML IJ SOLN
7.5000 mg | Freq: Four times a day (QID) | INTRAMUSCULAR | Status: AC
  Administered 2015-05-29 – 2015-05-30 (×4): 7.5 mg via INTRAVENOUS
  Filled 2015-05-29 (×4): qty 1

## 2015-05-29 MED ORDER — HYDROCHLOROTHIAZIDE 25 MG PO TABS
25.0000 mg | ORAL_TABLET | Freq: Every day | ORAL | Status: DC
  Administered 2015-05-29 – 2015-05-31 (×3): 25 mg via ORAL
  Filled 2015-05-29 (×3): qty 1

## 2015-05-29 MED ORDER — ASPIRIN EC 325 MG PO TBEC
325.0000 mg | DELAYED_RELEASE_TABLET | Freq: Two times a day (BID) | ORAL | Status: DC
  Administered 2015-05-30 – 2015-06-01 (×5): 325 mg via ORAL
  Filled 2015-05-29 (×5): qty 1

## 2015-05-29 MED ORDER — BUPIVACAINE HCL (PF) 0.5 % IJ SOLN
INTRAMUSCULAR | Status: AC
  Filled 2015-05-29: qty 30

## 2015-05-29 MED ORDER — PROPOFOL 10 MG/ML IV BOLUS
INTRAVENOUS | Status: AC
  Filled 2015-05-29: qty 40

## 2015-05-29 MED ORDER — LACTATED RINGERS IV SOLN
INTRAVENOUS | Status: DC
  Administered 2015-05-29 (×2): via INTRAVENOUS
  Administered 2015-05-29: 1000 mL via INTRAVENOUS

## 2015-05-29 MED ORDER — FENTANYL CITRATE (PF) 100 MCG/2ML IJ SOLN
25.0000 ug | INTRAMUSCULAR | Status: DC | PRN
  Administered 2015-05-29: 50 ug via INTRAVENOUS

## 2015-05-29 MED ORDER — TRANEXAMIC ACID 1000 MG/10ML IV SOLN
1000.0000 mg | INTRAVENOUS | Status: AC
  Administered 2015-05-29: 1000 mg via INTRAVENOUS
  Filled 2015-05-29: qty 10

## 2015-05-29 MED ORDER — METOCLOPRAMIDE HCL 5 MG/ML IJ SOLN: 5.0000 mg | Freq: Three times a day (TID) | INTRAMUSCULAR | Status: DC | PRN

## 2015-05-29 SURGICAL SUPPLY — 36 items
APL SKNCLS STERI-STRIP NONHPOA (GAUZE/BANDAGES/DRESSINGS) ×1
BAG SPEC THK2 15X12 ZIP CLS (MISCELLANEOUS)
BAG ZIPLOCK 12X15 (MISCELLANEOUS) IMPLANT
BENZOIN TINCTURE PRP APPL 2/3 (GAUZE/BANDAGES/DRESSINGS) ×1 IMPLANT
BLADE SAW SGTL 18X1.27X75 (BLADE) ×2 IMPLANT
CAPT HIP TOTAL 2 ×1 IMPLANT
CELLS DAT CNTRL 66122 CELL SVR (MISCELLANEOUS) ×1 IMPLANT
CLOTH BEACON ORANGE TIMEOUT ST (SAFETY) ×2 IMPLANT
DRAPE STERI IOBAN 125X83 (DRAPES) ×2 IMPLANT
DRAPE U-SHAPE 47X51 STRL (DRAPES) ×4 IMPLANT
DRSG AQUACEL AG ADV 3.5X10 (GAUZE/BANDAGES/DRESSINGS) ×2 IMPLANT
DURAPREP 26ML APPLICATOR (WOUND CARE) ×2 IMPLANT
ELECT REM PT RETURN 9FT ADLT (ELECTROSURGICAL) ×2
ELECTRODE REM PT RTRN 9FT ADLT (ELECTROSURGICAL) ×1 IMPLANT
GAUZE XEROFORM 1X8 LF (GAUZE/BANDAGES/DRESSINGS) IMPLANT
GLOVE BIO SURGEON STRL SZ7.5 (GLOVE) ×2 IMPLANT
GLOVE BIOGEL PI IND STRL 8 (GLOVE) ×2 IMPLANT
GLOVE BIOGEL PI INDICATOR 8 (GLOVE) ×2
GLOVE ECLIPSE 8.0 STRL XLNG CF (GLOVE) ×2 IMPLANT
GOWN STRL REUS W/TWL XL LVL3 (GOWN DISPOSABLE) ×4 IMPLANT
HANDPIECE INTERPULSE COAX TIP (DISPOSABLE) ×2
HOLDER FOLEY CATH W/STRAP (MISCELLANEOUS) ×2 IMPLANT
PACK ANTERIOR HIP CUSTOM (KITS) ×2 IMPLANT
RETRACTOR WND ALEXIS 18 MED (MISCELLANEOUS) ×1 IMPLANT
RTRCTR WOUND ALEXIS 18CM MED (MISCELLANEOUS) ×2
SET HNDPC FAN SPRY TIP SCT (DISPOSABLE) ×1 IMPLANT
STAPLER VISISTAT 35W (STAPLE) IMPLANT
STRIP CLOSURE SKIN 1/2X4 (GAUZE/BANDAGES/DRESSINGS) ×1 IMPLANT
SUT ETHIBOND NAB CT1 #1 30IN (SUTURE) ×2 IMPLANT
SUT MNCRL AB 4-0 PS2 18 (SUTURE) ×1 IMPLANT
SUT VIC AB 0 CT1 36 (SUTURE) ×2 IMPLANT
SUT VIC AB 1 CT1 36 (SUTURE) ×2 IMPLANT
SUT VIC AB 2-0 CT1 27 (SUTURE) ×4
SUT VIC AB 2-0 CT1 TAPERPNT 27 (SUTURE) ×2 IMPLANT
TRAY FOLEY W/METER SILVER 14FR (SET/KITS/TRAYS/PACK) ×1 IMPLANT
TRAY FOLEY W/METER SILVER 16FR (SET/KITS/TRAYS/PACK) ×2 IMPLANT

## 2015-05-29 NOTE — Progress Notes (Signed)
Pneumonia shot on Monday at doctors office. Stuffy nose since then. Took OTC cold medication

## 2015-05-29 NOTE — H&P (Signed)
TOTAL HIP ADMISSION H&P  Patient is admitted for left total hip arthroplasty.  Subjective:  Chief Complaint: left hip pain  HPI: [name] [name], 60 y.o. male, has a history of pain and functional disability in the left hip(s) due to arthritis and patient has failed non-surgical conservative treatments for greater than 12 weeks to include NSAID's and/or analgesics, flexibility and strengthening excercises, weight reduction as appropriate and activity modification.  Onset of symptoms was gradual starting 2 years ago with gradually worsening course since that time.The patient noted no past surgery on the left hip(s).  Patient currently rates pain in the left hip at 10 out of 10 with activity. Patient has night pain, worsening of pain with activity and weight bearing, pain that interfers with activities of daily living and pain with passive range of motion. Patient has evidence of subchondral sclerosis, periarticular osteophytes and joint space narrowing by imaging studies. This condition presents safety issues increasing the risk of falls.  There is no current active infection.  Patient Active Problem List   Diagnosis Date Noted  . Osteoarthritis of left hip 05/29/2015  . Hematuria, microscopic 05/25/2014  . Acute upper respiratory infections of unspecified site 01/18/2013  . Left carotid bruit 11/14/2012  . Hot flashes 11/04/2012  . Health care maintenance 04/25/2012  . Left groin pain 04/04/2012  . Hip pain, left 04/06/2011  . Hx of CABG 11/05/2010  . CAD (coronary artery disease)   . COLONIC POLYPS 05/26/2007  . OBESITY 05/26/2007  . Essential hypertension 05/26/2007  . Chronic ischemic heart disease 05/26/2007  . HEMORRHOIDS, INTERNAL 05/26/2007  . ALLERGIC RHINITIS, CHRONIC 05/26/2007  . CONSTIPATION, CHRONIC 05/26/2007  . GASTROINTESTINAL HEMORRHAGE, HX OF 05/26/2007  . TOBACCO USE, QUIT 05/26/2007  . HYPERCHOLESTEROLEMIA 05/09/2007  . BLADDER NECK OBSTRUCTION 05/09/2007    Past Medical History  Diagnosis Date  . Essential hypertension, benign   . Obesity   . Colon polyps   . Chest pain   . Hyperlipemia   . CAD (coronary artery disease)   . Tobacco abuse   . Heart murmur   . Complication of anesthesia     hard to wake up  . Headache     migraines as teenager  . Blood clot in vein     superficial / right leg following CABG  . History of kidney stones   . Arthritis   . History of blood transfusion     Past Surgical History  Procedure Laterality Date  . Stapled hemorrhoidopexy.  10/19/2006  . Coronary artery bypass grafting x4  10/21/2010  . Vasectomy  1986  . Circumcision  1986  . Cystoscopy with retrograde pyelogram, ureteroscopy and stent placement Left 09/01/2014    Procedure: [place], URETEROSCOPY AND STENT PLACEMENT;  Surgeon: Irine Seal, MD;  Location: [facility] ORS;  Service: Urology;  Laterality: Left;  . Hemorrhoid surgery    . Cystoscopy with ureteroscopy and stent placement Left 09/09/2014    Procedure: CYSTOSCOPY,LEFT URETEROSCOPY AND STENT PLACEMENT;  Surgeon: Irine Seal, MD;  Location: [facility] ORS;  Service: Urology;  Laterality: Left;  . Holmium laser application Left [id number]    Procedure: WITH HOLMIUM LASER ;  Surgeon: Irine Seal, MD;  Location: [facility] ORS;  Service: Urology;  Laterality: Left;  . Coronary artery bypass graft    . Cardiac catheterization      No prescriptions prior to admission   Allergies  Allergen Reactions  . Codeine Other (See Comments)    migraines    Social  History  Substance Use Topics  . Smoking status: Former Smoker -- 0.50 packs/day for 7 years    Types: Cigarettes    Quit date: 01/10/1981  . Smokeless tobacco: Never Used  . Alcohol Use: No    Family History  Problem Relation Age of Onset  . Hypertension Father   . Leukemia Mother   . Hypertension Mother   . Colon cancer Neg Hx      Review of Systems  Musculoskeletal: Positive for joint pain.  All other systems  reviewed and are negative.   Objective:  Physical Exam  Constitutional: He is oriented to person, place, and time. He appears well-developed and well-nourished.  HENT:  Head: Normocephalic and atraumatic.  Eyes: EOM are normal. Pupils are equal, round, and reactive to light.  Neck: Normal range of motion. Neck supple.  Cardiovascular: Normal rate and regular rhythm.   Respiratory: Effort normal and breath sounds normal.  GI: Soft. Bowel sounds are normal.  Musculoskeletal:       Left hip: He exhibits decreased range of motion, decreased strength, tenderness and bony tenderness.  Neurological: He is alert and oriented to person, place, and time.  Skin: Skin is warm and dry.  Psychiatric: He has a normal mood and affect.    Vital signs in last 24 hours:    Labs:   Estimated body mass index is 29.50 kg/(m^2) as calculated from the following:   Height as of 05/06/15: 5\' 8"  (1.727 m).   Weight as of 05/06/15: 87.998 kg (194 lb).   Imaging Review Plain radiographs demonstrate severe degenerative joint disease of the left hip(s). The bone quality appears to be excellent for age and reported activity level.  Assessment/Plan:  End stage arthritis, left hip(s)  The patient history, physical examination, clinical judgement of the provider and imaging studies are consistent with end stage degenerative joint disease of the left hip(s) and total hip arthroplasty is deemed medically necessary. The treatment options including medical management, injection therapy, arthroscopy and arthroplasty were discussed at length. The risks and benefits of total hip arthroplasty were presented and reviewed. The risks due to aseptic loosening, infection, stiffness, dislocation/subluxation,  thromboembolic complications and other imponderables were discussed.  The patient acknowledged the explanation, agreed to proceed with the plan and consent was signed. Patient is being admitted for inpatient treatment for  surgery, pain control, PT, OT, prophylactic antibiotics, VTE prophylaxis, progressive ambulation and ADL's and discharge planning.The patient is planning to be discharged home with home health services

## 2015-05-29 NOTE — Brief Op Note (Signed)
05/29/2015  10:39 AM  PATIENT:  [name] [name]  60 y.o. male  PRE-OPERATIVE DIAGNOSIS:  severe osteoarthritis left hip  POST-OPERATIVE DIAGNOSIS:  severe osteoarthritis left hip  PROCEDURE:  Procedure(s): LEFT TOTAL HIP ARTHROPLASTY ANTERIOR APPROACH (Left)  SURGEON:  Surgeon(s) and Role:    * Mcarthur Rossetti, MD - Primary  PHYSICIAN ASSISTANT: Benita Stabile, PA-C  ANESTHESIA:   spinal  EBL:  Total I/O In: 2000 [I.V.:2000] Out: 400 [Urine:100; Blood:300]  COUNTS:  YES  TOURNIQUET:  * No tourniquets in log *  DICTATION: .Other Dictation: Dictation Number [id number]  PLAN OF CARE: Admit to inpatient   PATIENT DISPOSITION:  PACU - hemodynamically stable.   Delay start of Pharmacological VTE agent (>24hrs) due to surgical blood loss or risk of bleeding: no

## 2015-05-29 NOTE — Anesthesia Procedure Notes (Signed)
Spinal Patient location during procedure: OR Start time: 05/29/2015 9:20 AM End time: 05/29/2015 9:22 AM Staffing Resident/CRNA: Harle Stanford R Performed by: resident/CRNA  Preanesthetic Checklist Completed: patient identified, site marked, surgical consent, pre-op evaluation, timeout performed, IV checked, risks and benefits discussed and monitors and equipment checked Spinal Block Patient position: sitting Prep: Betadine Patient monitoring: heart rate, cardiac monitor, continuous pulse ox and blood pressure Approach: midline Location: L3-4 Injection technique: single-shot Needle Needle type: Sprotte  Needle gauge: 24 G Needle length: 10 cm Needle insertion depth: 7 cm Assessment Sensory level: T6 Additional Notes Timeout performed. Spinal kit date checked. SAB without difficulty

## 2015-05-29 NOTE — Anesthesia Preprocedure Evaluation (Addendum)
Anesthesia Evaluation  Patient identified by MRN, date of birth, ID band Patient awake    Reviewed: Allergy & Precautions, NPO status , Patient's Chart, lab work & pertinent test results  Airway Mallampati: II  TM Distance: >3 FB Neck ROM: Full    Dental   Pulmonary former smoker,    breath sounds clear to auscultation       Cardiovascular hypertension, + CAD   Rhythm:Regular Rate:Normal     Neuro/Psych    GI/Hepatic negative GI ROS, Neg liver ROS,   Endo/Other  negative endocrine ROS  Renal/GU negative Renal ROS     Musculoskeletal   Abdominal   Peds  Hematology   Anesthesia Other Findings   Reproductive/Obstetrics                            Anesthesia Physical Anesthesia Plan  ASA: III  Anesthesia Plan: Spinal   Post-op Pain Management:    Induction: Intravenous  Airway Management Planned: Simple Face Mask  Additional Equipment:   Intra-op Plan:   Post-operative Plan:   Informed Consent:   Dental advisory given  Plan Discussed with: CRNA and Anesthesiologist  Anesthesia Plan Comments:         Anesthesia Quick Evaluation

## 2015-05-29 NOTE — Transfer of Care (Signed)
Immediate Anesthesia Transfer of Care Note  Patient: [name]  Procedure(s) Performed: Procedure(s): LEFT TOTAL HIP ARTHROPLASTY ANTERIOR APPROACH (Left)  Patient Location: PACU  Anesthesia Type:Spinal  Level of Consciousness: sedated  Airway & Oxygen Therapy: Patient Spontanous Breathing and Patient connected to face mask oxygen  Post-op Assessment: Report given to RN and Post -op Vital signs reviewed and stable  Post vital signs: Reviewed and stable  Last Vitals:  Filed Vitals:   05/29/15 0755  BP: 163/97  Pulse: 58  Temp: 36.8 C  Resp: 16    Last Pain:  Filed Vitals:   05/29/15 0848  PainSc: 3       Patients Stated Pain Goal: 4 ([id number])  Complications: No apparent anesthesia complications

## 2015-05-29 NOTE — Anesthesia Postprocedure Evaluation (Signed)
Anesthesia Post Note  Patient: [name] [name]  Procedure(s) Performed: Procedure(s) (LRB): LEFT TOTAL HIP ARTHROPLASTY ANTERIOR APPROACH (Left)  Patient location during evaluation: PACU Anesthesia Type: Spinal Level of consciousness: awake Vital Signs Assessment: post-procedure vital signs reviewed and stable Respiratory status: spontaneous breathing Cardiovascular status: stable Anesthetic complications: no    Last Vitals:  Filed Vitals:   05/29/15 0755  BP: 163/97  Pulse: 58  Temp: 36.8 C  Resp: 16    Last Pain:  Filed Vitals:   05/29/15 1106  PainSc: 3                  EDWARDS,Guinevere Stephenson

## 2015-05-30 LAB — CBC
HCT: 29.8 % — ABNORMAL LOW (ref 39.0–52.0)
Hemoglobin: 10.1 g/dL — ABNORMAL LOW (ref 13.0–17.0)
MCH: 30.7 pg (ref 26.0–34.0)
MCHC: 33.9 g/dL (ref 30.0–36.0)
MCV: 90.6 fL (ref 78.0–100.0)
Platelets: 161 10*3/uL (ref 150–400)
RBC: 3.29 MIL/uL — ABNORMAL LOW (ref 4.22–5.81)
RDW: 13.6 % (ref 11.5–15.5)
WBC: 4.9 10*3/uL (ref 4.0–10.5)

## 2015-05-30 LAB — BASIC METABOLIC PANEL
Anion gap: 4 — ABNORMAL LOW (ref 5–15)
BUN: 15 mg/dL (ref 6–20)
CO2: 31 mmol/L (ref 22–32)
Calcium: 8.4 mg/dL — ABNORMAL LOW (ref 8.9–10.3)
Chloride: 104 mmol/L (ref 101–111)
Creatinine, Ser: 0.9 mg/dL (ref 0.61–1.24)
GFR calc Af Amer: >60 mL/min (ref >60)
GFR calc non Af Amer: >60 mL/min (ref >60)
Glucose, Bld: 109 mg/dL — ABNORMAL HIGH (ref 65–99)
Potassium: 3.7 mmol/L (ref 3.5–5.1)
Sodium: 139 mmol/L (ref 135–145)

## 2015-05-30 MED ORDER — LORATADINE 10 MG PO TABS
10.0000 mg | ORAL_TABLET | Freq: Every day | ORAL | Status: DC
  Administered 2015-05-30 – 2015-06-01 (×3): 10 mg via ORAL
  Filled 2015-05-30 (×3): qty 1

## 2015-05-30 MED ORDER — OXYCODONE-ACETAMINOPHEN 5-325 MG PO TABS: 1.0000 | ORAL_TABLET | ORAL | Status: DC | PRN

## 2015-05-30 MED ORDER — ASPIRIN 325 MG PO TBEC: 325.0000 mg | DELAYED_RELEASE_TABLET | Freq: Two times a day (BID) | ORAL | Status: DC

## 2015-05-30 MED ORDER — METHOCARBAMOL 500 MG PO TABS: 500.0000 mg | ORAL_TABLET | Freq: Four times a day (QID) | ORAL | Status: DC | PRN

## 2015-05-30 MED ORDER — GUAIFENESIN ER 600 MG PO TB12
600.0000 mg | ORAL_TABLET | Freq: Two times a day (BID) | ORAL | Status: DC | PRN
  Administered 2015-05-30: 600 mg via ORAL
  Filled 2015-05-30: qty 1

## 2015-05-30 NOTE — Evaluation (Signed)
Physical Therapy Evaluation Patient Details Name: [name] [name] MRN: [id number] DOB: 05/24/1955 Today's Date: 05/30/2015   History of Present Illness  Pt s/p L THR with hx of CAD and CABG  Clinical Impression  Pt s/p L THR presents with decreased L LE strength/ROM and post op pain limiting functional mobility.  Pt should progress well to dc home with family assist and HHPT follow up.    Follow Up Recommendations Home health PT    Equipment Recommendations  Rolling walker with 5" wheels    Recommendations for Other Services OT consult     Precautions / Restrictions Precautions Precautions: Fall Restrictions Weight Bearing Restrictions: No Other Position/Activity Restrictions: WBAT      Mobility  Bed Mobility Overal bed mobility: Needs Assistance Bed Mobility: Supine to Sit     Supine to sit: Min assist     General bed mobility comments: cues for sequence and use of R LE to self assist  Transfers Overall transfer level: Needs assistance Equipment used: Rolling walker (2 wheeled) Transfers: Sit to/from Stand Sit to Stand: Min assist;From elevated surface         General transfer comment: cues for LE management and use of UEs to self assist  Ambulation/Gait Ambulation/Gait assistance: Min assist Ambulation Distance (Feet): 123 Feet Assistive device: Rolling walker (2 wheeled) Gait Pattern/deviations: Step-to pattern;Step-through pattern;Decreased step length - right;Decreased step length - left;Shuffle;Trunk flexed Gait velocity: decr Gait velocity interpretation: Below normal speed for age/gender General Gait Details: cues for posture, position from RW and initial sequence  Stairs            Wheelchair Mobility    Modified Rankin (Stroke Patients Only)       Balance                                             Pertinent Vitals/Pain Pain Assessment: 0-10 Pain Score: 5  Pain Location: L hip Pain Descriptors / Indicators:  Aching;Sore Pain Intervention(s): Limited activity within patient's tolerance;Monitored during session;Premedicated before session;Ice applied    Home Living Family/patient expects to be discharged to:: Private residence Living Arrangements: Spouse/significant other Available Help at Discharge: Family Type of Home: House Home Access: Stairs to enter Entrance Stairs-Rails: Right Entrance Stairs-Number of Steps: 3 Home Layout: Two level Home Equipment: None      Prior Function Level of Independence: Independent               Hand Dominance        Extremity/Trunk Assessment   Upper Extremity Assessment: Overall WFL for tasks assessed           Lower Extremity Assessment: LLE deficits/detail   LLE Deficits / Details: Strength at hip 2+/5 with AAROM at hip to 80 flex and 15 abd  Cervical / Trunk Assessment: Normal  Communication   Communication: No difficulties  Cognition Arousal/Alertness: Awake/alert Behavior During Therapy: WFL for tasks assessed/performed Overall Cognitive Status: Within Functional Limits for tasks assessed                      General Comments      Exercises Total Joint Exercises Ankle Circles/Pumps: AROM;Both;15 reps;Supine Quad Sets: AROM;Both;10 reps;Supine Heel Slides: AAROM;Left;20 reps;Supine Hip ABduction/ADduction: AAROM;Left;15 reps;Supine      Assessment/Plan    PT Assessment Patient needs continued PT services  PT Diagnosis Difficulty walking  PT Problem List Decreased strength;Decreased range of motion;Decreased activity tolerance;Decreased mobility;Pain;Decreased knowledge of use of DME  PT Treatment Interventions DME instruction;Gait training;Stair training;Functional mobility training;Therapeutic activities;Therapeutic exercise;Patient/family education   PT Goals (Current goals can be found in the Care Plan section) Acute Rehab PT Goals Patient Stated Goal: Regain IND PT Goal Formulation: With patient Time  For Goal Achievement: 06/04/15 Potential to Achieve Goals: Good    Frequency 7X/week   Barriers to discharge        Co-evaluation               End of Session Equipment Utilized During Treatment: Gait belt Activity Tolerance: Patient tolerated treatment well Patient left: in chair;with call bell/phone within reach;with chair alarm set Nurse Communication: Mobility status         Time: [id number] PT Time Calculation (min) (ACUTE ONLY): 33 min   Charges:   PT Evaluation $PT Eval Low Complexity: 1 Procedure PT Treatments $Therapeutic Exercise: 8-22 mins   PT G Codes:        Mayreli Alden June 16, 2015, 12:53 PM

## 2015-05-30 NOTE — Progress Notes (Signed)
Subjective: 1 Day Post-Op Procedure(s) (LRB): LEFT TOTAL HIP ARTHROPLASTY ANTERIOR APPROACH (Left) Patient reports pain as moderate.  Sinus congestion this am.  Objective: Vital signs in last 24 hours: Temp:  [97.5 F (36.4 C)-102 F (38.9 C)] 99.6 F (37.6 C) (05/20 0430) Pulse Rate:  [44-106] 65 (05/20 0430) Resp:  [7-18] 16 (05/20 0430) BP: (110-156)/(58-115) 133/70 mmHg (05/20 0430) SpO2:  [98 %-100 %] 100 % (05/20 0430)  Intake/Output from previous day: 05/19 0701 - 05/20 0700 In: 4213.8 [P.O.:1080; I.V.:3133.8] Out: 2175 [Urine:1875; Blood:300] Intake/Output this shift:     Recent Labs  05/30/15 0401  HGB 10.1*    Recent Labs  05/30/15 0401  WBC 4.9  RBC 3.29*  HCT 29.8*  PLT 161    Recent Labs  05/30/15 0401  NA 139  K 3.7  CL 104  CO2 31  BUN 15  CREATININE 0.90  GLUCOSE 109*  CALCIUM 8.4*   No results for input(s): LABPT, INR in the last 72 hours.  Sensation intact distally Intact pulses distally Dorsiflexion/Plantar flexion intact Incision: scant drainage  Assessment/Plan: 1 Day Post-Op Procedure(s) (LRB): LEFT TOTAL HIP ARTHROPLASTY ANTERIOR APPROACH (Left) Up with therapy Discharge home with home health - tomorrow vs Monday  Zavia Pullen Y 05/30/2015, 10:02 AM

## 2015-05-30 NOTE — Progress Notes (Signed)
OT Cancellation Note  Patient Details Name: [name] [name] MRN: [id number] DOB: 13-Jun-1955   Cancelled Treatment:    Reason Eval/Treat Not Completed: Other (comment) -- On first attempt, pt getting lunch tray. Attempted later and had multiple visitors. OT will follow up at a later time.  Ernan Runkles A 05/30/2015, 1:48 PM

## 2015-05-30 NOTE — Progress Notes (Signed)
Physical Therapy Treatment Patient Details Name: WILFERD KAWALEC MRN: [id number] DOB: 20-May-1955 Today's Date: 06/23/15    History of Present Illness Pt s/p L THR with hx of CAD and CABG    PT Comments    Pt progressing well with mobility.  Follow Up Recommendations  Home health PT     Equipment Recommendations  Rolling walker with 5" wheels    Recommendations for Other Services OT consult     Precautions / Restrictions Precautions Precautions: Fall Restrictions Weight Bearing Restrictions: No Other Position/Activity Restrictions: WBAT    Mobility  Bed Mobility                  Transfers Overall transfer level: Needs assistance Equipment used: Rolling walker (2 wheeled) Transfers: Sit to/from Stand Sit to Stand: Min assist;From elevated surface         General transfer comment: cues for LE management and use of UEs to self assist  Ambulation/Gait Ambulation/Gait assistance: Min guard Ambulation Distance (Feet): 155 Feet Assistive device: Rolling walker (2 wheeled) Gait Pattern/deviations: Step-to pattern;Step-through pattern;Decreased step length - right;Shuffle;Trunk flexed;Decreased stance time - left Gait velocity: decr   General Gait Details: cues for posture, position from RW and initial sequence   Stairs            Wheelchair Mobility    Modified Rankin (Stroke Patients Only)       Balance                                    Cognition Arousal/Alertness: Awake/alert Behavior During Therapy: WFL for tasks assessed/performed Overall Cognitive Status: Within Functional Limits for tasks assessed                      Exercises      General Comments        Pertinent Vitals/Pain Pain Assessment: 0-10 Pain Score: 5  Pain Location: L hip Pain Descriptors / Indicators: Aching;Sore Pain Intervention(s): Limited activity within patient's tolerance;Monitored during session;Premedicated before session;Ice  applied    Home Living                      Prior Function            PT Goals (current goals can now be found in the care plan section) Acute Rehab PT Goals Patient Stated Goal: Regain IND PT Goal Formulation: With patient Time For Goal Achievement: 06/04/15 Potential to Achieve Goals: Good Progress towards PT goals: Progressing toward goals    Frequency  7X/week    PT Plan Current plan remains appropriate    Co-evaluation             End of Session   Activity Tolerance: Patient tolerated treatment well Patient left: in chair;with call bell/phone within reach;with chair alarm set     Time: [id number] PT Time Calculation (min) (ACUTE ONLY): 17 min  Charges:  $Gait Training: 8-22 mins                    G Codes:      Trayce Maino 06/23/2015, 4:25 PM

## 2015-05-30 NOTE — Op Note (Signed)
NAMECOLVIN, RAYHILL             ACCOUNT NO.:  [id number]  MEDICAL RECORD NO.:  [id number]  LOCATION:  [id number]                         FACILITY:  [facility]  PHYSICIAN:  Lind Guest. Ninfa Linden, M.D.DATE OF BIRTH:  1955/05/19  DATE OF PROCEDURE:  05/29/2015 DATE OF DISCHARGE:                              OPERATIVE REPORT   PREOPERATIVE DIAGNOSIS:  Severe osteoarthritis and primary degenerative joint disease, left hip.  POSTOPERATIVE DIAGNOSIS:  Severe osteoarthritis and primary degenerative joint disease, left hip.  PROCEDURE:  Left total hip arthroplasty through direct anterior approach.  IMPLANTS:  DePuy Sector Gription acetabular component size 54, size 36+ 0 polyethylene liner, size 12 Corail femoral component with standard offset, size 36+ 1.5 ceramic hip ball.  SURGEON:  Lind Guest. Ninfa Linden, MD.  ASSISTANT:  Erskine Emery, PA-C  ANESTHESIA:  Spinal.  ANTIBIOTICS:  2 g IV Ancef.  BLOOD LOSS:  300 mL.  COMPLICATIONS:  None.  INDICATIONS:  Mr. Winnie is a 60 year old gentleman with debilitating arthritis involving his left hip.  This pain has been worsening over 2 year period of time.  He has x-rays that show complete loss of his joint space of his left hip including the acetabular side, the femoral side are sclerotic and cystic changes as well as periarticular osteophytes. Given his daily pain, his decreased mobility, and decreased quality of life, he wished to proceed with a total hip arthroplasty.  I explained him the risk of acute blood loss anemia, nerve and vessel injury, fracture, infection, dislocation, DVT.  He understands our goals are decreased pain, improved mobility, and overall improved quality of life.  PROCEDURE DESCRIPTION:  After informed consent was obtained, appropriate left hip was marked.  He was brought to the operating room, where spinal anesthesia was obtained while he was on the stretcher.  He was placed in a supine position on the  stretcher.  A Foley catheter was placed.  Then, both feet had traction boots applied to them.  Next, he was placed supine on the Hana fracture table with perineal post in place and both legs in inline skeletal traction device with no traction applied.  His left operative hip was then prepped and draped with sterile DuraPrep and sterile drapes.  Time-out was called and he was identified as correct patient, correct left hip.  I then made an incision inferior and posterior to the anterior superior iliac spine and carried this obliquely down the leg.  We dissected down the tensor fascia lata muscle.  The tensor fascia was then divided longitudinally, so we could proceed with a direct anterior approach to the hip.  We identified and cauterized the lateral femoral circumflex vessels and then identified the hip capsule, opened up the hip capsule in an L-type format finding a moderate joint effusion and significant periarticular osteophytes.  We placed Cobra retractors within the joint capsule and made our femoral neck cut proximal to the lesser trochanter with an oscillating saw and completed this on osteotome.  We placed a corkscrew guide in the femoral head, removed the femoral head in its entirety and found to be completely devoid of cartilage.  We then cleaned the acetabular remnants of the acetabular labrum and other debris.  I  placed a bent Hohmann over the medial acetabular rim.  I then began reaming from a size 42 reamer up to a size 54 with all reamers under direct visualization, the last reamer under direct fluoroscopy, so we could obtain our depth of reaming, our inclination, and anteversion.  Once we were pleased with this, we placed the real DePuy Sector Gription acetabular component size 54, and a 36+ 0 polyethylene liner for that size acetabular component. Attention was then turned to the femur.  With the leg externally rotated to 120 degrees, extended and abducted, we released the  lateral joint capsule.  I placed a Mueller retractor medially and a Hohmann retractor behind the greater trochanter and lateralized with a rongeur and used a box cutting osteotome to enter femoral canal.  We then began broaching from a size 8 broach up to a size 12.  With a size 12 in place, we trialed a standard offset femoral neck and a 36+ 1.5 hip ball, reduced this in the acetabulum.  We were pleased with stability, range of motion, leg length, and offset.  We then dislocated the hip and removed the trial components.  We placed the real Corail femoral component with standard offset, size 12 and the real 36+ 1.5 ceramic hip ball reduced this in the acetabulum.  Again, we were pleased with stability, leg length, offset, and range of motion.  We then irrigated the soft tissue with normal saline solution using pulsatile lavage.  We closed the joint capsule with interrupted #1 Ethibond suture followed by running #1 Vicryl in the tensor fascia, 0 Vicryl in the deep tissue, 2-0 Vicryl in the subcutaneous tissue, and a 4-0 Monocryl subcuticular stitch.  Steri- Strips were applied.  Well-padded sterile dressing was applied as well. He was taken off the Hana table, awakened, extubated, and taken to the recovery room in stable condition.  All final counts were correct. There were no complications noted.     Lind Guest. Ninfa Linden, M.D.     CYB/MEDQ  D:  05/29/2015  T:  05/30/2015  Job:  [id number]

## 2015-05-30 NOTE — Discharge Instructions (Signed)

## 2015-05-31 ENCOUNTER — Inpatient Hospital Stay (HOSPITAL_COMMUNITY): Payer: 59

## 2015-05-31 NOTE — Progress Notes (Signed)
Physical Therapy Treatment Patient Details Name: [name] [name] MRN: [id number] DOB: [birth year] Today's Date: 05/31/2015    History of Present Illness Pt s/p L THR with hx of CAD and CABG    PT Comments    POD # 2 am session Assisted OOB to amb a great distance in hallway then performed some THR TE's followeed by ICE.   Follow Up Recommendations  Home health PT     Equipment Recommendations  Rolling walker with 5" wheels    Recommendations for Other Services       Precautions / Restrictions Precautions Precautions: Fall Restrictions Weight Bearing Restrictions: No Other Position/Activity Restrictions: WBAT    Mobility  Bed Mobility Overal bed mobility: Needs Assistance Bed Mobility: Supine to Sit     Supine to sit: Min guard;HOB elevated     General bed mobility comments: demonstarted and instructed how to use a belt to assist L LE off bed  Transfers Overall transfer level: Needs assistance Equipment used: Rolling walker (2 wheeled) Transfers: Sit to/from Stand Sit to Stand: Supervision;Min guard         General transfer comment: cues for hand placement and LE management.  Ambulation/Gait Ambulation/Gait assistance: Min guard;Supervision Ambulation Distance (Feet): 132 Feet Assistive device: Rolling walker (2 wheeled) Gait Pattern/deviations: Step-to pattern;Step-through pattern;Trunk flexed Gait velocity: decr   General Gait Details: cues for posture, position from RW and initial sequence   Stairs            Wheelchair Mobility    Modified Rankin (Stroke Patients Only)       Balance                                    Cognition Arousal/Alertness: Awake/alert Behavior During Therapy: WFL for tasks assessed/performed Overall Cognitive Status: Within Functional Limits for tasks assessed                      Exercises   Total Hip Replacement TE's 10 reps ankle pumps 10 reps knee presses 10 reps heel  slides 10 reps SAQ's 10 reps ABD Followed by ICE     General Comments        Pertinent Vitals/Pain Pain Assessment: 0-10 Pain Score: 6  Pain Location: L hip Pain Descriptors / Indicators: Grimacing;Tender Pain Intervention(s): Monitored during session;Repositioned;Ice applied    Home Living                      Prior Function            PT Goals (current goals can now be found in the care plan section) Progress towards PT goals: Progressing toward goals    Frequency  7X/week    PT Plan Current plan remains appropriate    Co-evaluation             End of Session Equipment Utilized During Treatment: Gait belt Activity Tolerance: Patient tolerated treatment well Patient left: in chair;with call bell/phone within reach;with chair alarm set     Time: [id number] PT Time Calculation (min) (ACUTE ONLY): 39 min  Charges:  $Gait Training: 8-22 mins $Therapeutic Exercise: 8-22 mins $Therapeutic Activity: 8-22 mins                    G Codes:      Rica Koyanagi  PTA [facility]  Acute  Rehab Pager      [phone]

## 2015-05-31 NOTE — Care Management Note (Signed)
Case Management Note  Patient Details  Name: [name] [name] MRN: [id number] Date of Birth: 02/22/1955  Subjective/Objective:                  L THR   Action/Plan: CM spoke with the patient and his wife at the bedside. [facility] Go was arrange pre-operatively for HHPT. Patient is agreeable. Patient needs a 3N1 and RW. Merry Proud at [facility] notified of the DME referral and will deliver DME to the patient's room. Patient lives at home with his wife who will be able to provide assistance. Dona at [facility] requested the patient's physical address. Address provided by patient is [place].   Expected Discharge Date:  06/07/15               Expected Discharge Plan:  [place]  In-House Referral:     Discharge planning Services  CM Consult  Post Acute Care Choice:  Home Health Choice offered to:  Patient  DME Arranged:  3-N-1, Walker rolling DME Agency:  [place]:  PT [place]:  [place]  Status of Service:  Completed, signed off  Medicare Important Message Given:    Date Medicare IM Given:    Medicare IM give by:    Date Additional Medicare IM Given:    Additional Medicare Important Message give by:     If discussed at [place] of Stay Meetings, dates discussed:    Additional Comments:  Apolonio Schneiders, RN 05/31/2015, 12:13 PM

## 2015-05-31 NOTE — Progress Notes (Signed)
Patient ID: [name] [name], male   DOB: [birth year], 60 y.o.   MRN: [id number] Patient is making slow steady progress with therapy status post left total hip arthroplasty. No new labs today previous hemoglobin stable. Plan for discharge to home on Monday. Patient is comfortable.

## 2015-05-31 NOTE — Progress Notes (Signed)
Physical Therapy Treatment Patient Details Name: NYAIRE AMJAD MRN: [id number] DOB: 1955/06/21 Today's Date: 05/31/2015    History of Present Illness Pt s/p L THR with hx of CAD and CABG    PT Comments    POD # 2 pm session amb a greater distance and practiced stairs with spouse.   Follow Up Recommendations  Home health PT     Equipment Recommendations  Rolling walker with 5" wheels    Recommendations for Other Services       Precautions / Restrictions Precautions Precautions: Fall Restrictions Weight Bearing Restrictions: No Other Position/Activity Restrictions: WBAT    Mobility  Bed Mobility Overal bed mobility: Needs Assistance Bed Mobility: Supine to Sit     Supine to sit: Min guard;HOB elevated     General bed mobility comments: demonstarted and instructed how to use a belt to assist L LE off bed  Transfers Overall transfer level: Needs assistance Equipment used: Rolling walker (2 wheeled) Transfers: Sit to/from Stand Sit to Stand: Supervision;Min guard         General transfer comment: cues for hand placement and LE management.  Ambulation/Gait Ambulation/Gait assistance: Min guard;Supervision Ambulation Distance (Feet): 175 Feet Assistive device: Rolling walker (2 wheeled) Gait Pattern/deviations: Step-to pattern;Step-through pattern;Trunk flexed Gait velocity: decr   General Gait Details: cues for posture, position from RW and initial sequence   Stairs Stairs: Yes Stairs assistance: Min guard Stair Management: One rail Right;Step to pattern;Forwards;With crutches Number of Stairs: 4 General stair comments: with spouse present for family education  Wheelchair Mobility    Modified Rankin (Stroke Patients Only)       Balance                                    Cognition Arousal/Alertness: Awake/alert Behavior During Therapy: WFL for tasks assessed/performed Overall Cognitive Status: Within Functional Limits for  tasks assessed                      Exercises      General Comments        Pertinent Vitals/Pain Pain Assessment: 0-10 Pain Score: 6  Pain Location: L hip Pain Descriptors / Indicators: Grimacing;Tender Pain Intervention(s): Monitored during session;Repositioned;Ice applied    Home Living                      Prior Function            PT Goals (current goals can now be found in the care plan section) Progress towards PT goals: Progressing toward goals    Frequency  7X/week    PT Plan Current plan remains appropriate    Co-evaluation             End of Session Equipment Utilized During Treatment: Gait belt Activity Tolerance: Patient tolerated treatment well Patient left: in chair;with call bell/phone within reach;with chair alarm set     Time: [id number] PT Time Calculation (min) (ACUTE ONLY): 28 min  Charges:  $Gait Training: 8-22 mins $Therapeutic Activity: 8-22 mins                    G Codes:      Rica Koyanagi  PTA [facility]  Acute  Rehab Pager      [phone]

## 2015-05-31 NOTE — Evaluation (Addendum)
Occupational Therapy Evaluation Patient Details Name: [name] [name] MRN: [id number] DOB: November 19, 1955 Today's Date: 05/31/2015    History of Present Illness Pt s/p L THR with hx of CAD and CABG   Clinical Impression   Pt doing well though reports feeling stiff today. Wife present for education. Pt planning d/c for tomorrow.    Follow Up Recommendations  No OT follow up;Supervision/Assistance - 24 hour    Equipment Recommendations  3 in 1 bedside comode    Recommendations for Other Services       Precautions / Restrictions Precautions Precautions: Fall Restrictions Weight Bearing Restrictions: No Other Position/Activity Restrictions: WBAT      Mobility Bed Mobility Overal bed mobility: Needs Assistance Bed Mobility: Supine to Sit     Supine to sit: Min guard;HOB elevated     General bed mobility comments: increased time.  Transfers Overall transfer level: Needs assistance Equipment used: Rolling walker (2 wheeled) Transfers: Sit to/from Stand Sit to Stand: Min guard         General transfer comment: cues for hand placement and LE management.    Balance                                            ADL Overall ADL's : Needs assistance/impaired Eating/Feeding: Independent;Sitting   Grooming: Wash/dry hands;Sitting;Set up   Upper Body Bathing: Set up;Sitting   Lower Body Bathing: Minimal assistance;Sit to/from stand   Upper Body Dressing : Set up;Sitting   Lower Body Dressing: Moderate assistance;Sit to/from stand   Toilet Transfer: Min guard;Ambulation;RW;BSC   Toileting- Clothing Manipulation and Hygiene: Minimal assistance;Sit to/from stand   Tub/ Shower Transfer: Min guard;Walk-in shower;Rolling walker     General ADL Comments: Edcuated on AE options but wife is able to assist initially. Educated on sequence for LB dresing and safety. Explained how to adjust 3in1 to appropriate height and placement of 3in1 in shower for  safety. Issued shower transfer handout and reviewed. Pt doing well but reports feeling "very stiff" today.      Vision     Perception     Praxis      Pertinent Vitals/Pain Pain Assessment: 0-10 Pain Score: 4  Pain Location: 2 at rest. 4 with activity Pain Descriptors / Indicators: Tightness Pain Intervention(s): Monitored during session;Repositioned;Ice applied     Hand Dominance     Extremity/Trunk Assessment Upper Extremity Assessment Upper Extremity Assessment: Overall WFL for tasks assessed           Communication Communication Communication: No difficulties   Cognition Arousal/Alertness: Awake/alert Behavior During Therapy: WFL for tasks assessed/performed Overall Cognitive Status: Within Functional Limits for tasks assessed                     General Comments       Exercises       Shoulder Instructions      Home Living Family/patient expects to be discharged to:: Private residence Living Arrangements: Spouse/significant other Available Help at Discharge: Family Type of Home: House Home Access: Stairs to enter Technical brewer of Steps: 3 Entrance Stairs-Rails: Right Home Layout: Two level Alternate Level Stairs-Number of Steps: 1   Bathroom Shower/Tub: Occupational psychologist: Standard     Home Equipment: None          Prior Functioning/Environment Level of Independence: Independent  OT Diagnosis: Generalized weakness   OT Problem List: Decreased strength   OT Treatment/Interventions: Self-care/ADL training;DME and/or AE instruction;Patient/family education;Therapeutic activities    OT Goals(Current goals can be found in the care plan section) Acute Rehab OT Goals Patient Stated Goal: return to independence. OT Goal Formulation: With patient/family Time For Goal Achievement: 06/07/15 Potential to Achieve Goals: Good ADL Goals Pt Will Perform Grooming: with supervision;standing Pt Will  Perform Lower Body Dressing: with supervision;sit to/from stand Pt Will Transfer to Toilet: with supervision;ambulating;bedside commode Pt Will Perform Toileting - Clothing Manipulation and hygiene: with supervision;sit to/from stand Pt Will Perform Tub/Shower Transfer: Shower transfer;with supervision;3 in 1;rolling walker  OT Frequency: Min 2X/week   Barriers to D/C:            Co-evaluation              End of Session Equipment Utilized During Treatment: Rolling walker  Activity Tolerance: Patient tolerated treatment well Patient left: in chair;with chair alarm set;with family/visitor present   Time: [id number] OT Time Calculation (min): 40 min Charges:  OT General Charges $OT Visit: 1 Procedure OT Evaluation $OT Eval Low Complexity: 1 Procedure OT Treatments $Self Care/Home Management : 8-22 mins $Therapeutic Activity: 8-22 mins G-Codes:    Jules Schick  [id number] 05/31/2015, 10:30 AM

## 2015-06-01 NOTE — Progress Notes (Signed)
Subjective: 3 Days Post-Op Procedure(s) (LRB): LEFT TOTAL HIP ARTHROPLASTY ANTERIOR APPROACH (Left) Patient reports pain as moderate.    Objective: Vital signs in last 24 hours: Temp:  [99.3 F (37.4 C)-103.1 F (39.5 C)] 100.6 F (38.1 C) (05/22 0607) Pulse Rate:  [73-87] 73 (05/22 0607) Resp:  [16-18] 18 (05/22 0607) BP: (115-126)/(58-71) 124/62 mmHg (05/22 0607) SpO2:  [95 %-97 %] 97 % (05/22 0607)  Intake/Output from previous day: 05/21 0701 - 05/22 0700 In: 120 [P.O.:120] Out: 2325 [Urine:2325] Intake/Output this shift:     Recent Labs  05/30/15 0401  HGB 10.1*    Recent Labs  05/30/15 0401  WBC 4.9  RBC 3.29*  HCT 29.8*  PLT 161    Recent Labs  05/30/15 0401  NA 139  K 3.7  CL 104  CO2 31  BUN 15  CREATININE 0.90  GLUCOSE 109*  CALCIUM 8.4*   No results for input(s): LABPT, INR in the last 72 hours.  Sensation intact distally Intact pulses distally Dorsiflexion/Plantar flexion intact Incision: dressing C/D/I  Assessment/Plan: 3 Days Post-Op Procedure(s) (LRB): LEFT TOTAL HIP ARTHROPLASTY ANTERIOR APPROACH (Left) Up with therapy Discharge home with home health today.  Levonia Wolfley Y 06/01/2015, 7:19 AM

## 2015-06-01 NOTE — Progress Notes (Signed)
Pt to d/c home with [facility]. DME (3n1 & RW) delivered to room prior to d/c. AVS reviewed and "My Chart" discussed with pt. Pt capable of verbalizing medications, signs and symptoms of infection, and follow-up appointments. Remains hemodynamically stable. No signs and symptoms of distress. Educated pt to return to ER in the case of SOB, dizziness, or chest pain.

## 2015-06-01 NOTE — Discharge Summary (Signed)
Patient ID: [name] [name] MRN: [id number] DOB/AGE: 1955-07-18 60 y.o.  Admit date: 05/29/2015 Discharge date: 06/01/2015  Admission Diagnoses:  Principal Problem:   Osteoarthritis of left hip Active Problems:   Status post total replacement of left hip   Discharge Diagnoses:  Same  Past Medical History  Diagnosis Date  . Essential hypertension, benign   . Obesity   . Colon polyps   . Chest pain   . Hyperlipemia   . CAD (coronary artery disease)   . Tobacco abuse   . Heart murmur   . Complication of anesthesia     hard to wake up  . Headache     migraines as teenager  . Blood clot in vein     superficial / right leg following CABG  . History of kidney stones   . Arthritis   . History of blood transfusion     Surgeries: Procedure(s): LEFT TOTAL HIP ARTHROPLASTY ANTERIOR APPROACH on 05/29/2015   Consultants:    Discharged Condition: Improved  Hospital Course: [name] [name] is an 60 y.o. male who was admitted 05/29/2015 for operative treatment ofOsteoarthritis of left hip. Patient has severe unremitting pain that affects sleep, daily activities, and work/hobbies. After pre-op clearance the patient was taken to the operating room on 05/29/2015 and underwent  Procedure(s): LEFT TOTAL HIP ARTHROPLASTY ANTERIOR APPROACH.    Patient was given perioperative antibiotics: Anti-infectives    Start     Dose/Rate Route Frequency Ordered Stop   05/29/15 1500  ceFAZolin (ANCEF) IVPB 1 g/50 mL premix     1 g 100 mL/hr over 30 Minutes Intravenous Every 6 hours 05/29/15 1352 05/29/15 2107   05/29/15 0735  ceFAZolin (ANCEF) IVPB 2g/100 mL premix     2 g 200 mL/hr over 30 Minutes Intravenous On call to O.R. 05/29/15 [id number] 05/29/15 [id number]       Patient was given sequential compression devices, early ambulation, and chemoprophylaxis to prevent DVT.  Patient benefited maximally from hospital stay and there were no complications.    Recent vital signs: Patient Vitals for the past  24 hrs:  BP Temp Temp src Pulse Resp SpO2  06/01/15 0607 124/62 mmHg (!) 100.6 F (38.1 C) Oral 73 18 97 %  05/31/15 2130 (!) 115/58 mmHg 99.3 F (37.4 C) Oral 87 16 95 %  05/31/15 2027 - (!) 100.7 F (38.2 C) Oral - - -  05/31/15 1923 - (!) 103.1 F (39.5 C) Oral - - -  05/31/15 0901 126/71 mmHg - - - - -     Recent laboratory studies:  Recent Labs  05/30/15 0401  WBC 4.9  HGB 10.1*  HCT 29.8*  PLT 161  NA 139  K 3.7  CL 104  CO2 31  BUN 15  CREATININE 0.90  GLUCOSE 109*  CALCIUM 8.4*     Discharge Medications:     Medication List    STOP taking these medications        ibuprofen 200 MG tablet  Commonly known as:  ADVIL,MOTRIN      TAKE these medications        acetaminophen 500 MG tablet  Commonly known as:  TYLENOL  Take 1,000 mg by mouth every 6 (six) hours as needed for mild pain or moderate pain.     aspirin 325 MG EC tablet  Take 1 tablet (325 mg total) by mouth 2 (two) times daily after a meal. Reported on 05/25/2015     losartan-hydrochlorothiazide 100-25 MG tablet  Commonly  known as:  HYZAAR  Take 1 tablet by mouth daily.     methocarbamol 500 MG tablet  Commonly known as:  ROBAXIN  Take 1 tablet (500 mg total) by mouth every 6 (six) hours as needed for muscle spasms.     metoprolol tartrate 25 MG tablet  Commonly known as:  LOPRESSOR  TAKE 1 TABLET (25 MG TOTAL) BY MOUTH 2 (TWO) TIMES DAILY.     nitroGLYCERIN 0.4 MG SL tablet  Commonly known as:  NITROSTAT  Place 1 tablet (0.4 mg total) under the tongue every 5 (five) minutes as needed for chest pain.     ondansetron 8 MG disintegrating tablet  Commonly known as:  ZOFRAN ODT  Take 1 tablet (8 mg total) by mouth every 8 (eight) hours as needed for nausea or vomiting.     oxyCODONE-acetaminophen 5-325 MG tablet  Commonly known as:  ROXICET  Take 1-2 tablets by mouth every 4 (four) hours as needed.     simvastatin 40 MG tablet  Commonly known as:  ZOCOR  TAKE 1 TABLET BY MOUTH AT  BEDTIME        Diagnostic Studies: Dg Chest 2 View  05/31/2015  CLINICAL DATA:  Fever and cough since left hip surgery on May 19th. EXAM: CHEST  2 VIEW COMPARISON:  Chest x-rays dated 05/25/2015 and 05/23/2014. FINDINGS: Heart size is normal. Overall cardiomediastinal silhouette is stable in size and configuration. Patient is status post median sternotomy for CABG. Lungs are clear. No evidence of pneumonia. No pleural effusion or pneumothorax seen. There is stable elevation of the right hemidiaphragm. No evidence of acute osseous abnormality. IMPRESSION: Lungs are clear and there is no evidence of acute cardiopulmonary abnormality. No evidence of pneumonia. Electronically Signed   By: Franki Cabot M.D.   On: 05/31/2015 21:18   Dg Chest 2 View  05/25/2015  CLINICAL DATA:  History of CABG and prior history of smoking. EXAM: CHEST  2 VIEW COMPARISON:  05/23/2014 FINDINGS: Postsurgical changes from CABG are stable. Mediastinal wires are intact. Cardiomediastinal silhouette is normal. Mediastinal contours appear intact. The thoracic aorta is torturous. There is no evidence of focal airspace consolidation, pleural effusion or pneumothorax. Osseous structures are without acute abnormality. Soft tissues are grossly normal. IMPRESSION: No active cardiopulmonary disease. Electronically Signed   By: Fidela Salisbury M.D.   On: 05/25/2015 09:38   Dg C-arm 1-60 Min-no Report  05/29/2015  CLINICAL DATA: surgery C-ARM 1-60 MINUTES Fluoroscopy was utilized by the requesting physician.  No radiographic interpretation.   Dg Hip Port Unilat With Pelvis 1v Left  05/29/2015  CLINICAL DATA:  Status post total replacement of left hip EXAM: DG HIP (WITH OR WITHOUT PELVIS) 1V PORT LEFT COMPARISON:  None. FINDINGS: LEFT hip total arthroplasty.  No fracture or dislocation. IMPRESSION: No complication following LEFT hip total arthroplasty. Electronically Signed   By: Suzy Bouchard M.D.   On: 05/29/2015 11:35   Dg Hip  Operative Unilat W Or W/o Pelvis Left  05/29/2015  CLINICAL DATA:  Left total hip replacement EXAM: OPERATIVE left HIP (WITH PELVIS IF PERFORMED) 2 VIEWS TECHNIQUE: Fluoroscopic spot image(s) were submitted for interpretation post-operatively. COMPARISON:  None. FINDINGS: Two views submitted for interpretation. There is left hip prosthesis with anatomic alignment. IMPRESSION: Left hip prosthesis with anatomic alignment. Fluoroscopy time was 34 seconds.  Please see the operative report. Electronically Signed   By: Lahoma Crocker M.D.   On: 05/29/2015 10:44    Disposition: 01-Home or Self Care  Discharge Instructions    Discharge patient    Complete by:  As directed            Follow-up Information    Follow up with Mcarthur Rossetti, MD. Schedule an appointment as soon as possible for a visit in 2 weeks.   Specialty:  Orthopedic Surgery   Contact information:   [place] [address] [phone]       Follow up with [facility]   Why:  home health physical therapy   Contact information:   [address] [place] [address] [phone]       Follow up with Mcarthur Rossetti, MD In 2 weeks.   Specialty:  Orthopedic Surgery   Contact information:   [place] [address] [phone]        Signed: Mcarthur Rossetti 06/01/2015, 7:20 AM

## 2015-06-01 NOTE — Progress Notes (Signed)
Physical Therapy Treatment Patient Details Name: [name] [name] MRN: [id number] DOB: January 06, 1956 Today's Date: 06/01/2015    History of Present Illness Pt s/p L THR with hx of CAD and CABG    PT Comments    POD # 3 Tolerated amb a greater distance, practiced stairs with spouse then performed all THR TE's following HEP.  Pt ready for D/C to home.  Follow Up Recommendations  Home health PT     Equipment Recommendations  Rolling walker with 5" wheels    Recommendations for Other Services       Precautions / Restrictions Precautions Precautions: Fall Restrictions Weight Bearing Restrictions: No Other Position/Activity Restrictions: WBAT    Mobility  Bed Mobility               General bed mobility comments: OOB in recliner  Transfers Overall transfer level: Needs assistance Equipment used: Rolling walker (2 wheeled) Transfers: Sit to/from Stand Sit to Stand: Supervision         General transfer comment: good safety cognition  Ambulation/Gait Ambulation/Gait assistance: Supervision Ambulation Distance (Feet): 185 Feet Assistive device: Rolling walker (2 wheeled) Gait Pattern/deviations: Step-to pattern     General Gait Details: good steady gait   Stairs Stairs: Yes Stairs assistance: Min guard Stair Management: One rail Right;Step to pattern;Forwards;With crutches Number of Stairs: 4 General stair comments: with spouse present for family education  Wheelchair Mobility    Modified Rankin (Stroke Patients Only)       Balance                                    Cognition                            Exercises   Total Hip Replacement TE's 10 reps ankle pumps 10 reps knee presses 10 reps heel slides 10 reps SAQ's 10 reps ABD Followed by [facility]     General Comments        Pertinent Vitals/Pain      Home Living                      Prior Function            PT Goals (current goals can now be  found in the care plan section) Progress towards PT goals: Progressing toward goals    Frequency  7X/week    PT Plan Current plan remains appropriate    Co-evaluation             End of Session Equipment Utilized During Treatment: Gait belt Activity Tolerance: Patient tolerated treatment well Patient left: in chair     Time: [id number] PT Time Calculation (min) (ACUTE ONLY): 31 min  Charges:  $Gait Training: 8-22 mins $Therapeutic Exercise: 8-22 mins                    G Codes:      Rica Koyanagi  PTA [facility]  Acute  Rehab Pager      [phone]

## 2015-06-04 ENCOUNTER — Telehealth: Payer: Self-pay | Admitting: Internal Medicine

## 2015-06-04 NOTE — Telephone Encounter (Signed)
Spoke with pt and notified of recs per CDY  Pt verbalized understanding  Will forward to Dr Melvyn Novas just as an Pharmacist, hospital

## 2015-06-04 NOTE — Telephone Encounter (Signed)
Doesn't sound like infection now. May be fluid overload left over from the surgery stay, with a little airway irritation from anesthesia. Ok to take Mucinex, and maybe an otc cough syrup through the weekend.  If he begins running more fever or coughing discolored sputum or really feeling bad, then may need to go to an urgent care or ER this weekend

## 2015-06-04 NOTE — Telephone Encounter (Signed)
Spoke with the pt  He is c/o chest congestion and cough with clear sputum x 5 days  He recently had hip replacement surgery 05-29-15 and had fever off and on while in the hospital but none now  He states that he is not having and SOB, wheezing, chest tightness or pain  He had cxr on 05/31/15 and here is the impression:   IMPRESSION: Lungs are clear and there is no evidence of acute cardiopulmonary abnormality. No evidence of pneumonia.  Pt is taking mucinex bid   Please advise thanks!  Current Outpatient Prescriptions on File Prior to Visit  Medication Sig Dispense Refill  . acetaminophen (TYLENOL) 500 MG tablet Take 1,000 mg by mouth every 6 (six) hours as needed for mild pain or moderate pain.     Marland Kitchen aspirin 325 MG EC tablet Take 1 tablet (325 mg total) by mouth 2 (two) times daily after a meal. Reported on 05/25/2015 60 tablet 0  . losartan-hydrochlorothiazide (HYZAAR) 100-25 MG tablet Take 1 tablet by mouth daily. 30 tablet 11  . methocarbamol (ROBAXIN) 500 MG tablet Take 1 tablet (500 mg total) by mouth every 6 (six) hours as needed for muscle spasms. 60 tablet 0  . metoprolol tartrate (LOPRESSOR) 25 MG tablet TAKE 1 TABLET (25 MG TOTAL) BY MOUTH 2 (TWO) TIMES DAILY. 60 tablet 11  . nitroGLYCERIN (NITROSTAT) 0.4 MG SL tablet Place 1 tablet (0.4 mg total) under the tongue every 5 (five) minutes as needed for chest pain. 25 tablet 1  . ondansetron (ZOFRAN ODT) 8 MG disintegrating tablet Take 1 tablet (8 mg total) by mouth every 8 (eight) hours as needed for nausea or vomiting. 20 tablet 0  . oxyCODONE-acetaminophen (ROXICET) 5-325 MG tablet Take 1-2 tablets by mouth every 4 (four) hours as needed. 60 tablet 0  . simvastatin (ZOCOR) 40 MG tablet TAKE 1 TABLET BY MOUTH AT BEDTIME 30 tablet 11   No current facility-administered medications on file prior to visit.    Allergies  Allergen Reactions  . Codeine Other (See Comments)    migraines

## 2015-06-20 ENCOUNTER — Other Ambulatory Visit: Payer: Self-pay | Admitting: Internal Medicine

## 2015-09-24 ENCOUNTER — Other Ambulatory Visit: Payer: Self-pay | Admitting: Internal Medicine

## 2015-10-26 ENCOUNTER — Telehealth: Payer: Self-pay

## 2015-10-26 NOTE — Telephone Encounter (Signed)
[name] [name] with Dr. Waldon Reining office called about a test that was preformed at their office. They wanted Dr. Johnsie Cancel to review and be aware of results. Since image is hard to view through fax, they will be email image to our office with report. Will have Dr. Johnsie Cancel review once report is received.

## 2015-10-27 NOTE — Telephone Encounter (Signed)
Dr. Johnsie Cancel reviewed. Nothing to report. Patient's last carotid doppler (02/27/14) showed, some plaque and no bad stenosis. Patient is suppose to have a repeat doppler in February (2018).

## 2016-01-19 ENCOUNTER — Other Ambulatory Visit: Payer: Self-pay | Admitting: Cardiovascular Disease

## 2016-01-20 NOTE — Telephone Encounter (Signed)
Rx refill sent to pharmacy. 

## 2016-01-25 NOTE — Progress Notes (Signed)
Patient ID: [name] [name], male   DOB: Jul 18, 1955, 61 y.o.   MRN: [id number]   61 y.o. seen for f/u CAD  S/P CABG 10/19/10 Servando Snare F/U CAD  SURGICAL PROCEDURE: Coronary artery bypass grafting x4 with the left  internal mammary to the left anterior descending coronary artery,  reverse saphenous vein graft to the second diagonal coronary artery,  reverse saphenous vein graft to the first obtuse marginal, reverse  saphenous vein graft to the posterior descending coronary artery with  right leg endovein harvesting.    Duplex 02/27/14  Plaque no stenosis f/u again [id number]    Had uncomplicated left THR with Dr Ninfa Linden on 05/30/15  Able to walk with his labs now   Older brother had to have CABG in April   ROS: Denies fever, malais, weight loss, blurry vision, decreased visual acuity, cough, sputum, SOB, hemoptysis, pleuritic pain, palpitaitons, heartburn, abdominal pain, melena, lower extremity edema, claudication, or rash.  All other systems reviewed and negative  General: Affect appropriate Healthy:  appears stated age 61: normal Neck supple with no adenopathy JVP normal left  bruits no thyromegaly Lungs clear with no wheezing and good diaphragmatic motion Heart:  S1/S2 no murmur, no rub, gallop or click PMI normal Abdomen: benighn, BS positve, no tenderness, no AAA no bruit.  No HSM or HJR Distal pulses intact with no bruits No edema Neuro non-focal Skin warm and dry No muscular weakness   Current Outpatient Prescriptions  Medication Sig Dispense Refill  . acetaminophen (TYLENOL) 500 MG tablet Take 1,000 mg by mouth every 6 (six) hours as needed for mild pain or moderate pain.     Marland Kitchen aspirin 325 MG tablet Take 325 mg by mouth daily.    Marland Kitchen losartan-hydrochlorothiazide (HYZAAR) 100-25 MG tablet Take 1 tablet by mouth daily. 90 tablet 3  . methocarbamol (ROBAXIN) 500 MG tablet Take 1 tablet (500 mg total) by mouth every 6 (six) hours as needed for muscle spasms. 60 tablet 0  .  metoprolol tartrate (LOPRESSOR) 25 MG tablet TAKE 1 TABLET (25 MG TOTAL) BY MOUTH 2 (TWO) TIMES DAILY. 60 tablet 11  . nitroGLYCERIN (NITROSTAT) 0.4 MG SL tablet Place 1 tablet (0.4 mg total) under the tongue every 5 (five) minutes as needed for chest pain. 25 tablet 1  . ondansetron (ZOFRAN ODT) 8 MG disintegrating tablet Take 1 tablet (8 mg total) by mouth every 8 (eight) hours as needed for nausea or vomiting. 20 tablet 0  . oxyCODONE-acetaminophen (ROXICET) 5-325 MG tablet Take 1-2 tablets by mouth every 4 (four) hours as needed. 60 tablet 0  . simvastatin (ZOCOR) 40 MG tablet TAKE 1 TABLET BY MOUTH AT BEDTIME 30 tablet 11   No current facility-administered medications for this visit.     Allergies  Codeine  Electrocardiogram:  01/02/12  SR rate 56 nonspecific inferolateral T wave changes  11/14/12 SR rate 54 RSR' same nonspecific ST changes 02/10/14  SR rate 67 nonspecific ST/T wave changes   Assessment and Plan CAD:  Stable no angina CABG 2012  HTN:  Much improved with diuretic Hematuria has f/u appt with Dr Jeffie Pollock  PSA normal Chol:  Cholesterol is at goal.  Continue current dose of statin and diet Rx.  No myalgias or side effects.  F/U  LFT's in 6 months. Lab Results  Component Value Date   LDLCALC 99 05/25/2015   Ortho: post left THR anterior approach improved mobility    F/U with me 6 months  Jenkins Rouge

## 2016-01-26 ENCOUNTER — Ambulatory Visit (INDEPENDENT_AMBULATORY_CARE_PROVIDER_SITE_OTHER): Payer: 59 | Admitting: Cardiovascular Disease

## 2016-01-26 ENCOUNTER — Encounter: Payer: Self-pay | Admitting: Cardiovascular Disease

## 2016-01-26 ENCOUNTER — Ambulatory Visit (INDEPENDENT_AMBULATORY_CARE_PROVIDER_SITE_OTHER): Payer: 59 | Admitting: Orthopaedic Surgery

## 2016-01-26 ENCOUNTER — Ambulatory Visit (INDEPENDENT_AMBULATORY_CARE_PROVIDER_SITE_OTHER): Payer: 59

## 2016-01-26 VITALS — BP 114/70 | HR 64 | Ht 68.0 in | Wt 198.1 lb

## 2016-01-26 DIAGNOSIS — Z96642 Presence of left artificial hip joint: Secondary | ICD-10-CM

## 2016-01-26 DIAGNOSIS — I2510 Atherosclerotic heart disease of native coronary artery without angina pectoris: Secondary | ICD-10-CM | POA: Diagnosis not present

## 2016-01-26 DIAGNOSIS — M25552 Pain in left hip: Secondary | ICD-10-CM

## 2016-01-26 DIAGNOSIS — I2583 Coronary atherosclerosis due to lipid rich plaque: Secondary | ICD-10-CM

## 2016-01-26 MED ORDER — NITROGLYCERIN 0.4 MG SL SUBL
0.4000 mg | SUBLINGUAL_TABLET | SUBLINGUAL | 1 refills | Status: DC | PRN
Start: 2016-01-26 — End: 2017-08-08

## 2016-01-26 MED ORDER — LOSARTAN POTASSIUM-HCTZ 100-25 MG PO TABS: 1.0000 | ORAL_TABLET | Freq: Every day | ORAL | 3 refills | Status: DC

## 2016-01-26 NOTE — Progress Notes (Signed)
The patient is very pleasant 61 year old gentleman who is 6 months out from a left total hip arthroplasty through a direct anterior approach. He has no complaints. He says he's doing well.  On examination he walks without a limp. His leg lengths are equal. He tolerated the easily putting his hip on the left side through internal rotation rotation. His right hip exam is also normal.  An AP pelvis shows a well-seated implant on the left side with no, getting features or signs of loosening. His right hip appears normal.  At this point he'll follow up as needed. We talked in detail about what would need to bring him back to the office in terms of that hip on the left side. We can always see him back for anything else.

## 2016-01-26 NOTE — Patient Instructions (Addendum)

## 2016-06-20 ENCOUNTER — Other Ambulatory Visit: Payer: Self-pay | Admitting: Internal Medicine

## 2016-09-02 ENCOUNTER — Other Ambulatory Visit (INDEPENDENT_AMBULATORY_CARE_PROVIDER_SITE_OTHER): Payer: BLUE CROSS/BLUE SHIELD

## 2016-09-02 ENCOUNTER — Ambulatory Visit (INDEPENDENT_AMBULATORY_CARE_PROVIDER_SITE_OTHER)
Admission: RE | Admit: 2016-09-02 | Discharge: 2016-09-02 | Disposition: A | Discharge status: Home / Self Care | Payer: BLUE CROSS/BLUE SHIELD | Source: Ambulatory Visit | Attending: Internal Medicine | Admitting: Internal Medicine

## 2016-09-02 ENCOUNTER — Ambulatory Visit (INDEPENDENT_AMBULATORY_CARE_PROVIDER_SITE_OTHER): Payer: BLUE CROSS/BLUE SHIELD | Admitting: Internal Medicine

## 2016-09-02 ENCOUNTER — Encounter: Payer: Self-pay | Admitting: Internal Medicine

## 2016-09-02 VITALS — BP 140/80 | HR 56 | Ht 68.0 in | Wt 200.0 lb

## 2016-09-02 DIAGNOSIS — Z0000 Encounter for general adult medical examination without abnormal findings: Secondary | ICD-10-CM

## 2016-09-02 DIAGNOSIS — E7800 Pure hypercholesterolemia, unspecified: Secondary | ICD-10-CM | POA: Diagnosis not present

## 2016-09-02 DIAGNOSIS — I10 Essential (primary) hypertension: Secondary | ICD-10-CM | POA: Diagnosis not present

## 2016-09-02 DIAGNOSIS — M542 Cervicalgia: Secondary | ICD-10-CM | POA: Diagnosis not present

## 2016-09-02 DIAGNOSIS — I25810 Atherosclerosis of coronary artery bypass graft(s) without angina pectoris: Secondary | ICD-10-CM | POA: Diagnosis not present

## 2016-09-02 LAB — URINALYSIS
Bilirubin Urine: NEGATIVE
Hgb urine dipstick: NEGATIVE
Ketones, ur: NEGATIVE
Leukocytes, UA: NEGATIVE
Nitrite: NEGATIVE
Specific Gravity, Urine: 1.025 (ref 1.000–1.030)
Total Protein, Urine: NEGATIVE
Urine Glucose: NEGATIVE
Urobilinogen, UA: 0.2 (ref 0.0–1.0)
pH: 6 (ref 5.0–8.0)

## 2016-09-02 LAB — SEDIMENTATION RATE: Sed Rate: 3 mm/hr (ref 0–20)

## 2016-09-02 LAB — HEPATIC FUNCTION PANEL
ALT: 29 U/L (ref 0–53)
AST: 25 U/L (ref 0–37)
Albumin: 4.6 g/dL (ref 3.5–5.2)
Alkaline Phosphatase: 39 U/L (ref 39–117)
Bilirubin, Direct: 0.1 mg/dL (ref 0.0–0.3)
Total Bilirubin: 0.6 mg/dL (ref 0.2–1.2)
Total Protein: 7.3 g/dL (ref 6.0–8.3)

## 2016-09-02 LAB — LIPID PANEL
Cholesterol: 127 mg/dL (ref 0–200)
HDL: 29.9 mg/dL — ABNORMAL LOW (ref >39.00)
LDL Cholesterol: 80 mg/dL (ref 0–99)
NonHDL: 97.53
Total CHOL/HDL Ratio: 4
Triglycerides: 86 mg/dL (ref 0.0–149.0)
VLDL: 17.2 mg/dL (ref 0.0–40.0)

## 2016-09-02 LAB — BASIC METABOLIC PANEL
BUN: 19 mg/dL (ref 6–23)
CO2: 32 mEq/L (ref 19–32)
Calcium: 9.9 mg/dL (ref 8.4–10.5)
Chloride: 101 mEq/L (ref 96–112)
Creatinine, Ser: 1.02 mg/dL (ref 0.40–1.50)
GFR: 95.16 mL/min (ref >60.00)
Glucose, Bld: 106 mg/dL — ABNORMAL HIGH (ref 70–99)
Potassium: 3.9 mEq/L (ref 3.5–5.1)
Sodium: 140 mEq/L (ref 135–145)

## 2016-09-02 LAB — CBC WITH DIFFERENTIAL/PLATELET
Basophils Absolute: 0.1 10*3/uL (ref 0.0–0.1)
Basophils Relative: 1 % (ref 0.0–3.0)
Eosinophils Absolute: 0.1 10*3/uL (ref 0.0–0.7)
Eosinophils Relative: 2.3 % (ref 0.0–5.0)
HCT: 38.3 % — ABNORMAL LOW (ref 39.0–52.0)
Hemoglobin: 12.9 g/dL — ABNORMAL LOW (ref 13.0–17.0)
Lymphocytes Relative: 31.8 % (ref 12.0–46.0)
Lymphs Abs: 1.8 10*3/uL (ref 0.7–4.0)
MCHC: 33.8 g/dL (ref 30.0–36.0)
MCV: 91.7 fl (ref 78.0–100.0)
Monocytes Absolute: 0.5 10*3/uL (ref 0.1–1.0)
Monocytes Relative: 8.4 % (ref 3.0–12.0)
Neutro Abs: 3.3 10*3/uL (ref 1.4–7.7)
Neutrophils Relative %: 56.5 % (ref 43.0–77.0)
Platelets: 276 10*3/uL (ref 150.0–400.0)
RBC: 4.18 Mil/uL — ABNORMAL LOW (ref 4.22–5.81)
RDW: 12.7 % (ref 11.5–15.5)
WBC: 5.8 10*3/uL (ref 4.0–10.5)

## 2016-09-02 LAB — TSH: TSH: 1.04 u[IU]/mL (ref 0.35–4.50)

## 2016-09-02 LAB — PSA: PSA: 0.5 ng/mL (ref 0.10–4.00)

## 2016-09-02 NOTE — Progress Notes (Signed)
Patient ID: [name] [name], male   DOB: 01/17/1955   MRN: [id number]   Brief patient profile:  46 yobm quit smoking in early 1980's with hypertension hyperlipidemia and atypical chest pain with a left heart catheter showing 60% obstruction of the right-sided branches in the 1999 and a negative stress study in May of 2007> cabg required 10/2010    08/12/2010 ov/[name] [name]  Cc new onset cp comes and goes x 1 week so went to ER July 30th with neg w/u and much less cp since er.  Location = Left of sternum only, aching but   No relation to activity, lasts no more than a few seconds, not noticeable supine or sleeping.  Does ok with exercise with no pains. rec Cardiac eval    DATE OF ADMISSION: 10/18/2010  DATE OF DISCHARGE: 10/24/2010  DISCHARGE SUMMARY  PRIMARY ADMITTING DIAGNOSIS: Chest pain.  ADDITIONAL/DISCHARGE DIAGNOSES:  1. Coronary artery disease.  2. New-onset angina.  3. Hypertension.  4. Hyperlipidemia.  5. Remote history of tobacco abuse.  6. Postoperative acute blood loss anemia.  PROCEDURES PERFORMED:  1. Cardiac catheterization.  2. Coronary artery bypass grafting x4 (left internal mammary artery to  the LAD, saphenous vein graft to the second diagonal, saphenous  vein graft to the first obtuse marginal, saphenous vein graft to  the posterior descending).  3. Endoscopic vein harvest, right leg  04/04/2011 f/u ov/[name] [name] cc new L lat localized hip pain worse standing up, 3-4 weeks comes and goes, not tried nsaids, maybe worse toward end of day rec Try advil up to 3 with meals to see if helps hip pain and if not better within a week call for referral to an orthopedic surgeon> referred   01/02/2012 f/u ov/[name] [name] f/u hbp, hyperlipidemia complicated by CAD s/p cabg 10/19/10. No cp, tia or claudication symptoms >>no changed   05/23/2014 f/u ov/[name] [name] re: cpx  Chief Complaint  Patient presents with  . Follow-up    Pt states overall doing well.  No new co's today.  Not able to ex due to L  hip and no f/u with Dr [name] yet,  advil x 2 at hs  Some decrease libido rec No change rx   05/25/2015  f/u ov/[name] [name] re: cpx   Chief Complaint  Patient presents with  . Annual Exam    Pt is fasting. He is doing well and no co's today. He is scheduled to have total left replacement 05/29/15.   not able to ex due to hip/ rx tyl arthritis not on nsaids  No ex cp/ tia/ claudication symptoms / Not limited by breathing from desired activities  But by hip pain  rec Please remember to go to the lab and x-ray department downstairs for your tests - we will call you with the results when they are available. Pneumovax today  Call Dr Kyla Balzarine office to see if he can clear you for hip surgery  We need to see you yearly - sooner as needed - for CPX on return     09/02/2016 acute extended ov/[name] [name] re: neck pain  Chief Complaint  Patient presents with  . Acute Visit    Pt c/o feeling anxious recently. He states he has also noticed some dizziness when he bends over.  He states he has also noticed some soreness in the left side of his neck for the past.   new onset  10 days positional  L neck shoots into L post shoulder  Tylenol helps/ heat helps / no pain  with motion of L shoulder/ no numbness or weakness of arm or hand on L  Overdue for cpx  Not getting any ex at all but with ex denies sob or cp   No obvious day to day or daytime variability or assoc excess/ purulent sputum or mucus plugs or hemoptysis   or chest tightness, subjective wheeze or overt sinus or hb symptoms. No unusual exp hx or h/o childhood pna/ asthma or knowledge of premature birth.  Sleeping ok without nocturnal  or early am exacerbation  of respiratory  c/o's or need for noct saba. Also denies any obvious fluctuation of symptoms with weather or environmental changes or other aggravating or alleviating factors except as outlined above   Current Medications, Allergies, Complete Past Medical History, Past Surgical History, Family History,  and Social History were reviewed in [employer] record.  ROS  The following are not active complaints unless bolded sore throat, dysphagia, dental problems, itching, sneezing,  nasal congestion or excess/ purulent secretions, ear ache,   fever, chills, sweats, unintended wt loss, classically pleuritic or exertional cp,  orthopnea pnd or leg swelling, presyncope, palpitations, abdominal pain, anorexia, nausea, vomiting, diarrhea  or change in bowel or bladder habits, change in stools or urine, dysuria,hematuria,  rash, arthralgias, visual complaints, headache, numbness, weakness or ataxia or problems with walking or coordination,  change in mood/affect or memory.               Past Medical History from [facility]: HYPERTENSION, BENIGN ESSENTIAL, CONTROLLED (ICD-401.1)  OBESITY  - Target wt = 190 for BMI < 30  Colon polyps....................................................................................................[name]  - colonoscopy 4/04, letter sent 05/15/07 reviewed with pt July 17, 2008 and August 03, 2009 > referred back to GI  CHEST PAIN (ICD-786.50)  - s/p cabg 10/19/2010 HYPERLIPIDEMIA  - target LDL less than 70 based on documented ischemic heart disease  HEALTH MAINTENANCE..........................................................................................[name]  - TD April 2009  - CPX  05/25/2015      Family History:  hypertension in mother and father no premature heart disease in any direct relatives nor stroke diabetes or cancer, except that his mother has developed leukemia  1 brother healthy  Neg prostate ca   Social History:  quit smoking about 1983 denies alcohol use. Works at a [employer] home                      Objective:   Physical Exam  Ambulatory healthy appearing black male in no acute distress but does seem a bit anxious today    wt 201 November 20, 2007 > 205 April 18, 2008 > 184 01/03/2011 > 182 04/04/2011  > 07/04/2011  185  >189 09/19/2011 > 01/02/2012 194 >204 02/09/2012 >196 04/04/2012 > 04/25/2012 194 > 11/02/2012  193 > 01/19/2013  190 >  05/23/2014  193 > 05/25/2015 196 > 09/02/2016   200    Vital signs reviewed   - Note on arrival 02 sats  100% on RA and BP  140/80 with pulse 56    HEENT: nl dentition, turbinates bilaterally, and oropharynx. Nl external ear canals without cough reflex   NECK :  without JVD/Nodes/TM/ nl carotid upstrokes bilaterally - pain L neck elicited  on rot to R or abd to R with mild post cx muscle tenderness    LUNGS: no acc muscle use,  Nl contour chest which is clear to A and P bilaterally without cough on insp or exp maneuvers   CV:  RRR  no s3 or murmur or increase in P2, and no edema   ABD:  soft and nontender with nl inspiratory excursion in the supine position. No bruits or organomegaly appreciated, bowel sounds nl  MS:  Nl gait/ ext warm without deformities, calf tenderness, cyanosis or clubbing No obvious joint restrictions / see neck exam/ nl L shoulder   SKIN: warm and dry without lesions    NEURO:  alert, approp, nl sensorium with  no motor or cerebellar deficits apparent. / nl reflexes bilaterally             CXR PA and Lateral:   09/02/2016 :    I personally reviewed images and agree with radiology impression as follows:   No active cardiopulmonary disease.  Labs ordered/ reviewed:     Chemistry      Component Value Date/Time   NA 140 09/02/2016 1002   K 3.9 09/02/2016 1002   CL 101 09/02/2016 1002   CO2 32 09/02/2016 1002   BUN 19 09/02/2016 1002   CREATININE 1.02 09/02/2016 1002      Component Value Date/Time   CALCIUM 9.9 09/02/2016 1002   ALKPHOS 39 09/02/2016 1002   AST 25 09/02/2016 1002   ALT 29 09/02/2016 1002   BILITOT 0.6 09/02/2016 1002        Lab Results  Component Value Date   WBC 5.8 09/02/2016   HGB 12.9 (L) 09/02/2016   HCT 38.3 (L) 09/02/2016   MCV 91.7 09/02/2016   PLT 276.0 09/02/2016         Lab  Results  Component Value Date   TSH 1.04 09/02/2016         Lab Results  Component Value Date   ESRSEDRATE 3 09/02/2016   ESRSEDRATE 40 (H) 11/05/2010          ekg 09/02/2016   SB with nl pr, no ischemic changes      Assessment:

## 2016-09-02 NOTE — Progress Notes (Signed)
Spoke with pt and notified of results per Dr. [name]. Pt verbalized understanding and denied any questions. 

## 2016-09-02 NOTE — Patient Instructions (Addendum)
Neck stretching x 6 motions, hold each one x 30 secs and repeat as frequently as you can but at least sev times a day especially at bedtime and on rising in am   Heat/ advil 200 up 3 with meals as needed (9 max per 24h)   Please remember to go to the lab and x-ray department downstairs in the basement  for your tests - we will call you with the results when they are available.     Please schedule a follow up office visit in 6 weeks, call sooner if needed

## 2016-09-03 ENCOUNTER — Encounter: Payer: Self-pay | Admitting: Internal Medicine

## 2016-09-03 DIAGNOSIS — M542 Cervicalgia: Secondary | ICD-10-CM | POA: Insufficient documentation

## 2016-09-03 NOTE — Assessment & Plan Note (Signed)
-   Target LDL < 70 since has known CAD  Lab Results  Component Value Date   CHOL 127 09/02/2016   HDL 29.90 (L) 09/02/2016   LDLCALC 80 09/02/2016   TRIG 86.0 09/02/2016   CHOLHDL 4 09/02/2016  LFT's 09/02/2016 ok   Adequate control on present rx, reviewed in detail with pt > no change in rx needed  = zocor 40 mg daily

## 2016-09-03 NOTE — Assessment & Plan Note (Signed)
Onset 08/2016 rec stretching/ advil/ heat   Typical muscle strain pattern s radicular features > rx conservatively, f/u ortho prn

## 2016-09-03 NOTE — Assessment & Plan Note (Signed)
Updated today with labs and xrays but needs to return for for physical.

## 2016-09-03 NOTE — Assessment & Plan Note (Signed)
Lab Results  Component Value Date   CREATININE 1.02 09/02/2016   CREATININE 0.90 05/30/2015   CREATININE 0.95 05/22/2015    Adequate control on present rx, reviewed in detail with pt > no change in rx needed  / adequately beta blocked

## 2016-09-20 ENCOUNTER — Other Ambulatory Visit: Payer: Self-pay | Admitting: Internal Medicine

## 2016-10-14 ENCOUNTER — Ambulatory Visit: Payer: BLUE CROSS/BLUE SHIELD | Admitting: Internal Medicine

## 2016-10-24 ENCOUNTER — Telehealth: Payer: Self-pay | Admitting: Internal Medicine

## 2016-10-24 DIAGNOSIS — M549 Dorsalgia, unspecified: Secondary | ICD-10-CM

## 2016-10-24 MED ORDER — CYCLOBENZAPRINE HCL 10 MG PO TABS: 10.0000 mg | ORAL_TABLET | Freq: Four times a day (QID) | ORAL | 0 refills | Status: DC | PRN

## 2016-10-24 NOTE — Telephone Encounter (Signed)
Spoke with patient. He stated that he pulled a muscle in his lower back on Thursday (10/20/16) by just opening the door to his car. Stated that he has been taking Advil and Tylenol extra strength with no relief.   He hurts more when first getting out of bed in the mornings and going from a sitting to standing position.   He is requesting a muscle relaxer to be sent into [facility] on [address].   MW, please advise. Thanks!

## 2016-10-24 NOTE — Telephone Encounter (Signed)
Pt aware of recs.  rx sent to preferred pharmacy.  Referral placed.  Nothing further needed.

## 2016-10-24 NOTE — Telephone Encounter (Signed)
flexoril 10 mg qid  Prn  #30   Go ahead and set him up with ortho eval he can cancel if improves - [facility]ics is fine

## 2016-10-25 ENCOUNTER — Encounter (INDEPENDENT_AMBULATORY_CARE_PROVIDER_SITE_OTHER): Payer: Self-pay | Admitting: Orthopaedic Surgery

## 2016-10-25 ENCOUNTER — Ambulatory Visit (INDEPENDENT_AMBULATORY_CARE_PROVIDER_SITE_OTHER): Payer: 59

## 2016-10-25 ENCOUNTER — Ambulatory Visit (INDEPENDENT_AMBULATORY_CARE_PROVIDER_SITE_OTHER): Payer: 59 | Admitting: Orthopaedic Surgery

## 2016-10-25 DIAGNOSIS — M545 Low back pain, unspecified: Secondary | ICD-10-CM

## 2016-10-25 MED ORDER — TIZANIDINE HCL 4 MG PO TABS: 4.0000 mg | ORAL_TABLET | Freq: Four times a day (QID) | ORAL | 2 refills | Status: DC | PRN

## 2016-10-25 MED ORDER — PREDNISONE 10 MG (21) PO TBPK: ORAL_TABLET | ORAL | 0 refills | Status: DC

## 2016-10-25 NOTE — Progress Notes (Signed)
Office Visit Note   Patient: [name] [name]           Date of Birth: Nov 25, 1955           MRN: [id number] Visit Date: 10/25/2016              Requested by: Tanda Rockers, MD [address], [place] [address] PCP: Tanda Rockers, MD   Assessment & Plan: Visit Diagnoses:  1. Acute midline low back pain without sciatica     Plan: Overall impression is lumbar strain. Prednisone and Zanaflex were prescribed. Physical therapy referral. Heating pad relative rest. Follow-up as needed.  Follow-Up Instructions: Return if symptoms worsen or fail to improve.   Orders:  Orders Placed This Encounter  Procedures  . XR Lumbar Spine 2-3 Views   Meds ordered this encounter  Medications  . predniSONE (STERAPRED UNI-PAK 21 TAB) 10 MG (21) TBPK tablet    Sig: Take as directed    Dispense:  21 tablet    Refill:  0  . tiZANidine (ZANAFLEX) 4 MG tablet    Sig: Take 1 tablet (4 mg total) by mouth every 6 (six) hours as needed for muscle spasms.    Dispense:  30 tablet    Refill:  2      Procedures: No procedures performed   Clinical Data: No additional findings.   Subjective: Chief Complaint  Patient presents with  . Lower Back - Pain    Patient is 61 year old gentleman who comes in with acute episode of low back pain without radiation or numbness and tingling. He was opening a car door when he felt it lock up. He has had previous episodes like this which have not lasted as long. He has taken some ibuprofen and Tylenol with Flexeril without any significant relief. Denies any numbness and tingling.    Review of Systems  Constitutional: Negative.   All other systems reviewed and are negative.    Objective: Vital Signs: There were no vitals taken for this visit.  Physical Exam  Constitutional: He is oriented to person, place, and time. He appears well-developed and well-nourished.  Pulmonary/Chest: Effort normal.  Abdominal: Soft.  Neurological: He is alert and  oriented to person, place, and time.  Skin: Skin is warm.  Psychiatric: He has a normal mood and affect. His behavior is normal. Judgment and thought content normal.  Nursing note and vitals reviewed.   Ortho Exam Exam of the lumbar spine and lower extremities are nonfocal.  Specialty Comments:  No specialty comments available.  Imaging: Xr Lumbar Spine 2-3 Views  Result Date: 10/25/2016 Lumbar spondylosis with significant degenerative disc disease at L5-S1    PMFS History: Patient Active Problem List   Diagnosis Date Noted  . Musculoskeletal neck pain 09/03/2016  . Osteoarthritis of left hip 05/29/2015  . Status post total replacement of left hip 05/29/2015  . Hematuria, microscopic 05/25/2014  . Acute upper respiratory infections of unspecified site 01/18/2013  . Left carotid bruit 11/14/2012  . Hot flashes 11/04/2012  . Health care maintenance 04/25/2012  . Left groin pain 04/04/2012  . Hip pain, left 04/06/2011  . Hx of CABG 11/05/2010  . CAD (coronary artery disease)   . COLONIC POLYPS 05/26/2007  . OBESITY 05/26/2007  . Essential hypertension 05/26/2007  . Chronic ischemic heart disease 05/26/2007  . HEMORRHOIDS, INTERNAL 05/26/2007  . ALLERGIC RHINITIS, CHRONIC 05/26/2007  . CONSTIPATION, CHRONIC 05/26/2007  . GASTROINTESTINAL HEMORRHAGE, HX OF 05/26/2007  . TOBACCO USE,  QUIT 05/26/2007  . HYPERCHOLESTEROLEMIA 05/09/2007  . BLADDER NECK OBSTRUCTION 05/09/2007   Past Medical History:  Diagnosis Date  . Arthritis   . Blood clot in vein    superficial / right leg following CABG  . CAD (coronary artery disease)   . Chest pain   . Colon polyps   . Complication of anesthesia    hard to wake up  . Essential hypertension, benign   . Headache    migraines as teenager  . Heart murmur   . History of blood transfusion   . History of kidney stones   . Hyperlipemia   . Obesity   . Tobacco abuse     Family History  Problem Relation Age of Onset  .  Hypertension Father   . Leukemia Mother   . Hypertension Mother   . Colon cancer Neg Hx     Past Surgical History:  Procedure Laterality Date  . CARDIAC CATHETERIZATION    . CIRCUMCISION  1986  . CORONARY ARTERY BYPASS GRAFT    . Coronary artery bypass grafting x4  10/21/2010  . CYSTOSCOPY WITH RETROGRADE PYELOGRAM, URETEROSCOPY AND STENT PLACEMENT Left 09/01/2014   Procedure: CYSTOSCOPY WITH RETROGRADE PYELOGRAM, URETEROSCOPY AND STENT PLACEMENT;  Surgeon: Irine Seal, MD;  Location: [facility];  Service: Urology;  Laterality: Left;  . CYSTOSCOPY WITH URETEROSCOPY AND STENT PLACEMENT Left 09/09/2014   Procedure: CYSTOSCOPY,LEFT URETEROSCOPY AND STENT PLACEMENT;  Surgeon: Irine Seal, MD;  Location: [facility];  Service: Urology;  Laterality: Left;  . HEMORRHOID SURGERY    . HOLMIUM LASER APPLICATION Left 02/01/4823   Procedure: WITH HOLMIUM LASER ;  Surgeon: Irine Seal, MD;  Location: [facility];  Service: Urology;  Laterality: Left;  . Stapled hemorrhoidopexy.  10/19/2006  . TOTAL HIP ARTHROPLASTY Left 05/29/2015   Procedure: LEFT TOTAL HIP ARTHROPLASTY ANTERIOR APPROACH;  Surgeon: Mcarthur Rossetti, MD;  Location: [facility];  Service: Orthopedics;  Laterality: Left;  Marland Kitchen VASECTOMY  1986   Social History   Occupational History  . [employer]    Social History Main Topics  . Smoking status: Former Smoker    Packs/day: 0.50    Years: 7.00    Types: Cigarettes    Quit date: 01/10/1981  . Smokeless tobacco: Never Used  . Alcohol use No  . Drug use: No  . Sexual activity: Not on file

## 2016-10-27 ENCOUNTER — Telehealth (INDEPENDENT_AMBULATORY_CARE_PROVIDER_SITE_OTHER): Payer: Self-pay | Admitting: Orthopaedic Surgery

## 2016-10-27 ENCOUNTER — Ambulatory Visit: Payer: BLUE CROSS/BLUE SHIELD | Admitting: Internal Medicine

## 2016-10-27 NOTE — Telephone Encounter (Signed)
Patient aware he does not need antibiotics before his dental cleanings

## 2016-10-27 NOTE — Telephone Encounter (Signed)
Patient called wanting to know if he needed antibiotics for dental appt for cleaning. [facility] did hip replacement and had given him some before his dental appt. Please call patient

## 2016-10-31 ENCOUNTER — Encounter (INDEPENDENT_AMBULATORY_CARE_PROVIDER_SITE_OTHER): Payer: Self-pay | Admitting: Physician Assistant

## 2016-10-31 ENCOUNTER — Ambulatory Visit (INDEPENDENT_AMBULATORY_CARE_PROVIDER_SITE_OTHER): Payer: 59 | Admitting: Physician Assistant

## 2016-10-31 DIAGNOSIS — M545 Low back pain, unspecified: Secondary | ICD-10-CM | POA: Insufficient documentation

## 2016-10-31 NOTE — Progress Notes (Signed)
Mr. Daughdrill returns today due to his low back pain.  He was last seen by Dr. Sherrian Divers last week with an acute onset of low back pain without radicular symptoms.  Again he was opening a car door when he felt his back "locked up" he has had previous back pain but not to this extent.  Dr. Sherrian Divers gave him a Medrol Dosepak and muscle relaxants which really did not help with his pain.  States his pain is worse.  Having no bowel or bladder dysfunction.  Pains mainly in the left lower lumbar region.  Physical exam: Well-developed well-nourished male who appears in pain.  Mood and affect appropriate. Lower extremities: Positive straight leg raise on the left negative on the right.  Tight hamstrings bilaterally.  5 out of 5 strength throughout the lower extremities against resistance.  Deep tendon reflexes are 1+ at the knees and ankles and equal and symmetric.  Dorsal pedal pulses are 2+ bilaterally.  Impression: Low back pain without radicular symptoms  Plan: Due to the fact the patient is having severe pain that is not responding to conservative treatment recommend MRI of the lumbar spine to rule out HNP.  Recommended heat to the low back.  Also going to therapy which she has not tried as of yet and seeing if they can do at least TENS unit or some tissue massage to help him with the pain.  We will see him back after the MRI of your results and discuss further treatment.

## 2016-11-01 ENCOUNTER — Ambulatory Visit: Payer: BLUE CROSS/BLUE SHIELD | Admitting: Internal Medicine

## 2016-11-02 ENCOUNTER — Ambulatory Visit (HOSPITAL_COMMUNITY)
Admission: RE | Admit: 2016-11-02 | Discharge: 2016-11-02 | Disposition: A | Discharge status: Home / Self Care | Payer: BLUE CROSS/BLUE SHIELD | Source: Ambulatory Visit | Attending: Physician Assistant | Admitting: Physician Assistant

## 2016-11-02 DIAGNOSIS — E882 Lipomatosis, not elsewhere classified: Secondary | ICD-10-CM | POA: Insufficient documentation

## 2016-11-02 DIAGNOSIS — M545 Low back pain, unspecified: Secondary | ICD-10-CM

## 2016-11-02 DIAGNOSIS — M5126 Other intervertebral disc displacement, lumbar region: Secondary | ICD-10-CM | POA: Insufficient documentation

## 2016-11-02 DIAGNOSIS — M48061 Spinal stenosis, lumbar region without neurogenic claudication: Secondary | ICD-10-CM | POA: Diagnosis not present

## 2016-11-02 DIAGNOSIS — M5127 Other intervertebral disc displacement, lumbosacral region: Secondary | ICD-10-CM | POA: Insufficient documentation

## 2016-11-03 ENCOUNTER — Ambulatory Visit (INDEPENDENT_AMBULATORY_CARE_PROVIDER_SITE_OTHER): Payer: 59 | Admitting: Physician Assistant

## 2016-11-04 ENCOUNTER — Encounter (INDEPENDENT_AMBULATORY_CARE_PROVIDER_SITE_OTHER): Payer: Self-pay | Admitting: Physician Assistant

## 2016-11-04 ENCOUNTER — Ambulatory Visit (INDEPENDENT_AMBULATORY_CARE_PROVIDER_SITE_OTHER): Payer: 59 | Admitting: Physician Assistant

## 2016-11-04 DIAGNOSIS — M545 Low back pain, unspecified: Secondary | ICD-10-CM

## 2016-11-04 NOTE — Progress Notes (Signed)
Mr. [name] [name] returns today follow-up of his low back pain status post MRI of the lumbar spine which was performed on 11/02/2016.  He states his back pain is somewhat improved but is still not 100%.  He continues to have no radicular symptoms down either leg.  He denies any bowel or bladder dysfunction.  He denies any waking pain.  Pain mostly low lumbar region and he states pain is worse on the right than the left.  Last night was dictating his pain is worse on his left he states that is always been less right. MRI is reviewed with the patient actual images are reviewed with the patient and his wife is present today.  These show a right subarticular disc extrusion at L4-5 with impingement on the right L5 nerve root.  Also shown a central disc protrusion at L5-S1 which closely approximates the descending S1 nerve roots but no impingement or displacement is noted.  Small disc protrusion is also noted at L2-3 which could potentially irritate the exiting right L2 nerve root. Physical exam: General: Well-developed well-nourished male who appears comfortable today.  Mood and affect are appropriate Lower extremities he has 5 out of 5 strength throughout the lower extremities against resistance.  Tight hamstrings bilaterally.  Positive straight leg on the right negative on the left.  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  Also would recommend a epidural steroid injection most likely at L5 possible transforaminal at L5 on the right.  Have him follow-up in 1 month from today's visit check his progress or lack.  However if his symptoms become worse then he should return sooner.  Questions were encouraged and answered both of patient and his wife who is present.

## 2016-11-07 NOTE — Addendum Note (Signed)
Addended byLaurann Montana on: 11/07/2016 08:05 AM   Modules accepted: Orders

## 2016-11-17 ENCOUNTER — Ambulatory Visit (INDEPENDENT_AMBULATORY_CARE_PROVIDER_SITE_OTHER): Payer: Self-pay

## 2016-11-17 ENCOUNTER — Ambulatory Visit (INDEPENDENT_AMBULATORY_CARE_PROVIDER_SITE_OTHER): Payer: BLUE CROSS/BLUE SHIELD | Admitting: Physical Medicine and Rehabilitation

## 2016-11-17 ENCOUNTER — Encounter (INDEPENDENT_AMBULATORY_CARE_PROVIDER_SITE_OTHER): Payer: Self-pay | Admitting: Physical Medicine and Rehabilitation

## 2016-11-17 VITALS — BP 136/78 | HR 77 | Temp 98.3°F

## 2016-11-17 DIAGNOSIS — M5116 Intervertebral disc disorders with radiculopathy, lumbar region: Secondary | ICD-10-CM

## 2016-11-17 DIAGNOSIS — M5416 Radiculopathy, lumbar region: Secondary | ICD-10-CM | POA: Diagnosis not present

## 2016-11-17 MED ORDER — LIDOCAINE HCL (PF) 1 % IJ SOLN
2.0000 mL | Freq: Once | INTRAMUSCULAR | Status: AC
  Administered 2016-11-17: 2 mL

## 2016-11-17 MED ORDER — BETAMETHASONE SOD PHOS & ACET 6 (3-3) MG/ML IJ SUSP
12.0000 mg | Freq: Once | INTRAMUSCULAR | Status: AC
  Administered 2016-11-17: 12 mg

## 2016-11-17 NOTE — Progress Notes (Deleted)
Pt here for injection of lumbar spine, states a lot better then it was, has to take tylenol to ease the pain now. No numbness or tingling. Moving around and standing, depends on what time of day it is when it hurts. Has driver, no allergy to contrast dye. Pt taking aspirin 325mg .

## 2016-11-17 NOTE — Patient Instructions (Signed)

## 2016-11-21 NOTE — Procedures (Signed)
[name] [name] is a 61 year old gentleman who is accompanied by his wife who provides some of the history.  He initially had severe right low back and hip and leg pain consistent with an L5 radicular pain.  He is still getting some symptoms of this but overall he is better with time and with medication.  He still has issues when he tries to lift or move and is quite problematic.  He has worsening pain with prolonged sitting.  We are going to complete a diagnostic and hopefully therapeutic right L5 transforaminal epidural steroid injection.  He will continue to follow-up with Benita Stabile and Dr. Ninfa Linden.  I think he will do well with intermittent flareups until this resolves on its own and we went over this at length.  The injection  will be diagnostic and hopefully therapeutic. The patient has failed conservative care including time, medications and activity modification.  Lumbosacral Transforaminal Epidural Steroid Injection - Sub-Pedicular Approach with Fluoroscopic Guidance  Patient: [name] [name]      Date of Birth: 10/12/1955 MRN: [id number] PCP: Tanda Rockers, MD      Visit Date: 11/17/2016   Universal Protocol:    Date/Time: 11/17/2016  Consent Given By: the patient  Position: PRONE  Additional Comments: Vital signs were monitored before and after the procedure. Patient was prepped and draped in the usual sterile fashion. The correct patient, procedure, and site was verified.   Injection Procedure Details:  Procedure Site One Meds Administered:  Meds ordered this encounter  Medications  . lidocaine (PF) (XYLOCAINE) 1 % injection 2 mL  . betamethasone acetate-betamethasone sodium phosphate (CELESTONE) injection 12 mg    Laterality: Right  Location/Site:  L5-S1  Needle size: 22 G  Needle type: Spinal  Needle Placement: Transforaminal  Findings:  -Contrast Used: 1 mL iohexol 180 mg iodine/mL   -Comments: Excellent flow of contrast along the nerve and into the epidural  space.  Procedure Details: After squaring off the end-plates to get a true AP view, the C-arm was positioned so that an oblique view of the foramen as noted above was visualized. The target area is just inferior to the "nose of the scotty dog" or sub pedicular. The soft tissues overlying this structure were infiltrated with 2-3 ml. of 1% Lidocaine without Epinephrine.  The spinal needle was inserted toward the target using a "trajectory" view along the fluoroscope beam.  Under AP and lateral visualization, the needle was advanced so it did not puncture dura and was located close the 6 O'Clock position of the pedical in AP tracterory. Biplanar projections were used to confirm position. Aspiration was confirmed to be negative for CSF and/or blood. A 1-2 ml. volume of Isovue-250 was injected and flow of contrast was noted at each level. Radiographs were obtained for documentation purposes.   After attaining the desired flow of contrast documented above, a 0.5 to 1.0 ml test dose of 0.25% Marcaine was injected into each respective transforaminal space.  The patient was observed for 90 seconds post injection.  After no sensory deficits were reported, and normal lower extremity motor function was noted,   the above injectate was administered so that equal amounts of the injectate were placed at each foramen (level) into the transforaminal epidural space.   Additional Comments:  The patient tolerated the procedure well Dressing: Band-Aid    Post-procedure details: Patient was observed during the procedure. Post-procedure instructions were reviewed.  Patient left the clinic in stable condition.

## 2016-12-05 ENCOUNTER — Encounter (INDEPENDENT_AMBULATORY_CARE_PROVIDER_SITE_OTHER): Payer: Self-pay | Admitting: Physician Assistant

## 2016-12-05 ENCOUNTER — Ambulatory Visit (INDEPENDENT_AMBULATORY_CARE_PROVIDER_SITE_OTHER): Payer: 59 | Admitting: Physician Assistant

## 2016-12-05 DIAGNOSIS — M5440 Lumbago with sciatica, unspecified side: Secondary | ICD-10-CM | POA: Diagnosis not present

## 2016-12-05 NOTE — Progress Notes (Signed)
Mr. Sittner returns today for follow-up from a lumbar L5-S1 right transforaminal injection.  He states that about 80% of his pain is gone.  Still having some low back pain.  He is having no radicular symptoms down either leg he has never had radicular symptoms down either leg.  Overall very happy with the results.  Does ask if he can have another injection to hopefully get rid of the mid back pain that he is having.  Physical exam: Negative straight leg raise bilaterally.  Tenderness over the lower lumbar spine.  Impression: Low back pain.  Plan: We will send him back to Dr. Ernestina Patches for an injection at the next level higher at L4-5 for a right transforaminal injection.  Have him follow-up on an as-needed basis with Dr. Ninfa Linden and myself is.  Questions were encouraged and answered at length.

## 2016-12-07 ENCOUNTER — Other Ambulatory Visit (INDEPENDENT_AMBULATORY_CARE_PROVIDER_SITE_OTHER): Payer: Self-pay

## 2016-12-07 DIAGNOSIS — M5441 Lumbago with sciatica, right side: Principal | ICD-10-CM

## 2016-12-07 DIAGNOSIS — G8929 Other chronic pain: Secondary | ICD-10-CM

## 2016-12-27 ENCOUNTER — Ambulatory Visit (INDEPENDENT_AMBULATORY_CARE_PROVIDER_SITE_OTHER): Payer: Self-pay

## 2016-12-27 ENCOUNTER — Encounter (INDEPENDENT_AMBULATORY_CARE_PROVIDER_SITE_OTHER): Payer: Self-pay | Admitting: Physical Medicine and Rehabilitation

## 2016-12-27 ENCOUNTER — Ambulatory Visit (INDEPENDENT_AMBULATORY_CARE_PROVIDER_SITE_OTHER): Payer: 59 | Admitting: Physical Medicine and Rehabilitation

## 2016-12-27 VITALS — BP 120/79 | HR 81 | Temp 98.3°F

## 2016-12-27 DIAGNOSIS — M5116 Intervertebral disc disorders with radiculopathy, lumbar region: Secondary | ICD-10-CM | POA: Diagnosis not present

## 2016-12-27 MED ORDER — LIDOCAINE HCL (PF) 1 % IJ SOLN
2.0000 mL | Freq: Once | INTRAMUSCULAR | Status: AC
  Administered 2016-12-27: 2 mL

## 2016-12-27 MED ORDER — METHYLPREDNISOLONE ACETATE 80 MG/ML IJ SUSP
80.0000 mg | Freq: Once | INTRAMUSCULAR | Status: AC
  Administered 2016-12-27: 80 mg

## 2016-12-27 NOTE — Patient Instructions (Signed)

## 2016-12-27 NOTE — Progress Notes (Deleted)
Aching in low back, if he has to choose a side it would be more right + Driver, -Bt, -Dye allergy

## 2017-01-11 NOTE — Procedures (Signed)
[name] [name] is a pleasant 62 year old gentleman followed by Benita Stabile, PA-C.  We recently completed a right L5 transforaminal epidural steroid injection with probably 80% relief of his hip pain.  He is never had any full radicular pain down the leg.  MRI is again reviewed showing a pretty significant disc extrusion at L4-5 with moderate multifactorial stenosis.  He has an area at L2-3 with more right lateral disc herniation.  His pain is really midline low back pain.  We are going to complete a general interlaminar epidural injection just to see if we get more spread of medication into the area and these 2 levels and just see how he does with that.  Over time he should see some regression of the disc extrusion.  He may ultimately be a candidate for facet joint blocks and possible radiofrequency ablation.  Lumbar Epidural Steroid Injection - Interlaminar Approach with Fluoroscopic Guidance  Patient: [name] [name]      Date of Birth: [birth year] MRN: [id number] PCP: Tanda Rockers, MD      Visit Date: 12/27/2016   Universal Protocol:     Consent Given By: the patient  Position: PRONE  Additional Comments: Vital signs were monitored before and after the procedure. Patient was prepped and draped in the usual sterile fashion. The correct patient, procedure, and site was verified.   Injection Procedure Details:  Procedure Site One Meds Administered:  Meds ordered this encounter  Medications  . lidocaine (PF) (XYLOCAINE) 1 % injection 2 mL  . methylPREDNISolone acetate (DEPO-MEDROL) injection 80 mg     Laterality: Right  Location/Site:  L4-L5  Needle size: 20 G  Needle type: Tuohy  Needle Placement: Paramedian epidural  Findings:   -Comments: Excellent flow of contrast into the epidural space.  Procedure Details: Using a paramedian approach from the side mentioned above, the region overlying the inferior lamina was localized under fluoroscopic visualization and the soft  tissues overlying this structure were infiltrated with 4 ml. of 1% Lidocaine without Epinephrine. The Tuohy needle was inserted into the epidural space using a paramedian approach.   The epidural space was localized using loss of resistance along with lateral and bi-planar fluoroscopic views.  After negative aspirate for air, blood, and CSF, a 2 ml. volume of Isovue-250 was injected into the epidural space and the flow of contrast was observed. Radiographs were obtained for documentation purposes.    The injectate was administered into the level noted above.   Additional Comments:  The patient tolerated the procedure well Dressing: Band-Aid    Post-procedure details: Patient was observed during the procedure. Post-procedure instructions were reviewed.  Patient left the clinic in stable condition.  Pertinent Imaging: MRI LUMBAR SPINE WITHOUT CONTRAST  TECHNIQUE: Multiplanar, multisequence MR imaging of the lumbar spine was performed. No intravenous contrast was administered.  COMPARISON:  Prior radiograph from 10/25/2016.  FINDINGS: Segmentation: Normal segmentation. Lowest well-formed disc labeled the L5-S1 level.  Alignment: Normal alignment with preservation of the normal lumbar lordosis. No listhesis.  Vertebrae: Vertebral body heights maintained. No evidence for acute or chronic fracture. Bone marrow signal intensity within normal limits. No discrete or worrisome osseous lesions. No abnormal marrow edema.  Conus medullaris: Extends to the T12 level and appears normal.  Paraspinal and other soft tissues: Paraspinous soft tissues within normal limits. Visualized visceral structures are normal.  Disc levels:  L1-2:  Unremarkable.  L2-3: Mild diffuse disc bulge with disc desiccation. Superimposed right foraminal/far lateral disc protrusion with associated annular fissure (Series  6, image 10). Protruding disc contacts the exiting right L2 nerve root as it  courses out of the neural foramen. Mild facet ligamentum flavum hypertrophy. No significant canal or foraminal stenosis.  L3-4: Mild diffuse disc bulge with disc desiccation. Mild facet and ligamentum flavum hypertrophy, greater on the left. No stenosis or impingement.  L4-5: Disc desiccation with mild disc bulge. Superimposed right subarticular disc extrusion with slight superior migration (series 4, image 6). Superior migration measures approximately 6 mm relative to the parent L4-5 interspace. Disc extrusion itself measures approximately in 14 mm. Extruded disc contacts and displaces the descending right L5 nerve root (series 6, image 22). Mild to moderate canal stenosis. Foramina remain patent.  L5-S1: Diffuse disc bulge with intervertebral disc space narrowing. Mild chronic reactive endplate changes with marginal endplate osteophytic spurring. Superimposed central disc protrusion indents the ventral thecal sac, closely approximating the descending S1 nerve roots without impingement or displacement (series 7, image 29). Superimposed epidural lipomatosis. No significant stenosis.  IMPRESSION: 1. Right subarticular disc extrusion at L4-5 with secondary right L5 nerve root impingement. 2. Shallow right foraminal/extraforaminal disc protrusion at L2-3, contacting and potentially irritating the exiting right L2 nerve root. 3. Central disc protrusion at L5-S1, closely approximating the descending S1 nerve roots without neural impingement or displacement.   Electronically Signed   By: Jeannine Boga M.D.   On: 11/02/2016 17:50

## 2017-02-18 ENCOUNTER — Other Ambulatory Visit: Payer: Self-pay | Admitting: Cardiovascular Disease

## 2017-04-05 ENCOUNTER — Encounter: Payer: Self-pay | Admitting: Internal Medicine

## 2017-04-05 ENCOUNTER — Ambulatory Visit (INDEPENDENT_AMBULATORY_CARE_PROVIDER_SITE_OTHER): Payer: BLUE CROSS/BLUE SHIELD | Admitting: Internal Medicine

## 2017-04-05 ENCOUNTER — Other Ambulatory Visit (INDEPENDENT_AMBULATORY_CARE_PROVIDER_SITE_OTHER): Payer: BLUE CROSS/BLUE SHIELD

## 2017-04-05 VITALS — BP 130/80 | HR 87 | Ht 69.0 in | Wt 207.0 lb

## 2017-04-05 DIAGNOSIS — I10 Essential (primary) hypertension: Secondary | ICD-10-CM | POA: Diagnosis not present

## 2017-04-05 DIAGNOSIS — M545 Low back pain, unspecified: Secondary | ICD-10-CM

## 2017-04-05 DIAGNOSIS — Z0000 Encounter for general adult medical examination without abnormal findings: Secondary | ICD-10-CM | POA: Diagnosis not present

## 2017-04-05 DIAGNOSIS — Z23 Encounter for immunization: Secondary | ICD-10-CM

## 2017-04-05 LAB — URINALYSIS
Bilirubin Urine: NEGATIVE
Hgb urine dipstick: NEGATIVE
Ketones, ur: NEGATIVE
Leukocytes, UA: NEGATIVE
Nitrite: NEGATIVE
Specific Gravity, Urine: 1.02 (ref 1.000–1.030)
Total Protein, Urine: NEGATIVE
Urine Glucose: NEGATIVE
Urobilinogen, UA: 0.2 (ref 0.0–1.0)
pH: 6.5 (ref 5.0–8.0)

## 2017-04-05 MED ORDER — CYCLOBENZAPRINE HCL 10 MG PO TABS: 10.0000 mg | ORAL_TABLET | Freq: Four times a day (QID) | ORAL | 2 refills | Status: DC | PRN

## 2017-04-05 NOTE — Progress Notes (Signed)
Patient ID: [name] [name], male   DOB: [birth year]   MRN: [id number]   Brief patient profile:  40 yobm quit smoking in early 1980's with hypertension hyperlipidemia and atypical chest pain with a left heart catheter showing 60% obstruction of the right-sided branches in the 1999 and a negative stress study in May of 2007> cabg required 10/2010    History of Present Illness  08/12/2010 ov/[name] [name]  Cc new onset cp comes and goes x 1 week so went to ER July 30th with neg w/u and much less cp since er.  Location = Left of sternum only, aching but   No relation to activity, lasts no more than a few seconds, not noticeable supine or sleeping.  Does ok with exercise with no pains. rec Cardiac eval    DATE OF ADMISSION: 10/18/2010  DATE OF DISCHARGE: 10/24/2010  DISCHARGE SUMMARY  PRIMARY ADMITTING DIAGNOSIS: Chest pain.  ADDITIONAL/DISCHARGE DIAGNOSES:  1. Coronary artery disease.  2. New-onset angina.  3. Hypertension.  4. Hyperlipidemia.  5. Remote history of tobacco abuse.  6. Postoperative acute blood loss anemia.  PROCEDURES PERFORMED:  1. Cardiac catheterization.  2. Coronary artery bypass grafting x4 (left internal mammary artery to  the LAD, saphenous vein graft to the second diagonal, saphenous  vein graft to the first obtuse marginal, saphenous vein graft to  the posterior descending).  3. Endoscopic vein harvest, right leg  04/04/2011 f/u ov/[name] [name] cc new L lat localized hip pain worse standing up, 3-4 weeks comes and goes, not tried nsaids, maybe worse toward end of day rec Try advil up to 3 with meals to see if helps hip pain and if not better within a week call for referral to an orthopedic surgeon> referred     09/02/2016 acute extended ov/[name] [name] re: neck pain  Chief Complaint  Patient presents with  . Acute Visit    Pt c/o feeling anxious recently. He states he has also noticed some dizziness when he bends over.  He states he has also noticed some soreness in the left side of  his neck for the past.   new onset  10 days positional  L neck shoots into L post shoulder  Tylenol helps/ heat helps / no pain with motion of L shoulder/ no numbness or weakness of arm or hand on L  Overdue for cpx  Not getting any ex at all but with ex denies sob or cp  rec Neck stretching x 6 motions, hold each one x 30 secs and repeat as frequently as you can but at least sev times a day especially at bedtime and on rising in am  Heat/ advil 200 up 3 with meals as needed (9 max per 24h)     10/24/16  Back injury at home leaning over to get something out of car > midline low back pain>  Ortho > shots > no more need for flex/advil     04/05/2017 acute  ov/[name] [name] re: new back pain in pt with hbp Chief Complaint  Patient presents with  . Acute Visit    shrap pain in left side for 1 week now   new left sided back pain acute onset x  two weeks prior to OV  When bending over to reach for luggage  Positional /better on lying down or sitting still  Started advil 3 x day some better No radicular features/ rash/ pain with cough or change with urination /bms Other back pain better p injection of pain at ortho  office    No obvious day to day or daytime variability or assoc excess/ purulent sputum or mucus plugs or hemoptysis or cp or chest tightness, subjective wheeze or overt sinus or hb symptoms. No unusual exposure hx or h/o childhood pna/ asthma or knowledge of premature birth.  Sleeping ok flat without nocturnal  or early am exacerbation  of respiratory  c/o's or need for noct saba. Also denies any obvious fluctuation of symptoms with weather or environmental changes or other aggravating or alleviating factors except as outlined above   Current Allergies, Complete Past Medical History, Past Surgical History, Family History, and Social History were reviewed in [employer] [employer] record.  ROS  The following are not active complaints unless bolded Hoarseness, sore throat,  dysphagia, dental problems, itching, sneezing,  nasal congestion or discharge of excess mucus or purulent secretions, ear ache,   fever, chills, sweats, unintended wt loss or wt gain, classically pleuritic or exertional cp,  orthopnea pnd or leg swelling, presyncope, palpitations, abdominal pain, anorexia, nausea, vomiting, diarrhea  or change in bowel habits or change in bladder habits, change in stools or change in urine, dysuria, hematuria,  rash, arthralgias, visual complaints, headache, numbness, weakness or ataxia or problems with walking or coordination,  change in mood/affect or memory.        Current Meds  Medication Sig  . acetaminophen (TYLENOL) 650 MG CR tablet Take 650 mg by mouth every 8 (eight) hours as needed for pain.  Marland Kitchen aspirin 325 MG tablet Take 325 mg by mouth daily.  Marland Kitchen losartan-hydrochlorothiazide (HYZAAR) 100-25 MG tablet Take 1 tablet by mouth daily. Please make overdue yearly appt with Dr. Johnsie Cancel before anymore refills. 1st attempt  . metoprolol tartrate (LOPRESSOR) 25 MG tablet TAKE 1 TABLET (25 MG TOTAL) BY MOUTH 2 (TWO) TIMES DAILY.  . nitroGLYCERIN (NITROSTAT) 0.4 MG SL tablet Place 1 tablet (0.4 mg total) under the tongue every 5 (five) minutes as needed for chest pain.  Marland Kitchen ondansetron (ZOFRAN ODT) 8 MG disintegrating tablet Take 1 tablet (8 mg total) by mouth every 8 (eight) hours as needed for nausea or vomiting.  . predniSONE (STERAPRED UNI-PAK 21 TAB) 10 MG (21) TBPK tablet Take as directed  . simvastatin (ZOCOR) 40 MG tablet TAKE 1 TABLET BY MOUTH AT BEDTIME  . tiZANidine (ZANAFLEX) 4 MG tablet Take 1 tablet (4 mg total) by mouth every 6 (six) hours as needed for muscle spasms.                       Past Medical History from [facility]: HYPERTENSION, BENIGN ESSENTIAL, CONTROLLED (ICD-401.1)  OBESITY  - Target wt = 190 for BMI < 30  Colon  polyps....................................................................................................[name]  - colonoscopy 4/04, letter sent 05/15/07 reviewed with pt July 17, 2008 and August 03, 2009 > referred back to GI  CHEST PAIN (ICD-786.50)  - s/p cabg 10/19/2010 HYPERLIPIDEMIA  - target LDL less than 70 based on documented ischemic heart disease  HEALTH MAINTENANCE..........................................................................................[name]  - TD 04/05/2017  - CPX  05/25/2015      Family History:  hypertension in mother and father no premature heart disease in any direct relatives nor stroke diabetes or cancer, except that his mother has developed leukemia  1 brother healthy  Neg prostate ca   Social History:  quit smoking about 1983 denies alcohol use. Works at a [employer] home  Objective:   Physical Exam  amb bm nad  wt 201 November 20, 2007 > 205 April 18, 2008 > 184 01/03/2011 > 182 04/04/2011 > 07/04/2011  185  >189 09/19/2011 > 01/02/2012 194 >204 02/09/2012 >196 04/04/2012 > 04/25/2012 194 > 11/02/2012  193 > 01/19/2013  190 >  05/23/2014  193 > 05/25/2015 196 > 09/02/2016   200   > 04/05/2017   207    Vital signs reviewed - Note on arrival 02 sats  97% on RA  And bp 130/80 with no Aortic enlargement   HEENT: nl dentition, turbinates bilaterally, and oropharynx. Nl external ear canals without cough reflex   NECK :  without JVD/Nodes/TM/ nl carotid upstrokes bilaterally   LUNGS: no acc muscle use,  Nl contour chest which is clear to A and P bilaterally without cough on insp or exp maneuvers   CV:  RRR  no s3 or murmur or increase in P2, and no edema   ABD:  soft and nontender with nl inspiratory excursion in the supine position. No bruits or organomegaly appreciated, bowel sounds nl  MS:  Nl gait/ ext warm without deformities, calf tenderness, cyanosis or clubbing No obvious joint restrictions / neg slr/ no tenderness  over back muscles   SKIN: warm and dry without lesions    NEURO:  alert, approp, nl sensorium with  no motor or cerebellar deficits apparent/ nl relflexes           Labs ordered 04/05/2017  U/a > neg               Assessment:

## 2017-04-05 NOTE — Patient Instructions (Addendum)
Tdap today   flexoril 10 mg up to 4 x daily as needed for muscle spasm  Advil 3-4 with meals as needed for pain (= up to 12 per day)   Heating pad may help  Call your orthopedic doctor for follow up   Please remember to go to the lab department downstairs in the basement  for your tests - we will call you with the results when they are available.      Please schedule a follow up office visit in 6 weeks, call sooner if needed with cpx on return

## 2017-04-06 ENCOUNTER — Encounter: Payer: Self-pay | Admitting: Internal Medicine

## 2017-04-06 LAB — URINE CULTURE
MICRO NUMBER:: 90382519
Result:: NO GROWTH
SPECIMEN QUALITY:: ADEQUATE

## 2017-04-06 NOTE — Assessment & Plan Note (Signed)
Due Tdap today and cpx May 24 2017

## 2017-04-06 NOTE — Assessment & Plan Note (Signed)
Pt has had recurrent them of neck and back pain that he continues to fail to treat conservatively correctly and focus on "what else it could be" other than ms pain to point of moderately severe anxiety  Spent extra time today reviewing how to to ms pain from internal pains (positional changes key) and advised on approp short term nsaids rx pending return to ortho  I had an extended discussion with the patient reviewing all relevant studies completed to date and  lasting 15 to 20 minutes of a 25 minute acute office visit    Each maintenance medication was reviewed in detail including most importantly the difference between maintenance and prns and under what circumstances the prns are to be triggered using an action plan format that is not reflected in the computer generated alphabetically organized AVS.    Please see AVS for specific instructions unique to this visit that I personally wrote and verbalized to the the pt in detail and then reviewed with pt  by my nurse highlighting any  changes in therapy recommended at today's visit to their plan of care.

## 2017-04-06 NOTE — Assessment & Plan Note (Signed)
Adequate control on present rx, reviewed in detail with pt > no change in rx needed  > cpx due in may 2019

## 2017-04-07 NOTE — Progress Notes (Signed)
LMTCB x1 on preferred phone number listed for patient.  

## 2017-04-10 ENCOUNTER — Telehealth: Payer: Self-pay | Admitting: Internal Medicine

## 2017-04-10 NOTE — Telephone Encounter (Signed)
Notes recorded by Tanda Rockers, MD on 04/07/2017 at 5:24 AM EDT Call patient : Study is unremarkable, no change in recs ---------------------------- Spoke with pt. He is aware of results. Nothing further was needed.

## 2017-05-17 ENCOUNTER — Other Ambulatory Visit: Payer: Self-pay | Admitting: Internal Medicine

## 2017-05-18 ENCOUNTER — Encounter: Payer: Self-pay | Admitting: Internal Medicine

## 2017-05-18 ENCOUNTER — Ambulatory Visit (INDEPENDENT_AMBULATORY_CARE_PROVIDER_SITE_OTHER): Payer: BLUE CROSS/BLUE SHIELD | Admitting: Internal Medicine

## 2017-05-18 VITALS — BP 126/72 | HR 57 | Ht 69.0 in | Wt 204.0 lb

## 2017-05-18 DIAGNOSIS — Z0000 Encounter for general adult medical examination without abnormal findings: Secondary | ICD-10-CM | POA: Diagnosis not present

## 2017-05-18 DIAGNOSIS — I10 Essential (primary) hypertension: Secondary | ICD-10-CM | POA: Diagnosis not present

## 2017-05-18 DIAGNOSIS — M545 Low back pain, unspecified: Secondary | ICD-10-CM

## 2017-05-18 NOTE — Patient Instructions (Signed)
Return end of August fasting for  CPX

## 2017-05-18 NOTE — Assessment & Plan Note (Addendum)
Resolved p sev days flex/ heat> f/u ortho prn

## 2017-05-18 NOTE — Assessment & Plan Note (Signed)
Due after 09/02/17 > work on wt with diet/ex

## 2017-05-18 NOTE — Assessment & Plan Note (Signed)
Adequate control on present rx, reviewed in detail with pt > no change in rx needed  > fasting labs/ cpx 3 m

## 2017-05-18 NOTE — Progress Notes (Signed)
Patient ID: [name] [name], male   DOB: Nov 19, 1955   MRN: [id number]   Brief patient profile:  68 yobm quit smoking in early 1980's with hypertension hyperlipidemia and atypical chest pain with a left heart catheter showing 60% obstruction of the right-sided branches in the 1999 and a negative stress study in May of 2007> cabg required 10/2010    History of Present Illness  08/12/2010 ov/[name] [name]  Cc new onset cp comes and goes x 1 week so went to ER July 30th with neg w/u and much less cp since er.  Location = Left of sternum only, aching but   No relation to activity, lasts no more than a few seconds, not noticeable supine or sleeping.  Does ok with exercise with no pains. rec Cardiac eval    DATE OF ADMISSION: 10/18/2010  DATE OF DISCHARGE: 10/24/2010  DISCHARGE SUMMARY  PRIMARY ADMITTING DIAGNOSIS: Chest pain.  ADDITIONAL/DISCHARGE DIAGNOSES:  1. Coronary artery disease.  2. New-onset angina.  3. Hypertension.  4. Hyperlipidemia.  5. Remote history of tobacco abuse.  6. Postoperative acute blood loss anemia.  PROCEDURES PERFORMED:  1. Cardiac catheterization.  2. Coronary artery bypass grafting x4 (left internal mammary artery to  the LAD, saphenous vein graft to the second diagonal, saphenous  vein graft to the first obtuse marginal, saphenous vein graft to  the posterior descending).  3. Endoscopic vein harvest, right leg  04/04/2011 f/u ov/[name] [name] cc new L lat localized hip pain worse standing up, 3-4 weeks comes and goes, not tried nsaids, maybe worse toward end of day rec Try advil up to 3 with meals to see if helps hip pain and if not better within a week call for referral to an orthopedic surgeon> referred     09/02/2016 acute extended ov/[name] [name] re: neck pain  Chief Complaint  Patient presents with  . Acute Visit    Pt c/o feeling anxious recently. He states he has also noticed some dizziness when he bends over.  He states he has also noticed some soreness in the left side of  his neck for the past.   new onset  10 days positional  L neck shoots into L post shoulder  Tylenol helps/ heat helps / no pain with motion of L shoulder/ no numbness or weakness of arm or hand on L  Overdue for cpx  Not getting any ex at all but with ex denies sob or cp  rec Neck stretching x 6 motions, hold each one x 30 secs and repeat as frequently as you can but at least sev times a day especially at bedtime and on rising in am  Heat/ advil 200 up 3 with meals as needed (9 max per 24h)     10/24/16  Back injury at home leaning over to get something out of car > midline low back pain>  Ortho > shots > no more need for flex/advil     04/05/2017 acute  ov/[name] [name] re: new back pain in pt with hbp Chief Complaint  Patient presents with  . Acute Visit    shrap pain in left side for 1 week now   new left sided back pain acute onset x  two weeks prior to OV  When bending over to reach for luggage  Positional /better on lying down or sitting still  Started advil 3 x day some better No radicular features/ rash/ pain with cough or change with urination /bms Other back pain better p injection of pain at ortho  office  rec Tdap today  flexoril 10 mg up to 4 x daily as needed for muscle spasm Advil 3-4 with meals as needed for pain (= up to 12 per day)  Heating pad may help Call your orthopedic doctor for follow up     05/18/2017  f/u ov/[name] [name] re:  Hbp/ f/u cp  Chief Complaint  Patient presents with  . Annual Exam    Doing well and no co's.    cp completely resolved 2-3 days p last ov p rx  flexoril / heating pad,  Min need for advil then and no longer on advil  Not limited by breathing from desired activities    No obvious day to day or daytime variability or assoc excess/ purulent sputum or mucus plugs or hemoptysis or cp or chest tightness, subjective wheeze or overt sinus or hb symptoms. No unusual exposure hx or h/o childhood pna/ asthma or knowledge of premature birth.  Sleeping  Fine  flat  without nocturnal  or early am exacerbation  of respiratory  c/o's or need for noct saba. Also denies any obvious fluctuation of symptoms with weather or environmental changes or other aggravating or alleviating factors except as outlined above   Current Allergies, Complete Past Medical History, Past Surgical History, Family History, and Social History were reviewed in [employer] record.  ROS  The following are not active complaints unless bolded Hoarseness, sore throat, dysphagia, dental problems, itching, sneezing,  nasal congestion or discharge of excess mucus or purulent secretions, ear ache,   fever, chills, sweats, unintended wt loss or wt gain, classically pleuritic or exertional cp,  orthopnea pnd or arm/hand swelling  or leg swelling, presyncope, palpitations, abdominal pain, anorexia, nausea, vomiting, diarrhea  or change in bowel habits or change in bladder habits, change in stools or change in urine, dysuria, hematuria,  rash, arthralgias, visual complaints, headache, numbness, weakness or ataxia or problems with walking or coordination,  change in mood or  memory.        Current Meds  Medication Sig  . acetaminophen (TYLENOL) 650 MG CR tablet Take 650 mg by mouth every 8 (eight) hours as needed for pain.  Marland Kitchen aspirin 325 MG tablet Take 325 mg by mouth daily.  . cyclobenzaprine (FLEXERIL) 10 MG tablet Take 1 tablet (10 mg total) by mouth 4 (four) times daily as needed for muscle spasms.  Marland Kitchen losartan-hydrochlorothiazide (HYZAAR) 100-25 MG tablet Take 1 tablet by mouth daily. Please make overdue yearly appt with Dr. Johnsie Cancel before anymore refills. 1st attempt  . methocarbamol (ROBAXIN) 500 MG tablet Take 1 tablet (500 mg total) by mouth every 6 (six) hours as needed for muscle spasms.  . metoprolol tartrate (LOPRESSOR) 25 MG tablet TAKE 1 TABLET BY MOUTH TWICE A DAY  . nitroGLYCERIN (NITROSTAT) 0.4 MG SL tablet Place 1 tablet (0.4 mg total) under the tongue every 5  (five) minutes as needed for chest pain.  Marland Kitchen ondansetron (ZOFRAN ODT) 8 MG disintegrating tablet Take 1 tablet (8 mg total) by mouth every 8 (eight) hours as needed for nausea or vomiting.  . simvastatin (ZOCOR) 40 MG tablet TAKE 1 TABLET BY MOUTH AT BEDTIME  . tiZANidine (ZANAFLEX) 4 MG tablet Take 1 tablet (4 mg total) by mouth every 6 (six) hours as needed for muscle spasms.                           Past Medical History from [facility]: HYPERTENSION,  BENIGN ESSENTIAL, CONTROLLED (ICD-401.1)  OBESITY  - Target wt = 190 for BMI < 30  Colon polyps....................................................................................................[name]  - colonoscopy 4/04, letter sent 05/15/07 reviewed with pt July 17, 2008 and August 03, 2009 > referred back to GI  CHEST PAIN (ICD-786.50)  - s/p cabg 10/19/2010 HYPERLIPIDEMIA  - target LDL less than 70 based on documented ischemic heart disease  HEALTH MAINTENANCE..........................................................................................[name]  - TD 04/05/2017  - CPX  05/25/2015      Family History:  hypertension in mother and father no premature heart disease in any direct relatives nor stroke diabetes or cancer, except that his mother has developed leukemia  1 brother healthy  Neg prostate ca     Social History:  quit smoking about 1983 denies alcohol use. Works at a [employer] home                      Objective:   Physical Exam  Amb bm nad   wt 201 November 20, 2007 > 205 April 18, 2008 > 184 01/03/2011 > 182 04/04/2011 > 07/04/2011  185  >189 09/19/2011 > 01/02/2012 194 >204 02/09/2012 >196 04/04/2012 > 04/25/2012 194 > 11/02/2012  193 > 01/19/2013  190 >  05/23/2014  193 > 05/25/2015 196 > 09/02/2016   200   > 04/05/2017   207 > 05/18/2017  99% RA   Vital signs reviewed - Note on arrival 02 sats  99% on RA and bp 126/72       No jvd Oropharynx clear Neck supple Lungs clear bilaterally to A and  P  RRR no s3 or or sign murmur Abd soft/ non tender/ ok excursion on isp Ext wam with no edema or clubbing noted Neuro  Alert/ No motor deficits         u/a neg 04/05/17       Assessment:

## 2017-06-06 ENCOUNTER — Other Ambulatory Visit: Payer: Self-pay | Admitting: Internal Medicine

## 2017-07-17 ENCOUNTER — Other Ambulatory Visit: Payer: Self-pay | Admitting: Internal Medicine

## 2017-07-20 ENCOUNTER — Encounter: Payer: Self-pay | Admitting: Nurse Practitioner

## 2017-08-08 ENCOUNTER — Ambulatory Visit (INDEPENDENT_AMBULATORY_CARE_PROVIDER_SITE_OTHER): Payer: BLUE CROSS/BLUE SHIELD | Admitting: Nurse Practitioner

## 2017-08-08 ENCOUNTER — Telehealth (HOSPITAL_COMMUNITY): Payer: Self-pay | Admitting: *Deleted

## 2017-08-08 ENCOUNTER — Encounter (INDEPENDENT_AMBULATORY_CARE_PROVIDER_SITE_OTHER): Payer: Self-pay

## 2017-08-08 ENCOUNTER — Encounter: Payer: Self-pay | Admitting: Nurse Practitioner

## 2017-08-08 VITALS — BP 150/100 | HR 53 | Ht 68.0 in | Wt 205.0 lb

## 2017-08-08 DIAGNOSIS — I2583 Coronary atherosclerosis due to lipid rich plaque: Secondary | ICD-10-CM | POA: Diagnosis not present

## 2017-08-08 DIAGNOSIS — I10 Essential (primary) hypertension: Secondary | ICD-10-CM | POA: Diagnosis not present

## 2017-08-08 DIAGNOSIS — I2510 Atherosclerotic heart disease of native coronary artery without angina pectoris: Secondary | ICD-10-CM

## 2017-08-08 DIAGNOSIS — I6523 Occlusion and stenosis of bilateral carotid arteries: Secondary | ICD-10-CM

## 2017-08-08 DIAGNOSIS — R0789 Other chest pain: Secondary | ICD-10-CM | POA: Diagnosis not present

## 2017-08-08 DIAGNOSIS — E7849 Other hyperlipidemia: Secondary | ICD-10-CM | POA: Diagnosis not present

## 2017-08-08 LAB — CBC
Hematocrit: 36.6 % — ABNORMAL LOW (ref 37.5–51.0)
Hemoglobin: 12.5 g/dL — ABNORMAL LOW (ref 13.0–17.7)
MCH: 30.6 pg (ref 26.6–33.0)
MCHC: 34.2 g/dL (ref 31.5–35.7)
MCV: 90 fL (ref 79–97)
Platelets: 246 10*3/uL (ref 150–450)
RBC: 4.09 x10E6/uL — ABNORMAL LOW (ref 4.14–5.80)
RDW: 13.6 % (ref 12.3–15.4)
WBC: 4.9 10*3/uL (ref 3.4–10.8)

## 2017-08-08 LAB — HEPATIC FUNCTION PANEL
ALT: 37 IU/L (ref 0–44)
AST: 30 IU/L (ref 0–40)
Albumin: 4.7 g/dL (ref 3.6–4.8)
Alkaline Phosphatase: 43 IU/L (ref 39–117)
Bilirubin Total: 0.3 mg/dL (ref 0.0–1.2)
Bilirubin, Direct: 0.12 mg/dL (ref 0.00–0.40)
Total Protein: 6.7 g/dL (ref 6.0–8.5)

## 2017-08-08 LAB — LIPID PANEL
Chol/HDL Ratio: 3.9 ratio (ref 0.0–5.0)
Cholesterol, Total: 140 mg/dL (ref 100–199)
HDL: 36 mg/dL — ABNORMAL LOW (ref >39)
LDL Calculated: 85 mg/dL (ref 0–99)
Triglycerides: 97 mg/dL (ref 0–149)
VLDL Cholesterol Cal: 19 mg/dL (ref 5–40)

## 2017-08-08 LAB — BASIC METABOLIC PANEL
BUN/Creatinine Ratio: 20 (ref 10–24)
BUN: 21 mg/dL (ref 8–27)
CO2: 25 mmol/L (ref 20–29)
Calcium: 9.3 mg/dL (ref 8.6–10.2)
Chloride: 100 mmol/L (ref 96–106)
Creatinine, Ser: 1.06 mg/dL (ref 0.76–1.27)
GFR calc Af Amer: 87 mL/min/{1.73_m2} (ref >59)
GFR calc non Af Amer: 75 mL/min/{1.73_m2} (ref >59)
Glucose: 107 mg/dL — ABNORMAL HIGH (ref 65–99)
Potassium: 4.1 mmol/L (ref 3.5–5.2)
Sodium: 140 mmol/L (ref 134–144)

## 2017-08-08 MED ORDER — ASPIRIN EC 81 MG PO TBEC: 81.0000 mg | DELAYED_RELEASE_TABLET | Freq: Every day | ORAL | Status: AC

## 2017-08-08 MED ORDER — NITROGLYCERIN 0.4 MG SL SUBL: 0.4000 mg | SUBLINGUAL_TABLET | SUBLINGUAL | 3 refills | Status: DC | PRN

## 2017-08-08 NOTE — Telephone Encounter (Signed)
Patient given detailed instructions per Myocardial Perfusion Study Information Sheet for the test on 08/10/17. Patient notified to arrive 15 minutes early and that it is imperative to arrive on time for appointment to keep from having the test rescheduled.  If you need to cancel or reschedule your appointment, please call the office within 24 hours of your appointment. . Patient verbalized understanding. Kirstie Peri

## 2017-08-08 NOTE — Patient Instructions (Addendum)
We will be checking the following labs today - BMET, CBC, HPF and lipids   Medication Instructions:    Continue with your current medicines.   I have refilled your NTG today  Please cut your aspirin back to 81 mg a day.     Testing/Procedures To Be Arranged:  Carotid dopplers  Stress Myoview  Follow-Up:   See Dr. Johnsie Cancel in one year    Other Special Instructions:   Try to restrict salt - less eating out  Monitor BP - goal is 130/85 or less - please call us or MyChart message in about a week or so with your readings - we may need to add additional medicines.   You are scheduled for a Myocardial Perfusion Imaging Study on _____________________________________ at __________________________________________________.   Please arrive 15 minutes prior to your appointment time for registration and insurance purposes.   The test will take approximately 3 to 4 hours to complete; you may bring reading material. If someone comes with you to your appointment, they will need to remain in the main lobby due to limited space in the testing area.   If you are pregnant or breastfeeding, please notify the nuclear lab prior to your appointment.   How to prepare for your Myocardial Perfusion test:   Do not eat or drink 3 hours prior to your test, except you may have water.    Do not consume products containing caffeine (regular or decaffeinated) 12 hours prior to your test (ex: coffee, chocolate, soda, tea)   Do bring a list of your current medications with you. If not listed below, you may take your medications as normal.    Do not take Metoprolol for 24 hours prior to the test.   Bring any held medication to your appointment, as you may be required to take it once the test is complete.   Do wear comfortable clothes (no dresses or overalls) and walking shoes. Tennis shoes are preferred. No heels or open toed shoes.  Do not wear cologne, perfume, aftershave or lotions (deodorant is  allowed).   If these instructions are not followed, you test will have to be rescheduled.   Please report to [address] for your test. If you have questions or concerns about your appointment, please call the Nuclear Lab at [phone].  If you cannot keep your appointment, please provide 24 hour notification to the Nuclear lab to avoid a possible $50 charge to your account.         If you need a refill on your cardiac medications before your next appointment, please call your pharmacy.   Call the [place] office at [phone] if you have any questions, problems or concerns.

## 2017-08-08 NOTE — Progress Notes (Signed)
CARDIOLOGY OFFICE NOTE  Date:  08/08/2017    [name] [name] Date of Birth: 10/05/1955 Medical Record #[id number]  PCP:  Tanda Rockers, MD  Cardiologist:  Johnsie Cancel    Chief Complaint  Patient presents with  . Coronary Artery Disease    Follow up visit - seen for Dr. Johnsie Cancel    History of Present Illness: [name] [name] is a 62 y.o. male who presents today for a follow up visit. Seen for Dr. Johnsie Cancel in January of 2018.   He has a history of known CAD with remote CABG x 4 in 2012 by [facility]. Other issues include HTN, HLD, carotid disease, past tobacco abuse and obesity.   Last seen here in January of 2018.   Comes in today. Here with his wife. Several issues. Needs NTG refilled. Remains on high dose aspirin. He has been trying to get back to a walking program - has noted some DOE with this. Some "tingle" in his chest - somewhat reminiscent of his symptoms prior to CABG. Wife notes some "deja vu" type symptoms as well that remind her of 7 years ago - some memory issues. He has not used NTG. His spells are fleeting. Not checking BP at home. BP is up here today. He is fasting. Not lightheaded or dizzy. No swelling. They do eat out considerably.   Past Medical History:  Diagnosis Date  . Arthritis   . Blood clot in vein    superficial / right leg following CABG  . CAD (coronary artery disease)   . Chest pain   . Colon polyps   . Complication of anesthesia    hard to wake up  . Essential hypertension, benign   . Headache    migraines as teenager  . Heart murmur   . History of blood transfusion   . History of kidney stones   . Hyperlipemia   . Obesity   . Tobacco abuse     Past Surgical History:  Procedure Laterality Date  . CARDIAC CATHETERIZATION    . CIRCUMCISION  1986  . CORONARY ARTERY BYPASS GRAFT    . Coronary artery bypass grafting x4  10/21/2010  . CYSTOSCOPY WITH RETROGRADE PYELOGRAM, URETEROSCOPY AND STENT PLACEMENT Left 09/01/2014   Procedure: CYSTOSCOPY  WITH RETROGRADE PYELOGRAM, URETEROSCOPY AND STENT PLACEMENT;  Surgeon: Irine Seal, MD;  Location: [facility];  Service: Urology;  Laterality: Left;  . CYSTOSCOPY WITH URETEROSCOPY AND STENT PLACEMENT Left 09/09/2014   Procedure: CYSTOSCOPY,LEFT URETEROSCOPY AND STENT PLACEMENT;  Surgeon: Irine Seal, MD;  Location: [facility];  Service: Urology;  Laterality: Left;  . HEMORRHOID SURGERY    . HOLMIUM LASER APPLICATION Left 08/22/7515   Procedure: WITH HOLMIUM LASER ;  Surgeon: Irine Seal, MD;  Location: [facility];  Service: Urology;  Laterality: Left;  . Stapled hemorrhoidopexy.  10/19/2006  . TOTAL HIP ARTHROPLASTY Left 05/29/2015   Procedure: LEFT TOTAL HIP ARTHROPLASTY ANTERIOR APPROACH;  Surgeon: Mcarthur Rossetti, MD;  Location: [facility];  Service: Orthopedics;  Laterality: Left;  Marland Kitchen VASECTOMY  1986     Medications: Current Meds  Medication Sig  . acetaminophen (TYLENOL) 650 MG CR tablet Take 650 mg by mouth every 8 (eight) hours as needed for pain.  . cyclobenzaprine (FLEXERIL) 10 MG tablet Take 1 tablet (10 mg total) by mouth 4 (four) times daily as needed for muscle spasms.  Marland Kitchen losartan-hydrochlorothiazide (HYZAAR) 100-25 MG tablet TAKE 1 TABLET BY MOUTH DAILY.  . methocarbamol (ROBAXIN) 500 MG tablet Take  1 tablet (500 mg total) by mouth every 6 (six) hours as needed for muscle spasms.  . metoprolol tartrate (LOPRESSOR) 25 MG tablet TAKE 1 TABLET BY MOUTH TWICE A DAY  . nitroGLYCERIN (NITROSTAT) 0.4 MG SL tablet Place 1 tablet (0.4 mg total) under the tongue every 5 (five) minutes as needed for chest pain.  Marland Kitchen ondansetron (ZOFRAN ODT) 8 MG disintegrating tablet Take 1 tablet (8 mg total) by mouth every 8 (eight) hours as needed for nausea or vomiting.  . simvastatin (ZOCOR) 40 MG tablet TAKE 1 TABLET BY MOUTH EVERYDAY AT BEDTIME  . tiZANidine (ZANAFLEX) 4 MG tablet Take 1 tablet (4 mg total) by mouth every 6 (six) hours as needed for muscle spasms.  . [DISCONTINUED] aspirin 325 MG tablet Take 325 mg  by mouth daily.  . [DISCONTINUED] nitroGLYCERIN (NITROSTAT) 0.4 MG SL tablet Place 1 tablet (0.4 mg total) under the tongue every 5 (five) minutes as needed for chest pain.     Allergies: Allergies  Allergen Reactions  . Codeine Other (See Comments)    migraines    Social History: The patient  reports that he quit smoking about 36 years ago. His smoking use included cigarettes. He has a 3.50 pack-year smoking history. He has never used smokeless tobacco. He reports that he does not drink alcohol or use drugs.   Family History: The patient's family history includes Hypertension in his father and mother; Leukemia in his mother.   Review of Systems: Please see the history of present illness.   Otherwise, the review of systems is positive for none.   All other systems are reviewed and negative.   Physical Exam: VS:  BP (!) 150/100 (BP Location: Left Arm, Patient Position: Sitting, Cuff Size: Normal)   Pulse (!) 53   Ht 5\' 8"  (1.727 m)   Wt 205 lb (93 kg)   BMI 31.17 kg/m  .  BMI Body mass index is 31.17 kg/m.  Wt Readings from Last 3 Encounters:  08/08/17 205 lb (93 kg)  05/18/17 204 lb (92.5 kg)  04/05/17 207 lb (93.9 kg)   Repeat BP by me is 150/100  General: Pleasant. Obese black male. He is alert and in no acute distress.   HEENT: Normal.  Neck: Supple, no JVD, carotid bruits, or masses noted.  Cardiac: Regular rate and rhythm. Soft outflow murmur.  No edema.  Respiratory:  Lungs are clear to auscultation bilaterally with normal work of breathing.  GI: Soft and nontender.  MS: No deformity or atrophy. Gait and ROM intact.  Skin: Warm and dry. Color is normal.  Neuro:  Strength and sensation are intact and no gross focal deficits noted.  Psych: Alert, appropriate and with normal affect.   LABORATORY DATA:  EKG:  EKG is ordered today. This demonstrates sinus bradycardia.  Lab Results  Component Value Date   WBC 5.8 09/02/2016   HGB 12.9 (L) 09/02/2016   HCT 38.3  (L) 09/02/2016   PLT 276.0 09/02/2016   GLUCOSE 106 (H) 09/02/2016   CHOL 127 09/02/2016   TRIG 86.0 09/02/2016   HDL 29.90 (L) 09/02/2016   LDLCALC 80 09/02/2016   ALT 29 09/02/2016   AST 25 09/02/2016   NA 140 09/02/2016   K 3.9 09/02/2016   CL 101 09/02/2016   CREATININE 1.02 09/02/2016   BUN 19 09/02/2016   CO2 32 09/02/2016   TSH 1.04 09/02/2016   PSA 0.50 09/02/2016   INR 1.02 05/22/2015   HGBA1C 6.4 (H) 10/18/2010  BNP (last 3 results) No results for input(s): BNP in the last 8760 hours.  ProBNP (last 3 results) No results for input(s): PROBNP in the last 8760 hours.   Other Studies Reviewed Today:  S/P CABG 10/19/10 Azai Gaffin  SURGICAL PROCEDURE: Coronary artery bypass grafting x4 with the left  internal mammary to the left anterior descending coronary artery,  reverse saphenous vein graft to the second diagonal coronary artery,  reverse saphenous vein graft to the first obtuse marginal, reverse  saphenous vein graft to the posterior descending coronary artery with  right leg endovein harvesting.    Assessment/Plan:  1. CAD with remote CABG in 2012 - some atypical symptoms - multiple risk factors - will arrange for stress Myoview. Cutting aspirin back to 81 mg a day. NTG is refilled per his request (not using).   2. HTN - BP is up here initially - I have asked him to monitor BP at home and send [place] an update - would probably add Norvasc to his regimen next.   3. HLD - on statin - no recent labs - will get today  4. Carotid disease - has 1 to 39% bilateral disease noted - was due to repeat study in 02/2016 - will go ahead and update  5. Obesity - he is trying to get back to walking.   Current medicines are reviewed with the patient today.  The patient does not have concerns regarding medicines other than what has been noted above.  The following changes have been made:  See above.  Labs/ tests ordered today include:    Orders Placed This Encounter    Procedures  . EKG 12-Lead     Disposition:   FU with Dr. Johnsie Cancel in one year as long as his testing is ok.     Patient is agreeable to this plan and will call if any problems develop in the interim.   SignedTruitt Merle, NP  08/08/2017 8:34 AM  [facility] [address] [place], [place]  [address] Phone: [phone] Fax: [phone]

## 2017-08-09 ENCOUNTER — Ambulatory Visit (HOSPITAL_COMMUNITY)
Admission: RE | Admit: 2017-08-09 | Discharge: 2017-08-09 | Disposition: A | Discharge status: Home / Self Care | Payer: BLUE CROSS/BLUE SHIELD | Source: Ambulatory Visit | Attending: Cardiovascular Disease | Admitting: Cardiovascular Disease

## 2017-08-09 DIAGNOSIS — I2583 Coronary atherosclerosis due to lipid rich plaque: Secondary | ICD-10-CM | POA: Diagnosis not present

## 2017-08-09 DIAGNOSIS — I6523 Occlusion and stenosis of bilateral carotid arteries: Secondary | ICD-10-CM | POA: Diagnosis not present

## 2017-08-09 DIAGNOSIS — I10 Essential (primary) hypertension: Secondary | ICD-10-CM | POA: Insufficient documentation

## 2017-08-09 DIAGNOSIS — I2510 Atherosclerotic heart disease of native coronary artery without angina pectoris: Secondary | ICD-10-CM | POA: Diagnosis not present

## 2017-08-09 DIAGNOSIS — E7849 Other hyperlipidemia: Secondary | ICD-10-CM | POA: Diagnosis not present

## 2017-08-10 ENCOUNTER — Ambulatory Visit (HOSPITAL_COMMUNITY): Payer: BLUE CROSS/BLUE SHIELD | Attending: Cardiovascular Disease

## 2017-08-10 DIAGNOSIS — I2583 Coronary atherosclerosis due to lipid rich plaque: Secondary | ICD-10-CM | POA: Diagnosis not present

## 2017-08-10 DIAGNOSIS — I10 Essential (primary) hypertension: Secondary | ICD-10-CM | POA: Insufficient documentation

## 2017-08-10 DIAGNOSIS — I2510 Atherosclerotic heart disease of native coronary artery without angina pectoris: Secondary | ICD-10-CM | POA: Insufficient documentation

## 2017-08-10 DIAGNOSIS — E7849 Other hyperlipidemia: Secondary | ICD-10-CM | POA: Insufficient documentation

## 2017-08-10 DIAGNOSIS — R0789 Other chest pain: Secondary | ICD-10-CM | POA: Diagnosis not present

## 2017-08-10 LAB — MYOCARDIAL PERFUSION IMAGING
Estimated workload: 10.1 METS
Exercise duration (min): 8 min
Exercise duration (sec): 0 s
LV dias vol: 89 mL (ref 62–150)
LV sys vol: 34 mL
MPHR: 158 {beats}/min
Peak HR: 150 {beats}/min
Percent HR: 94 %
RATE: 0.34
Rest HR: 60 {beats}/min
SDS: 0
SRS: 6
SSS: 6
TID: 0.92

## 2017-08-10 MED ORDER — TECHNETIUM TC 99M TETROFOSMIN IV KIT
32.6000 | PACK | Freq: Once | INTRAVENOUS | Status: AC | PRN
  Administered 2017-08-10: 32.6 via INTRAVENOUS
  Filled 2017-08-10: qty 33

## 2017-08-10 MED ORDER — TECHNETIUM TC 99M TETROFOSMIN IV KIT
10.3000 | PACK | Freq: Once | INTRAVENOUS | Status: AC | PRN
  Administered 2017-08-10: 10.3 via INTRAVENOUS
  Filled 2017-08-10: qty 11

## 2017-09-04 ENCOUNTER — Ambulatory Visit (INDEPENDENT_AMBULATORY_CARE_PROVIDER_SITE_OTHER): Payer: BLUE CROSS/BLUE SHIELD | Admitting: Internal Medicine

## 2017-09-04 ENCOUNTER — Other Ambulatory Visit (INDEPENDENT_AMBULATORY_CARE_PROVIDER_SITE_OTHER): Payer: BLUE CROSS/BLUE SHIELD

## 2017-09-04 ENCOUNTER — Encounter: Payer: Self-pay | Admitting: Internal Medicine

## 2017-09-04 ENCOUNTER — Ambulatory Visit (INDEPENDENT_AMBULATORY_CARE_PROVIDER_SITE_OTHER)
Admission: RE | Admit: 2017-09-04 | Discharge: 2017-09-04 | Disposition: A | Discharge status: Home / Self Care | Payer: BLUE CROSS/BLUE SHIELD | Source: Ambulatory Visit | Attending: Internal Medicine | Admitting: Internal Medicine

## 2017-09-04 VITALS — BP 124/70 | HR 51 | Ht 69.0 in | Wt 203.0 lb

## 2017-09-04 DIAGNOSIS — Z0000 Encounter for general adult medical examination without abnormal findings: Secondary | ICD-10-CM

## 2017-09-04 DIAGNOSIS — R0989 Other specified symptoms and signs involving the circulatory and respiratory systems: Secondary | ICD-10-CM | POA: Diagnosis not present

## 2017-09-04 DIAGNOSIS — I10 Essential (primary) hypertension: Secondary | ICD-10-CM | POA: Diagnosis not present

## 2017-09-04 DIAGNOSIS — E7800 Pure hypercholesterolemia, unspecified: Secondary | ICD-10-CM

## 2017-09-04 LAB — PSA: PSA: 0.82 ng/mL (ref 0.10–4.00)

## 2017-09-04 LAB — TSH: TSH: 1.14 u[IU]/mL (ref 0.35–4.50)

## 2017-09-04 NOTE — Progress Notes (Addendum)
Patient ID: [name] [name], male   DOB: [birth year]   MRN: [id number]   Brief patient profile:  29   yobm quit smoking in early 1980's with hypertension hyperlipidemia and atypical chest pain with a left heart catheter showing 60% obstruction of the right-sided branches in the 1999 and a negative stress study in May of 2007> cabg required 10/2010    History of Present Illness  08/12/2010 ov/[name] [name]  Cc new onset cp comes and goes x 1 week so went to ER July 30th with neg w/u and much less cp since er.  Location = Left of sternum only, aching but   No relation to activity, lasts no more than a few seconds, not noticeable supine or sleeping.  Does ok with exercise with no pains. rec Cardiac eval    DATE OF ADMISSION: 10/18/2010  DATE OF DISCHARGE: 10/24/2010  DISCHARGE SUMMARY  PRIMARY ADMITTING DIAGNOSIS: Chest pain.  ADDITIONAL/DISCHARGE DIAGNOSES:  1. Coronary artery disease.  2. New-onset angina.  3. Hypertension.  4. Hyperlipidemia.  5. Remote history of tobacco abuse.  6. Postoperative acute blood loss anemia.  PROCEDURES PERFORMED:  1. Cardiac catheterization.  2. Coronary artery bypass grafting x4 (left internal mammary artery to  the LAD, saphenous vein graft to the second diagonal, saphenous  vein graft to the first obtuse marginal, saphenous vein graft to  the posterior descending).  3. Endoscopic vein harvest, right leg  04/04/2011 f/u ov/[name] [name] cc new L lat localized hip pain worse standing up, 3-4 weeks comes and goes, not tried nsaids, maybe worse toward end of day rec Try advil up to 3 with meals to see if helps hip pain and if not better within a week call for referral to an orthopedic surgeon> referred     09/02/2016 acute extended ov/Hero Mccathern re: neck pain  Chief Complaint  Patient presents with  . Acute Visit    Pt c/o feeling anxious recently. He states he has also noticed some dizziness when he bends over.  He states he has also noticed some soreness in the left side  of his neck for the past.   new onset  10 days positional  L neck shoots into L post shoulder  Tylenol helps/ heat helps / no pain with motion of L shoulder/ no numbness or weakness of arm or hand on L  Overdue for cpx  Not getting any ex at all but with ex denies sob or cp  rec Neck stretching x 6 motions, hold each one x 30 secs and repeat as frequently as you can but at least sev times a day especially at bedtime and on rising in am  Heat/ advil 200 up 3 with meals as needed (9 max per 24h)     10/24/16  Back injury at home leaning over to get something out of car > midline low back pain>  Ortho > shots > no more need for flex/advil     04/05/2017 acute  ov/[name] [name] re: new back pain in pt with hbp Chief Complaint  Patient presents with  . Acute Visit    shrap pain in left side for 1 week now   new left sided back pain acute onset x  two weeks prior to OV  When bending over to reach for luggage  Positional /better on lying down or sitting still  Started advil 3 x day some better No radicular features/ rash/ pain with cough or change with urination /bms Other back pain better p injection of pain  at ortho office  rec Tdap today  flexoril 10 mg up to 4 x daily as needed for muscle spasm Advil 3-4 with meals as needed for pain (= up to 12 per day)  Heating pad may help Call your orthopedic doctor for follow up     08/10/17  Neg Myoview   09/04/2017  f/u ov/[name] [name] re: cpx  Chief Complaint  Patient presents with  . Annual Exam    pt states he is doing well since last visit and denies any complaints.  Dyspnea:  MMRC1 = can walk nl pace, flat grade, can't hurry or go uphills or steps s sob= baseline / no regular aerobic ex    Cough: no Sleeping: baseline SABA use: none  02: none     No obvious day to day or daytime variability or assoc excess/ purulent sputum or mucus plugs or hemoptysis or cp or chest tightness, subjective wheeze or overt sinus or hb symptoms.   Sleeping flat ok   without nocturnal  or early am exacerbation  of respiratory  c/o's or need for noct saba. Also denies any obvious fluctuation of symptoms with weather or environmental changes or other aggravating or alleviating factors except as outlined above   No unusual exposure hx or h/o childhood pna/ asthma or knowledge of premature birth.  Current Allergies, Complete Past Medical History, Past Surgical History, Family History, and Social History were reviewed in [employer] record.  ROS  The following are not active complaints unless bolded Hoarseness, sore throat, dysphagia, dental problems, itching, sneezing,  nasal congestion or discharge of excess mucus or purulent secretions, ear ache,   fever, chills, sweats, unintended wt loss or wt gain, classically pleuritic or exertional cp,  orthopnea pnd or arm/hand swelling  or leg swelling, presyncope, palpitations, abdominal pain, anorexia, nausea, vomiting, diarrhea  or change in bowel habits or change in bladder habits, change in stools or change in urine, dysuria, hematuria,  rash, arthralgias, visual complaints, headache, numbness, weakness or ataxia or problems with walking or coordination,  change in mood or  memory.        Current Meds  Medication Sig  . acetaminophen (TYLENOL) 650 MG CR tablet Take 650 mg by mouth every 8 (eight) hours as needed for pain.  Marland Kitchen aspirin EC 81 MG tablet Take 1 tablet (81 mg total) by mouth daily.  . cyclobenzaprine (FLEXERIL) 10 MG tablet Take 1 tablet (10 mg total) by mouth 4 (four) times daily as needed for muscle spasms.  Marland Kitchen losartan-hydrochlorothiazide (HYZAAR) 100-25 MG tablet TAKE 1 TABLET BY MOUTH DAILY.  . methocarbamol (ROBAXIN) 500 MG tablet Take 1 tablet (500 mg total) by mouth every 6 (six) hours as needed for muscle spasms.  . metoprolol tartrate (LOPRESSOR) 25 MG tablet TAKE 1 TABLET BY MOUTH TWICE A DAY  . nitroGLYCERIN (NITROSTAT) 0.4 MG SL tablet Place 1 tablet (0.4 mg total) under  the tongue every 5 (five) minutes as needed for chest pain.  Marland Kitchen ondansetron (ZOFRAN ODT) 8 MG disintegrating tablet Take 1 tablet (8 mg total) by mouth every 8 (eight) hours as needed for nausea or vomiting.  . simvastatin (ZOCOR) 40 MG tablet TAKE 1 TABLET BY MOUTH EVERYDAY AT BEDTIME  . tiZANidine (ZANAFLEX) 4 MG tablet Take 1 tablet (4 mg total) by mouth every 6 (six) hours as needed for muscle spasms.                   Past Medical History from [facility]:  HYPERTENSION, BENIGN ESSENTIAL, CONTROLLED (ICD-401.1)  OBESITY  - Target wt = 190 for BMI < 30  Colon polyps....................................................................................................[name]  - colonoscopy 4/04, letter sent 05/15/07 reviewed with pt July 17, 2008 and August 03, 2009 > referred back to GI  CHEST PAIN (ICD-786.50)  - s/p cabg 10/19/2010 HYPERLIPIDEMIA  - target LDL less than 70 based on documented ischemic heart disease  HEALTH MAINTENANCE..........................................................................................[name]  - TD 04/05/2017  - CPX   09/04/2017      Family History:  hypertension in mother and father no premature heart disease in any direct relatives nor stroke diabetes or cancer, except that his mother has developed leukemia  1 brother healthy  Neg prostate ca     Social History:  quit smoking about 1983 denies alcohol use. 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 Nl external ear canals without cough reflex   NECK :  without JVD/Nodes/TM/ nl carotid upstrokes bilaterally   LUNGS: no acc muscle use,  Nl contour chest which is clear to A and P bilaterally without cough on insp or exp maneuvers   CV:  RRR  no s3 or murmur or increase in P2, and no edema   ABD:  soft and nontender with nl inspiratory excursion in the supine position. No bruits or organomegaly appreciated, bowel sounds nl  MS:  Nl gait/ ext warm without deformities, calf tenderness, cyanosis or clubbing No obvious joint restrictions   SKIN: warm and dry without lesions    NEURO:  alert, approp, nl sensorium with  no motor or cerebellar deficits apparent.   GU  :  Uncircum, small IH with cough on L , nl testes  Rectal:  Small prostate, no nodules         CXR PA and Lateral:   09/04/2017 :    I personally reviewed images and agree with radiology impression as follows:   There is no active cardiopulmonary disease.  Previous CABG.   Labs ordered/ reviewed:      Chemistry      Component Value Date/Time   NA 140 08/08/2017 0901   K 4.1 08/08/2017 0901   CL 100 08/08/2017 0901   CO2 25 08/08/2017 0901   BUN 21 08/08/2017 0901   CREATININE 1.06 08/08/2017 0901      Component Value Date/Time   CALCIUM 9.3 08/08/2017 0901   ALKPHOS 43 08/08/2017 0901   AST 30 08/08/2017 0901   ALT 37 08/08/2017 0901   BILITOT 0.3 08/08/2017 0901        Lab Results  Component Value Date   WBC 4.9 08/08/2017   HGB 12.5 (L) 08/08/2017   HCT 36.6 (L) 08/08/2017   MCV 90 08/08/2017   PLT 246 08/08/2017        Lab Results  Component Value Date   TSH 1.14 09/04/2017        Lab Results  Component Value Date   PSA 0.82 09/04/2017   PSA 0.50 09/02/2016   PSA 0.48 05/25/2015        Assessment:

## 2017-09-04 NOTE — Patient Instructions (Addendum)
Please remember to go to the lab and x-ray department downstairs in the basement  for your tests - we will call you with the results when they are available.      Please schedule a follow up visit in 6  months but call sooner if needed  

## 2017-09-05 ENCOUNTER — Encounter: Payer: Self-pay | Admitting: Internal Medicine

## 2017-09-05 NOTE — Assessment & Plan Note (Addendum)
Utd/ psa and DRE nl   Strongly advised re more regular sub - max ex min 30 min  3 x weekly now that cards re-eval neg

## 2017-09-05 NOTE — Assessment & Plan Note (Signed)
Lab Results  Component Value Date   CREATININE 1.06 08/08/2017   CREATININE 1.02 09/02/2016   CREATININE 0.90 05/30/2015     Adequate control on present rx, reviewed in detail with pt > no change in rx needed

## 2017-09-05 NOTE — Assessment & Plan Note (Signed)
-   Target LDL < 70 since has known CAD    Lab Results  Component Value Date   CHOL 140 08/08/2017   HDL 36 (L) 08/08/2017   LDLCALC 85 08/08/2017   TRIG 97 08/08/2017   CHOLHDL 3.9 08/08/2017    Adequate control on present rx, reviewed in detail with pt > no change in rx needed  = zocor 40 mg daily

## 2017-11-07 ENCOUNTER — Telehealth: Payer: Self-pay | Admitting: Internal Medicine

## 2017-11-07 MED ORDER — TELMISARTAN-HCTZ 80-25 MG PO TABS: 1.0000 | ORAL_TABLET | Freq: Every day | ORAL | 5 refills | Status: DC

## 2017-11-07 NOTE — Telephone Encounter (Signed)
micardis 80-25 one daily

## 2017-11-07 NOTE — Telephone Encounter (Signed)
Spoke with pt, he states that his Losartan/HCTZ was on back order. He ran out and called Friday morning and he got a call stating they couldn't dispense medication because it is on back order. MW please advise if you want to change his medication. He is totally out.   Patient Instructions by Tanda Rockers, MD at 09/04/2017 8:45 AM  Author: Tanda Rockers, MD Author Type: Physician Filed: 09/04/2017 9:16 AM  Note Status: Addendum Cosign: Cosign Not Required Encounter Date: 09/04/2017  Editor: Tanda Rockers, MD (Physician)  Prior Versions: 1. Tanda Rockers, MD (Physician) at 09/04/2017 9:07 AM - Signed    Please remember to go to the lab and x-ray department downstairs in the basement  for your tests - we will call you with the results when they are available.   Please schedule a follow up visit in 6  months but call sooner if needed        Instructions   Please remember to go to the lab and x-ray department downstairs in the basement  for your tests - we will call you with the results when they are available.   Please schedule a follow up visit in 6  months but call sooner if needed

## 2017-11-07 NOTE — Telephone Encounter (Signed)
Called pt and advised message from the provider. Pt understood and verbalized understanding. Nothing further is needed.   Rx sent to [facility] on [address].

## 2018-01-25 ENCOUNTER — Ambulatory Visit: Payer: BLUE CROSS/BLUE SHIELD

## 2018-03-05 ENCOUNTER — Other Ambulatory Visit: Payer: Self-pay | Admitting: Internal Medicine

## 2018-03-08 ENCOUNTER — Encounter: Payer: Self-pay | Admitting: Internal Medicine

## 2018-03-08 ENCOUNTER — Ambulatory Visit (INDEPENDENT_AMBULATORY_CARE_PROVIDER_SITE_OTHER): Payer: BLUE CROSS/BLUE SHIELD | Admitting: Internal Medicine

## 2018-03-08 VITALS — BP 120/74 | HR 56 | Ht 67.5 in | Wt 204.0 lb

## 2018-03-08 DIAGNOSIS — F419 Anxiety disorder, unspecified: Secondary | ICD-10-CM | POA: Diagnosis not present

## 2018-03-08 DIAGNOSIS — I10 Essential (primary) hypertension: Secondary | ICD-10-CM | POA: Diagnosis not present

## 2018-03-08 DIAGNOSIS — E7800 Pure hypercholesterolemia, unspecified: Secondary | ICD-10-CM

## 2018-03-08 LAB — HEPATIC FUNCTION PANEL
ALT: 30 U/L (ref 0–53)
AST: 25 U/L (ref 0–37)
Albumin: 4.6 g/dL (ref 3.5–5.2)
Alkaline Phosphatase: 44 U/L (ref 39–117)
Bilirubin, Direct: 0.1 mg/dL (ref 0.0–0.3)
Total Bilirubin: 0.5 mg/dL (ref 0.2–1.2)
Total Protein: 7.3 g/dL (ref 6.0–8.3)

## 2018-03-08 LAB — BASIC METABOLIC PANEL
BUN: 22 mg/dL (ref 6–23)
CO2: 31 mEq/L (ref 19–32)
Calcium: 9.8 mg/dL (ref 8.4–10.5)
Chloride: 102 mEq/L (ref 96–112)
Creatinine, Ser: 1.07 mg/dL (ref 0.40–1.50)
GFR: 84.31 mL/min (ref >60.00)
Glucose, Bld: 106 mg/dL — ABNORMAL HIGH (ref 70–99)
Potassium: 4.2 mEq/L (ref 3.5–5.1)
Sodium: 139 mEq/L (ref 135–145)

## 2018-03-08 LAB — LIPID PANEL
Cholesterol: 148 mg/dL (ref 0–200)
HDL: 35.9 mg/dL — ABNORMAL LOW (ref >39.00)
LDL Cholesterol: 91 mg/dL (ref 0–99)
NonHDL: 111.7
Total CHOL/HDL Ratio: 4
Triglycerides: 104 mg/dL (ref 0.0–149.0)
VLDL: 20.8 mg/dL (ref 0.0–40.0)

## 2018-03-08 NOTE — Progress Notes (Signed)
Patient ID: [name] [name], male   DOB: [birth year]   MRN: [id number]   Brief patient profile:  29   yobm quit smoking in early 1980's with hypertension hyperlipidemia and atypical chest pain with a left heart catheter showing 60% obstruction of the right-sided branches in the 1999 and a negative stress study in May of 2007> cabg required 10/2010    History of Present Illness  08/12/2010 ov/[name] [name]  Cc new onset cp comes and goes x 1 week so went to ER July 30th with neg w/u and much less cp since er.  Location = Left of sternum only, aching but   No relation to activity, lasts no more than a few seconds, not noticeable supine or sleeping.  Does ok with exercise with no pains. rec Cardiac eval    DATE OF ADMISSION: 10/18/2010  DATE OF DISCHARGE: 10/24/2010  DISCHARGE SUMMARY  PRIMARY ADMITTING DIAGNOSIS: Chest pain.  ADDITIONAL/DISCHARGE DIAGNOSES:  1. Coronary artery disease.  2. New-onset angina.  3. Hypertension.  4. Hyperlipidemia.  5. Remote history of tobacco abuse.  6. Postoperative acute blood loss anemia.  PROCEDURES PERFORMED:  1. Cardiac catheterization.  2. Coronary artery bypass grafting x4 (left internal mammary artery to  the LAD, saphenous vein graft to the second diagonal, saphenous  vein graft to the first obtuse marginal, saphenous vein graft to  the posterior descending).  3. Endoscopic vein harvest, right leg  04/04/2011 f/u ov/[name] [name] cc new L lat localized hip pain worse standing up, 3-4 weeks comes and goes, not tried nsaids, maybe worse toward end of day rec Try advil up to 3 with meals to see if helps hip pain and if not better within a week call for referral to an orthopedic surgeon> referred     09/02/2016 acute extended ov/[name] [name] re: neck pain  Chief Complaint  Patient presents with  . Acute Visit    Pt c/o feeling anxious recently. He states he has also noticed some dizziness when he bends over.  He states he has also noticed some soreness in the left side  of his neck for the past.   new onset  10 days positional  L neck shoots into L post shoulder  Tylenol helps/ heat helps / no pain with motion of L shoulder/ no numbness or weakness of arm or hand on L  Overdue for cpx  Not getting any ex at all but with ex denies sob or cp  rec Neck stretching x 6 motions, hold each one x 30 secs and repeat as frequently as you can but at least sev times a day especially at bedtime and on rising in am  Heat/ advil 200 up 3 with meals as needed (9 max per 24h)     10/24/16  Back injury at home leaning over to get something out of car > midline low back pain>  Ortho > shots > no more need for flex/advil     04/05/2017 acute  ov/[name] [name] re: new back pain in pt with hbp Chief Complaint  Patient presents with  . Acute Visit    shrap pain in left side for 1 week now   new left sided back pain acute onset x  two weeks prior to OV  When bending over to reach for luggage  Positional /better on lying down or sitting still  Started advil 3 x day some better No radicular features/ rash/ pain with cough or change with urination /bms Other back pain better p injection of pain  at ortho office  rec Tdap today  flexoril 10 mg up to 4 x daily as needed for muscle spasm Advil 3-4 with meals as needed for pain (= up to 12 per day)  Heating pad may help Call your orthopedic doctor for follow up     08/10/17  Neg Myoview   09/04/2017  f/u ov/[name] [name] re: cpx  Chief Complaint  Patient presents with  . Annual Exam    pt states he is doing well since last visit and denies any complaints.  Dyspnea:  MMRC1 = can walk nl pace, flat grade, can't hurry or go uphills or steps s sob= baseline / no regular aerobic ex    Cough: no Sleeping: baseline  rec No change rx   03/08/2018  f/u ov/[name] [name] re:  cpx  Chief Complaint  Patient presents with  . Follow-up    He states had some anxiety and SOB a few days ago- lasted for a short period of time. He has also noticed some swelling  under his arms recently.   Dyspnea:  MMRC1 = can walk nl pace, flat grade, can't hurry or go uphills or steps s sob  - no change in ex tol since spell that occurred at rest and resolved on it's own p 30 min   Cough: no  Sleeping: flat bed, one pillow SABA use: none 02: none    No obvious day to day or daytime variability or assoc excess/ purulent sputum or mucus plugs or hemoptysis or cp or chest tightness, subjective wheeze or overt sinus or hb symptoms.   sleeping without nocturnal  or early am exacerbation  of respiratory  c/o's or need for noct saba. Also denies any obvious fluctuation of symptoms with weather or environmental changes or other aggravating or alleviating factors except as outlined above   No unusual exposure hx or h/o childhood pna/ asthma or knowledge of premature birth.  Current Allergies, Complete Past Medical History, Past Surgical History, Family History, and Social History were reviewed in [employer] record.  ROS  The following are not active complaints unless bolded Hoarseness, sore throat, dysphagia, dental problems, itching, sneezing,  nasal congestion or discharge of excess mucus or purulent secretions, ear ache,   fever, chills, sweats, unintended wt loss or wt gain, classically pleuritic or exertional cp,  orthopnea pnd or arm/hand swelling  or leg swelling, presyncope, palpitations, abdominal pain, anorexia, nausea, vomiting, diarrhea  or change in bowel habits or change in bladder habits, change in stools or change in urine, dysuria, hematuria,  rash, arthralgias, visual complaints, headache, numbness, weakness or ataxia or problems with walking or coordination,  change in mood or  memory.        Current Meds  Medication Sig  . acetaminophen (TYLENOL) 650 MG CR tablet Take 650 mg by mouth every 8 (eight) hours as needed for pain.  Marland Kitchen aspirin EC 81 MG tablet Take 1 tablet (81 mg total) by mouth daily.  . cyclobenzaprine (FLEXERIL) 10 MG  tablet Take 1 tablet (10 mg total) by mouth 4 (four) times daily as needed for muscle spasms.  Marland Kitchen losartan-hydrochlorothiazide (HYZAAR) 100-25 MG tablet TAKE 1 TABLET BY MOUTH DAILY.  . methocarbamol (ROBAXIN) 500 MG tablet Take 1 tablet (500 mg total) by mouth every 6 (six) hours as needed for muscle spasms.  . metoprolol tartrate (LOPRESSOR) 25 MG tablet TAKE 1 TABLET BY MOUTH TWICE A DAY  . nitroGLYCERIN (NITROSTAT) 0.4 MG SL tablet Place 1 tablet (0.4 mg  total) under the tongue every 5 (five) minutes as needed for chest pain.  Marland Kitchen ondansetron (ZOFRAN ODT) 8 MG disintegrating tablet Take 1 tablet (8 mg total) by mouth every 8 (eight) hours as needed for nausea or vomiting.  . simvastatin (ZOCOR) 40 MG tablet TAKE 1 TABLET BY MOUTH EVERYDAY AT BEDTIME  . telmisartan-hydrochlorothiazide (MICARDIS HCT) 80-25 MG tablet TAKE 1 TABLET BY MOUTH EVERY DAY  . tiZANidine (ZANAFLEX) 4 MG tablet Take 1 tablet (4 mg total) by mouth every 6 (six) hours as needed for muscle spasms.                       Past Medical History from [facility]: S/p THR on L Blackman and Erskine Emery PA  HYPERTENSION, BENIGN ESSENTIAL, CONTROLLED (ICD-401.1)  OBESITY  - Target wt = 190 for BMI < 30  Colon polyps....................................................................................................[name]  - colonoscopy 4/04, letter sent 05/15/07 reviewed with pt July 17, 2008 and August 03, 2009 > referred back to GI  CHEST PAIN (ICD-786.50)  - s/p cabg 10/19/2010 HYPERLIPIDEMIA  - target LDL less than 70 based on documented ischemic heart disease  HEALTH MAINTENANCE..........................................................................................[name]  - TD 04/05/2017  - CPX   09/04/2017      Family History:  hypertension in mother and father no premature heart disease in any direct relatives nor stroke diabetes or cancer, except that his mother has developed leukemia  1 brother healthy  Neg  prostate ca     Social History:  quit smoking about 1983 denies alcohol use.  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 Nl external ear canals without cough reflex   NECK :  without JVD/Nodes/TM/ nl carotid upstrokes bilaterally   LUNGS: no acc muscle use,  Nl contour chest with no ax nodes or tenderness/   clear to A and P bilaterally without cough on insp or exp maneuvers   CV:  RRR  no s3 or murmur or increase in P2, and no edema   ABD:  soft and nontender with nl inspiratory excursion in the supine position. No bruits or organomegaly appreciated, bowel sounds nl  MS:  Nl gait/ ext warm without deformities, calf tenderness, cyanosis or clubbing No obvious joint restrictions   SKIN: warm and dry without lesions    NEURO:  alert, approp, nl sensorium with  no motor or cerebellar deficits apparent.   Labs ordered 03/08/2018  Lipid profile    Chemistry      Component Value Date/Time   NA 139 03/08/2018 0917   NA 140 08/08/2017 0901   K 4.2 03/08/2018 0917   CL 102 03/08/2018 0917   CO2 31 03/08/2018 0917   BUN 22 03/08/2018 0917   BUN 21 08/08/2017 0901  CREATININE 1.07 03/08/2018 0917      Component Value Date/Time   CALCIUM 9.8 03/08/2018 0917   ALKPHOS 44 03/08/2018 0917   AST 25 03/08/2018 0917   ALT 30 03/08/2018 0917   BILITOT 0.5 03/08/2018 0917   BILITOT 0.3 08/08/2017 0901          Assessment:

## 2018-03-08 NOTE — Patient Instructions (Addendum)
To get the most out of exercise, you need to be continuously aware that you are short of breath, but never out of breath, for 30 minutes daily. As you improve, it will actually be easier for you to do the same amount of exercise  in  30 minutes so always push to the level where you are short of breath.      Please remember to go to the lab department   for your tests - we will call you with the results when they are available.      cpx due 6 months, call sooner if needed

## 2018-03-10 ENCOUNTER — Encounter: Payer: Self-pay | Admitting: Internal Medicine

## 2018-03-10 DIAGNOSIS — F419 Anxiety disorder, unspecified: Secondary | ICD-10-CM | POA: Insufficient documentation

## 2018-03-10 NOTE — Assessment & Plan Note (Signed)
Target LDL < 70 since has known CAD   Lab Results  Component Value Date   CHOL 148 03/08/2018   HDL 35.90 (L) 03/08/2018   LDLCALC 91 03/08/2018   TRIG 104.0 03/08/2018   CHOLHDL 4 03/08/2018     LDL drifting up a bit but already on zocor 40 mg so rec diet/ ex rather than increase statin

## 2018-03-10 NOTE — Assessment & Plan Note (Signed)
Lab Results  Component Value Date   CREATININE 1.07 03/08/2018   CREATININE 1.06 08/08/2017   CREATININE 1.02 09/02/2016     Adequate control on present rx, reviewed in detail with pt > no change in rx needed

## 2018-03-10 NOTE — Assessment & Plan Note (Addendum)
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

## 2018-03-12 NOTE — Progress Notes (Signed)
Spoke with pt and notified of results per Dr. [name]. Pt verbalized understanding and denied any questions. 

## 2018-03-14 ENCOUNTER — Other Ambulatory Visit: Payer: Self-pay | Admitting: Internal Medicine

## 2018-07-25 ENCOUNTER — Telehealth: Payer: Self-pay | Admitting: Internal Medicine

## 2018-07-25 MED ORDER — SIMVASTATIN 40 MG PO TABS: ORAL_TABLET | ORAL | 0 refills | Status: DC

## 2018-07-25 NOTE — Telephone Encounter (Signed)
Returned call to patient Pt has appt upcoming end of August for chol check. Asked if he wanted to continue with 90 supply on refill. Pt would like 30 day supply until after visit. rx sent for 30 days supply Nothing further needed.

## 2018-08-16 ENCOUNTER — Other Ambulatory Visit: Payer: Self-pay | Admitting: Internal Medicine

## 2018-08-31 ENCOUNTER — Other Ambulatory Visit: Payer: Self-pay | Admitting: Internal Medicine

## 2018-09-05 ENCOUNTER — Other Ambulatory Visit: Payer: Self-pay

## 2018-09-05 ENCOUNTER — Ambulatory Visit (INDEPENDENT_AMBULATORY_CARE_PROVIDER_SITE_OTHER): Payer: BC Managed Care – PPO | Admitting: Internal Medicine

## 2018-09-05 ENCOUNTER — Ambulatory Visit (INDEPENDENT_AMBULATORY_CARE_PROVIDER_SITE_OTHER): Payer: BC Managed Care – PPO

## 2018-09-05 ENCOUNTER — Encounter: Payer: Self-pay | Admitting: Internal Medicine

## 2018-09-05 ENCOUNTER — Other Ambulatory Visit: Payer: Self-pay | Admitting: Internal Medicine

## 2018-09-05 VITALS — BP 106/60 | HR 54 | Temp 97.4°F | Ht 67.5 in | Wt 197.0 lb

## 2018-09-05 DIAGNOSIS — E7800 Pure hypercholesterolemia, unspecified: Secondary | ICD-10-CM | POA: Diagnosis not present

## 2018-09-05 DIAGNOSIS — I10 Essential (primary) hypertension: Secondary | ICD-10-CM

## 2018-09-05 DIAGNOSIS — Z0000 Encounter for general adult medical examination without abnormal findings: Secondary | ICD-10-CM | POA: Diagnosis not present

## 2018-09-05 DIAGNOSIS — R0602 Shortness of breath: Secondary | ICD-10-CM | POA: Diagnosis not present

## 2018-09-05 DIAGNOSIS — Z23 Encounter for immunization: Secondary | ICD-10-CM | POA: Diagnosis not present

## 2018-09-05 LAB — BASIC METABOLIC PANEL
BUN: 24 mg/dL — ABNORMAL HIGH (ref 6–23)
CO2: 31 mEq/L (ref 19–32)
Calcium: 9.8 mg/dL (ref 8.4–10.5)
Chloride: 100 mEq/L (ref 96–112)
Creatinine, Ser: 1.19 mg/dL (ref 0.40–1.50)
GFR: 74.46 mL/min (ref >60.00)
Glucose, Bld: 102 mg/dL — ABNORMAL HIGH (ref 70–99)
Potassium: 4.3 mEq/L (ref 3.5–5.1)
Sodium: 136 mEq/L (ref 135–145)

## 2018-09-05 LAB — TSH: TSH: 0.81 u[IU]/mL (ref 0.35–4.50)

## 2018-09-05 LAB — URINALYSIS
Bilirubin Urine: NEGATIVE
Hgb urine dipstick: NEGATIVE
Ketones, ur: NEGATIVE
Leukocytes,Ua: NEGATIVE
Nitrite: NEGATIVE
Specific Gravity, Urine: 1.02 (ref 1.000–1.030)
Total Protein, Urine: NEGATIVE
Urine Glucose: NEGATIVE
Urobilinogen, UA: 0.2 (ref 0.0–1.0)
pH: 6 (ref 5.0–8.0)

## 2018-09-05 LAB — CBC WITH DIFFERENTIAL/PLATELET
Basophils Absolute: 0 10*3/uL (ref 0.0–0.1)
Basophils Relative: 0.8 % (ref 0.0–3.0)
Eosinophils Absolute: 0.1 10*3/uL (ref 0.0–0.7)
Eosinophils Relative: 1.6 % (ref 0.0–5.0)
HCT: 36.3 % — ABNORMAL LOW (ref 39.0–52.0)
Hemoglobin: 12.1 g/dL — ABNORMAL LOW (ref 13.0–17.0)
Lymphocytes Relative: 27.1 % (ref 12.0–46.0)
Lymphs Abs: 1.5 10*3/uL (ref 0.7–4.0)
MCHC: 33.4 g/dL (ref 30.0–36.0)
MCV: 94.1 fl (ref 78.0–100.0)
Monocytes Absolute: 0.5 10*3/uL (ref 0.1–1.0)
Monocytes Relative: 8.3 % (ref 3.0–12.0)
Neutro Abs: 3.5 10*3/uL (ref 1.4–7.7)
Neutrophils Relative %: 62.2 % (ref 43.0–77.0)
Platelets: 245 10*3/uL (ref 150.0–400.0)
RBC: 3.85 Mil/uL — ABNORMAL LOW (ref 4.22–5.81)
RDW: 13 % (ref 11.5–15.5)
WBC: 5.6 10*3/uL (ref 4.0–10.5)

## 2018-09-05 LAB — HEPATIC FUNCTION PANEL
ALT: 31 U/L (ref 0–53)
AST: 23 U/L (ref 0–37)
Albumin: 4.8 g/dL (ref 3.5–5.2)
Alkaline Phosphatase: 49 U/L (ref 39–117)
Bilirubin, Direct: 0.1 mg/dL (ref 0.0–0.3)
Total Bilirubin: 0.5 mg/dL (ref 0.2–1.2)
Total Protein: 7.4 g/dL (ref 6.0–8.3)

## 2018-09-05 LAB — LIPID PANEL
Cholesterol: 144 mg/dL (ref 0–200)
HDL: 32.2 mg/dL — ABNORMAL LOW (ref >39.00)
LDL Cholesterol: 90 mg/dL (ref 0–99)
NonHDL: 111.37
Total CHOL/HDL Ratio: 4
Triglycerides: 109 mg/dL (ref 0.0–149.0)
VLDL: 21.8 mg/dL (ref 0.0–40.0)

## 2018-09-05 LAB — PSA: PSA: 0.7 ng/mL (ref 0.10–4.00)

## 2018-09-05 NOTE — Progress Notes (Signed)
Spoke with pt and notified of results per Dr. [name]. Pt verbalized understanding and denied any questions. 

## 2018-09-05 NOTE — Progress Notes (Signed)
Patient ID: [name] [name], male   DOB: 26-Dec-1955   MRN: [id number]   Brief patient profile:  83   yobm quit smoking in early 1980's with hypertension hyperlipidemia and atypical chest pain with a left heart catheter showing 60% obstruction of the right-sided branches in the 1999 and a negative stress study in May of 2007> cabg required 10/2010    History of Present Illness  08/12/2010 ov/[name] [name]  Cc new onset cp comes and goes x 1 week so went to ER July 30th with neg w/u and much less cp since er.  Location = Left of sternum only, aching but   No relation to activity, lasts no more than a few seconds, not noticeable supine or sleeping.  Does ok with exercise with no pains. rec Cardiac eval    DATE OF ADMISSION: 10/18/2010  DATE OF DISCHARGE: 10/24/2010  DISCHARGE SUMMARY  PRIMARY ADMITTING DIAGNOSIS: Chest pain.  ADDITIONAL/DISCHARGE DIAGNOSES:  1. Coronary artery disease.  2. New-onset angina.  3. Hypertension.  4. Hyperlipidemia.  5. Remote history of tobacco abuse.  6. Postoperative acute blood loss anemia.  PROCEDURES PERFORMED:  1. Cardiac catheterization.  2. Coronary artery bypass grafting x4 (left internal mammary artery to  the LAD, saphenous vein graft to the second diagonal, saphenous  vein graft to the first obtuse marginal, saphenous vein graft to  the posterior descending).  3. Endoscopic vein harvest, right leg  04/04/2011 f/u ov/[name] [name] cc new L lat localized hip pain worse standing up, 3-4 weeks comes and goes, not tried nsaids, maybe worse toward end of day rec Try advil up to 3 with meals to see if helps hip pain and if not better within a week call for referral to an orthopedic surgeon> referred     09/02/2016 acute extended ov/[name] [name] re: neck pain  Chief Complaint  Patient presents with  . Acute Visit    Pt c/o feeling anxious recently. He states he has also noticed some dizziness when he bends over.  He states he has also noticed some soreness in the left side  of his neck for the past.   new onset  10 days positional  L neck shoots into L post shoulder  Tylenol helps/ heat helps / no pain with motion of L shoulder/ no numbness or weakness of arm or hand on L  Overdue for cpx  Not getting any ex at all but with ex denies sob or cp  rec Neck stretching x 6 motions, hold each one x 30 secs and repeat as frequently as you can but at least sev times a day especially at bedtime and on rising in am  Heat/ advil 200 up 3 with meals as needed (9 max per 24h)     10/24/16  Back injury at home leaning over to get something out of car > midline low back pain>  Ortho > shots > no more need for flex/advil     04/05/2017 acute  ov/[name] [name] re: new back pain in pt with hbp Chief Complaint  Patient presents with  . Acute Visit    shrap pain in left side for 1 week now   new left sided back pain acute onset x  two weeks prior to OV  When bending over to reach for luggage  Positional /better on lying down or sitting still  Started advil 3 x day some better No radicular features/ rash/ pain with cough or change with urination /bms Other back pain better p injection of pain  at ortho office  rec Tdap today  flexoril 10 mg up to 4 x daily as needed for muscle spasm Advil 3-4 with meals as needed for pain (= up to 12 per day)  Heating pad may help Call your orthopedic doctor for follow up     08/10/17  Neg Myoview   03/08/2018  f/u ov/[name] [name] re:  Hbp/sob  Chief Complaint  Patient presents with  . Follow-up    He states had some anxiety and SOB a few days ago- lasted for a short period of time. He has also noticed some swelling under his arms recently.   Dyspnea:  MMRC1 = can walk nl pace, flat grade, can't hurry or go uphills or steps s sob  - no change in ex tol since spell that occurred at rest and resolved on it's own p 30 min   Cough: no  Sleeping: flat bed, one pillow rec Work on wt      09/05/2018  f/u ov/[name] [name] re:  cpx Chief Complaint  Patient  presents with  . Annual Exam    not fasting. Doing well and denies any co's.   Dyspnea:  Better with exertion , tol steps better, losing wt intentionally Cough: none  Sleeping: fine flat in bed  SABA use: non 02: none Back pain s radicular features, worse at bedtime    No obvious day to day or daytime variability or assoc excess/ purulent sputum or mucus plugs or hemoptysis or cp or chest tightness, subjective wheeze or overt sinus or hb symptoms.   Sleeping as above without nocturnal  or early am exacerbation  of respiratory  c/o's or need for noct saba. Also denies any obvious fluctuation of symptoms with weather or environmental changes or other aggravating or alleviating factors except as outlined above   No unusual exposure hx or h/o childhood pna/ asthma or knowledge of premature birth.  Current Allergies, Complete Past Medical History, Past Surgical History, Family History, and Social History were reviewed in [employer] record.  ROS  The following are not active complaints unless bolded Hoarseness, sore throat, dysphagia, dental problems, itching, sneezing,  nasal congestion or discharge of excess mucus or purulent secretions, ear ache,   fever, chills, sweats, unintended wt loss or wt gain, classically pleuritic or exertional cp,  orthopnea pnd or arm/hand swelling  or leg swelling, presyncope, palpitations, abdominal pain, anorexia, nausea, vomiting, diarrhea  or change in bowel habits or change in bladder habits, change in stools or change in urine, dysuria, hematuria,  rash, arthralgias/chronic back pain s radicular features , visual complaints, headache, numbness, weakness or ataxia or problems with walking or coordination,  change in mood or  memory.        Current Meds  Medication Sig  . acetaminophen (TYLENOL) 650 MG CR tablet Take 650 mg by mouth every 8 (eight) hours as needed for pain.  Marland Kitchen aspirin EC 81 MG tablet Take 1 tablet (81 mg total) by mouth  daily.  . cyclobenzaprine (FLEXERIL) 10 MG tablet Take 1 tablet (10 mg total) by mouth 4 (four) times daily as needed for muscle spasms.  Marland Kitchen losartan-hydrochlorothiazide (HYZAAR) 100-25 MG tablet TAKE 1 TABLET BY MOUTH DAILY.  . methocarbamol (ROBAXIN) 500 MG tablet Take 1 tablet (500 mg total) by mouth every 6 (six) hours as needed for muscle spasms.  . metoprolol tartrate (LOPRESSOR) 25 MG tablet TAKE 1 TABLET BY MOUTH TWICE A DAY  . nitroGLYCERIN (NITROSTAT) 0.4 MG SL tablet Place 1  tablet (0.4 mg total) under the tongue every 5 (five) minutes as needed for chest pain.  Marland Kitchen ondansetron (ZOFRAN ODT) 8 MG disintegrating tablet Take 1 tablet (8 mg total) by mouth every 8 (eight) hours as needed for nausea or vomiting.  . simvastatin (ZOCOR) 40 MG tablet TAKE 1 TABLET BY MOUTH EVERYDAY AT BEDTIME  . telmisartan-hydrochlorothiazide (MICARDIS HCT) 80-25 MG tablet TAKE 1 TABLET BY MOUTH EVERY DAY  . tiZANidine (ZANAFLEX) 4 MG tablet Take 1 tablet (4 mg total) by mouth every 6 (six) hours as needed for muscle spasms.                                   Past Medical History from [facility]: S/p THR on L Blackman and Erskine Emery PA  HYPERTENSION, BENIGN ESSENTIAL, CONTROLLED (ICD-401.1)  OBESITY  - Target wt = 190 for BMI < 30  Colon polyps..............................................................................................  - colonoscopy 4/04, letter sent 05/15/07 reviewed with pt July 17, 2008 and August 03, 2009 > referred back to GI  CHEST PAIN (ICD-786.50) ....................................................................................  Nishan - s/p cabg 10/19/2010 HYPERLIPIDEMIA  - target LDL less than 70 based on documented ischemic heart disease  HEALTH MAINTENANCE..........................................................................................[name]  - TD 04/05/2017  - Prevnar 13 09/05/2018  - CPX   09/05/2018      Family History:  hypertension in mother and  father no premature heart disease in any direct relatives nor stroke diabetes or cancer, except that his mother has developed leukemia  1 brother healthy  Neg prostate ca     Social History:  quit smoking about 1983 denies alcohol use.  Works at a [employer]              Objective:   Physical Exam  amb bm nad  Vital signs reviewed - Note on arrival 02 sats  99% on RA     09/05/2018  197  wt 201 November 20, 2007 > 205 April 18, 2008 > 184 01/03/2011 > 182 04/04/2011 > 07/04/2011  185  >189 09/19/2011 > 01/02/2012 194 >204 02/09/2012 >196 04/04/2012 > 04/25/2012 194 > 11/02/2012  193 > 01/19/2013  190 >  05/23/2014  193 > 05/25/2015 196 > 09/02/2016   200   > 04/05/2017   207 > 05/18/2017  99% RA> 09/04/2017 203>  03/08/2018   204   amb pleasant bm nad  HEENT: nl dentition, turbinates bilaterally, and oropharynx. Nl external ear canals without cough reflex   NECK :  without JVD/Nodes/TM/ nl carotid upstrokes bilaterally   LUNGS: no acc muscle use,  Nl contour chest which is clear to A and P bilaterally without cough on insp or exp maneuvers   CV:  RRR  no s3 or murmur or increase in P2, and no edema  Pulses sym decreased both DP's  ABD:  soft and nontender with nl inspiratory excursion in the supine position. No bruits or organomegaly appreciated, bowel sounds nl  MS:  Nl gait/ ext warm without deformities, calf tenderness, cyanosis or clubbing No obvious joint restrictions   SKIN: warm and dry with surgical scar L hip  NEURO:  alert, approp, nl sensorium with  no motor or cerebellar deficits apparent.     GU :  Uncircum Testes down bilaterally, no nodules or IH  Rectal mild bph, no nodules, stool G neg   Labs ordered/ reviewed:      Chemistry      Component Value  Date/Time   NA 136 09/05/2018 0958   NA 140 08/08/2017 0901   K 4.3 09/05/2018 0958   CL 100 09/05/2018 0958   CO2 31 09/05/2018 0958   BUN 24 (H) 09/05/2018 0958   BUN 21 08/08/2017 0901   CREATININE  1.19 09/05/2018 0958      Component Value Date/Time   CALCIUM 9.8 09/05/2018 0958   ALKPHOS 49 09/05/2018 0958   AST 23 09/05/2018 0958   ALT 31 09/05/2018 0958   BILITOT 0.5 09/05/2018 0958   BILITOT 0.3 08/08/2017 0901        Lab Results  Component Value Date   WBC 5.6 09/05/2018   HGB 12.1 (L) 09/05/2018   HCT 36.3 (L) 09/05/2018   MCV 94.1 09/05/2018   PLT 245.0 09/05/2018        Lab Results  Component Value Date   TSH 0.81 09/05/2018             Lab Results  Component Value Date   PSA 0.70 09/05/2018   PSA 0.82 09/04/2017   PSA 0.50 09/02/2016    CXR PA and Lateral:   09/05/2018 :    I personally reviewed images and agree with radiology impression as follows:   No acute cardiopulmonary process.  ekg 09/05/2018  wnl   Assessment:

## 2018-09-05 NOTE — Patient Instructions (Addendum)
If blood pressure running on low side then break the telmisartan-hctz in half   Please remember to go to the lab and x-ray department   for your tests - we will call you with the results when they are available.     Please schedule a follow up visit in  6 months but call sooner if needed  Add:  Clarify which ARB he's taking - should only be one

## 2018-09-06 ENCOUNTER — Ambulatory Visit: Payer: BLUE CROSS/BLUE SHIELD | Admitting: Internal Medicine

## 2018-09-07 ENCOUNTER — Encounter: Payer: Self-pay | Admitting: Internal Medicine

## 2018-09-07 NOTE — Assessment & Plan Note (Signed)
Lab Results  Component Value Date   CREATININE 1.19 09/05/2018   CREATININE 1.07 03/08/2018   CREATININE 1.06 08/08/2017     Adequate control on present rx, reviewed in detail with pt > no change in rx needed

## 2018-09-07 NOTE — Assessment & Plan Note (Signed)
Target LDL < 70 since has known CAD  On zocor 40 mg daily with nl lfts  Lab Results  Component Value Date   CHOL 144 09/05/2018   HDL 32.20 (L) 09/05/2018   LDLCALC 90 09/05/2018   TRIG 109.0 09/05/2018   CHOLHDL 4 09/05/2018    Adequate control on present rx, reviewed in detail with pt > no change in rx needed

## 2018-09-07 NOTE — Assessment & Plan Note (Signed)
>>>   prevnar now and pneumovax at age 63  O/w utd

## 2018-10-19 ENCOUNTER — Telehealth: Payer: Self-pay | Admitting: Internal Medicine

## 2018-10-19 NOTE — Telephone Encounter (Signed)
Ov with drug formulary w/in next 4 weeks  He is actually on micardis, not losartan bu the price is up so just taking one half with good results

## 2018-10-19 NOTE — Telephone Encounter (Signed)
Losartan is not even on his med list  ATC the pt- line rang and then turns busy, [facility]

## 2018-10-19 NOTE — Telephone Encounter (Signed)
Called and spoke w/ pt. Pt states he has been taking losartan 1/2 tablet daily as recommended by MW. He states this manages his BP well - reports taking his BP 3-4 times a week and finds it running [id number] systolic pressure / mid-low [id number] diastolic. He states he has some leftover losartan from only taking 1/2 tablet daily; however, noticed that his copay has increased to $200 for a 90-day supply. He states he has received 90-day supplies of this medication in the past with no increase in copay. He states a 30-day supply is $180.   Furthermore, pt is inquiring if he should still continue to take the losartan 1/2 tab daily or if there is an alternative ARB MW recommends that would be less expensive. I let him know MW is not currently in the office; however, we could get this message routed to the provider of the day for follow-up. I also let him know he may have to call his insurance company to find out alternative ARBs that may be more affordable. Pt expressed understanding.   Since MW is not currently in the office, I am routing this message to our provider of the day. TP, please advise with your recommendations for this pt. Thank you.

## 2018-10-19 NOTE — Telephone Encounter (Signed)
It is fine for 3 month supply, would recommend getting insurance formulary or discussing with pharmacy prior to [place] sending to check on cost. And can send to Dr. Melvyn Novas  For review

## 2018-10-22 NOTE — Telephone Encounter (Signed)
Spoke with the pt and scheduled appt and advised to bring drug formaulary

## 2018-11-12 ENCOUNTER — Other Ambulatory Visit: Payer: Self-pay

## 2018-11-12 ENCOUNTER — Ambulatory Visit (INDEPENDENT_AMBULATORY_CARE_PROVIDER_SITE_OTHER): Payer: BC Managed Care – PPO | Admitting: Internal Medicine

## 2018-11-12 ENCOUNTER — Encounter: Payer: Self-pay | Admitting: Internal Medicine

## 2018-11-12 DIAGNOSIS — I10 Essential (primary) hypertension: Secondary | ICD-10-CM | POA: Diagnosis not present

## 2018-11-12 DIAGNOSIS — J309 Allergic rhinitis, unspecified: Secondary | ICD-10-CM | POA: Diagnosis not present

## 2018-11-12 LAB — CBC WITH DIFFERENTIAL/PLATELET
Basophils Absolute: 0 10*3/uL (ref 0.0–0.1)
Basophils Relative: 0.8 % (ref 0.0–3.0)
Eosinophils Absolute: 0.1 10*3/uL (ref 0.0–0.7)
Eosinophils Relative: 2.1 % (ref 0.0–5.0)
HCT: 38.2 % — ABNORMAL LOW (ref 39.0–52.0)
Hemoglobin: 12.8 g/dL — ABNORMAL LOW (ref 13.0–17.0)
Lymphocytes Relative: 33.5 % (ref 12.0–46.0)
Lymphs Abs: 1.7 10*3/uL (ref 0.7–4.0)
MCHC: 33.4 g/dL (ref 30.0–36.0)
MCV: 93.6 fl (ref 78.0–100.0)
Monocytes Absolute: 0.4 10*3/uL (ref 0.1–1.0)
Monocytes Relative: 8.4 % (ref 3.0–12.0)
Neutro Abs: 2.9 10*3/uL (ref 1.4–7.7)
Neutrophils Relative %: 55.2 % (ref 43.0–77.0)
Platelets: 250 10*3/uL (ref 150.0–400.0)
RBC: 4.08 Mil/uL — ABNORMAL LOW (ref 4.22–5.81)
RDW: 13.2 % (ref 11.5–15.5)
WBC: 5.2 10*3/uL (ref 4.0–10.5)

## 2018-11-12 LAB — BASIC METABOLIC PANEL
BUN: 19 mg/dL (ref 6–23)
CO2: 30 mEq/L (ref 19–32)
Calcium: 9.9 mg/dL (ref 8.4–10.5)
Chloride: 99 mEq/L (ref 96–112)
Creatinine, Ser: 1.04 mg/dL (ref 0.40–1.50)
GFR: 86.93 mL/min (ref >60.00)
Glucose, Bld: 112 mg/dL — ABNORMAL HIGH (ref 70–99)
Potassium: 3.5 mEq/L (ref 3.5–5.1)
Sodium: 137 mEq/L (ref 135–145)

## 2018-11-12 MED ORDER — LOSARTAN POTASSIUM-HCTZ 100-25 MG PO TABS: 1.0000 | ORAL_TABLET | Freq: Every day | ORAL | 3 refills | Status: DC

## 2018-11-12 NOTE — Progress Notes (Signed)
Patient ID: [name] [name], male   DOB: 26-Dec-1955   MRN: [id number]   Brief patient profile:  83   yobm quit smoking in early 1980's with hypertension hyperlipidemia and atypical chest pain with a left heart catheter showing 60% obstruction of the right-sided branches in the 1999 and a negative stress study in May of 2007> cabg required 10/2010    History of Present Illness  08/12/2010 ov/[name] [name]  Cc new onset cp comes and goes x 1 week so went to ER July 30th with neg w/u and much less cp since er.  Location = Left of sternum only, aching but   No relation to activity, lasts no more than a few seconds, not noticeable supine or sleeping.  Does ok with exercise with no pains. rec Cardiac eval    DATE OF ADMISSION: 10/18/2010  DATE OF DISCHARGE: 10/24/2010  DISCHARGE SUMMARY  PRIMARY ADMITTING DIAGNOSIS: Chest pain.  ADDITIONAL/DISCHARGE DIAGNOSES:  1. Coronary artery disease.  2. New-onset angina.  3. Hypertension.  4. Hyperlipidemia.  5. Remote history of tobacco abuse.  6. Postoperative acute blood loss anemia.  PROCEDURES PERFORMED:  1. Cardiac catheterization.  2. Coronary artery bypass grafting x4 (left internal mammary artery to  the LAD, saphenous vein graft to the second diagonal, saphenous  vein graft to the first obtuse marginal, saphenous vein graft to  the posterior descending).  3. Endoscopic vein harvest, right leg  04/04/2011 f/u ov/[name] [name] cc new L lat localized hip pain worse standing up, 3-4 weeks comes and goes, not tried nsaids, maybe worse toward end of day rec Try advil up to 3 with meals to see if helps hip pain and if not better within a week call for referral to an orthopedic surgeon> referred     09/02/2016 acute extended ov/[name] re: neck pain  Chief Complaint  Patient presents with  . Acute Visit    Pt c/o feeling anxious recently. He states he has also noticed some dizziness when he bends over.  He states he has also noticed some soreness in the left side  of his neck for the past.   new onset  10 days positional  L neck shoots into L post shoulder  Tylenol helps/ heat helps / no pain with motion of L shoulder/ no numbness or weakness of arm or hand on L  Overdue for cpx  Not getting any ex at all but with ex denies sob or cp  rec Neck stretching x 6 motions, hold each one x 30 secs and repeat as frequently as you can but at least sev times a day especially at bedtime and on rising in am  Heat/ advil 200 up 3 with meals as needed (9 max per 24h)     10/24/16  Back injury at home leaning over to get something out of car > midline low back pain>  Ortho > shots > no more need for flex/advil     04/05/2017 acute  ov/[name] [name] re: new back pain in pt with hbp Chief Complaint  Patient presents with  . Acute Visit    shrap pain in left side for 1 week now   new left sided back pain acute onset x  two weeks prior to OV  When bending over to reach for luggage  Positional /better on lying down or sitting still  Started advil 3 x day some better No radicular features/ rash/ pain with cough or change with urination /bms Other back pain better p injection of pain  at ortho office  rec Tdap today  flexoril 10 mg up to 4 x daily as needed for muscle spasm Advil 3-4 with meals as needed for pain (= up to 12 per day)  Heating pad may help Call your orthopedic doctor for follow up     08/10/17  Neg Myoview       09/05/2018  f/u ov/[name] [name] re:  cpx Chief Complaint  Patient presents with  . Annual Exam    not fasting. Doing well and denies any co's.   Dyspnea:  Better with exertion , tol steps better, losing wt intentionally Cough: none  Sleeping: fine flat in bed  SABA use: non 02: none Back pain s radicular features, worse at bedtime  rec If blood pressure running on low side then break the telmisartan-hctz in half       11/12/2018  f/u ov/[name] [name] re: hbp / can't afford micardis 80-25  Chief Complaint  Patient presents with  . Follow-up     Discuss formulary- copay on telmisartan has went up.   Dyspnea:  No regular ex , sometimes doe when gets in a big hurry but no assoc cp Cough: no Sleeping: able to lie down flat ok  SABA use: none     No obvious day to day or daytime variability or assoc excess/ purulent sputum or mucus plugs or hemoptysis or cp or chest tightness, subjective wheeze or overt sinus or hb symptoms.   Sleeping as above  without nocturnal  or early am exacerbation  of respiratory  c/o's or need for noct saba. Also denies any obvious fluctuation of symptoms with weather or environmental changes or other aggravating or alleviating factors except as outlined above   No unusual exposure hx or h/o childhood pna/ asthma or knowledge of premature birth.  Current Allergies, Complete Past Medical History, Past Surgical History, Family History, and Social History were reviewed in [employer] record.  ROS  The following are not active complaints unless bolded Hoarseness, sore throat, dysphagia, dental problems, itching, sneezing,  nasal congestion or discharge of excess mucus or purulent secretions, ear ache,   fever, chills, sweats, unintended wt loss or wt gain, classically pleuritic or exertional cp,  orthopnea pnd or arm/hand swelling  or leg swelling, presyncope, palpitations, abdominal pain, anorexia, nausea, vomiting, diarrhea  or change in bowel habits or change in bladder habits, change in stools or change in urine, dysuria, hematuria,  rash, arthralgias, visual complaints, headache, numbness, weakness or ataxia or problems with walking or coordination,  change in mood or  memory.        Current Meds  Medication Sig  . acetaminophen (TYLENOL) 650 MG CR tablet Take 650 mg by mouth every 8 (eight) hours as needed for pain.  Marland Kitchen aspirin EC 81 MG tablet Take 1 tablet (81 mg total) by mouth daily.  . cyclobenzaprine (FLEXERIL) 10 MG tablet Take 1 tablet (10 mg total) by mouth 4 (four) times daily as  needed for muscle spasms.  . methocarbamol (ROBAXIN) 500 MG tablet Take 1 tablet (500 mg total) by mouth every 6 (six) hours as needed for muscle spasms.  . metoprolol tartrate (LOPRESSOR) 25 MG tablet TAKE 1 TABLET BY MOUTH TWICE A DAY  . nitroGLYCERIN (NITROSTAT) 0.4 MG SL tablet Place 1 tablet (0.4 mg total) under the tongue every 5 (five) minutes as needed for chest pain.  Marland Kitchen ondansetron (ZOFRAN ODT) 8 MG disintegrating tablet Take 1 tablet (8 mg total) by mouth every 8 (eight)  hours as needed for nausea or vomiting.  . simvastatin (ZOCOR) 40 MG tablet TAKE 1 TABLET BY MOUTH EVERYDAY AT BEDTIME  . telmisartan-hydrochlorothiazide (MICARDIS HCT) 80-25 MG tablet TAKE 1 TABLET BY MOUTH EVERY DAY  . tiZANidine (ZANAFLEX) 4 MG tablet Take 1 tablet (4 mg total) by mouth every 6 (six) hours as needed for muscle spasms.           Past Medical History from [facility]: S/p THR on L Blackman and Erskine Emery PA  HYPERTENSION, BENIGN ESSENTIAL, CONTROLLED (ICD-401.1)  OBESITY  - Target wt = 190 for BMI < 30  Colon polyps..............................................................................................  - colonoscopy 4/04, letter sent 05/15/07 reviewed with pt July 17, 2008 and August 03, 2009 > referred back to GI  CHEST PAIN (ICD-786.50) ....................................................................................  Nishan - s/p cabg 10/19/2010 HYPERLIPIDEMIA  - target LDL less than 70 based on documented ischemic heart disease  HEALTH MAINTENANCE..........................................................................................[name]  - TD 04/05/2017  - Prevnar 13 09/05/2018  - CPX   09/05/2018      Family History:  hypertension in mother and father no premature heart disease in any direct relatives nor stroke diabetes or cancer, except that his mother has developed leukemia  1 brother healthy  Neg prostate ca     Social History:  quit smoking about 1983 denies alcohol  use.  Works at a [employer]              Objective:   Physical Exam   amb pleasant bm nad  BP 140/74 (BP Location: Left Arm, Cuff Size: Normal)   Pulse (!) 58   Temp (!) 97.4 F (36.3 C) (Temporal)   Ht 5' 7.5" (1.715 m)   Wt 201 lb 9.6 oz (91.4 kg)   SpO2 98% Comment: on RA  BMI 31.11 kg/m     11/12/2018   201  09/05/2018  197  wt 201 November 20, 2007 > 205 April 18, 2008 > 184 01/03/2011 > 182 04/04/2011 > 07/04/2011  185  >189 09/19/2011 > 01/02/2012 194 >204 02/09/2012 >196 04/04/2012 > 04/25/2012 194 > 11/02/2012  193 > 01/19/2013  190 >  05/23/2014  193 > 05/25/2015 196 > 09/02/2016   200   > 04/05/2017   207 > 05/18/2017  99% RA> 09/04/2017 203>  03/08/2018   204     HEENT : pt wearing mask not removed for exam due to covid -19 concerns.    NECK :  without JVD/Nodes/TM/ nl carotid upstrokes bilaterally   LUNGS: no acc muscle use,  Nl contour chest which is clear to A and P bilaterally without cough on insp or exp maneuvers   CV:  RRR  no s3 or murmur or increase in P2, and no edema   ABD:  soft and nontender with nl inspiratory excursion in the supine position. No bruits or organomegaly appreciated, bowel sounds nl  MS:  Nl gait/ ext warm without deformities, calf tenderness, cyanosis or clubbing No obvious joint restrictions   SKIN: warm and dry without lesions    NEURO:  alert, approp, nl sensorium with  no motor or cerebellar deficits apparent.       Labs ordered/ reviewed:      Chemistry      Component Value Date/Time   NA 137 11/12/2018 0934   NA 140 08/08/2017 0901   K 3.5 11/12/2018 0934   CL 99 11/12/2018 0934   CO2 30 11/12/2018 0934   BUN 19 11/12/2018 0934   BUN 21 08/08/2017 0901  CREATININE 1.04 11/12/2018 0934      Component Value Date/Time   CALCIUM 9.9 11/12/2018 0934                                  Lab Results  Component Value Date   WBC 5.2 11/12/2018   HGB 12.8 (L) 11/12/2018   HCT 38.2 (L) 11/12/2018   MCV 93.6  11/12/2018   PLT 250.0 11/12/2018      EOS                                                                0.1                                    11/12/2018         Assessment:

## 2018-11-12 NOTE — Patient Instructions (Signed)
Finish Chief Operating Officer and start losartan-hctz one daily in its place and monitor your blood pressure daily    Please remember to go to the lab department   for your tests - we will call you with the results when they are available.      Keep previous appt

## 2018-11-12 NOTE — Progress Notes (Signed)
Spoke with pt and notified of results per Dr. [name]. Pt verbalized understanding and denied any questions. 

## 2018-11-12 NOTE — Assessment & Plan Note (Addendum)
Lab Results  Component Value Date   CREATININE 1.04 11/12/2018   CREATININE 1.19 09/05/2018   CREATININE 1.07 03/08/2018    Adequate control on present rx, reviewed in detail with pt > formulary reviewed:  Losartan 100-25 best option so changed to this and will continue to self monitor  Also needs more regular aerobic exercise / reviewed  >>>   F/u q 6 m, sooner prn

## 2018-11-12 NOTE — Assessment & Plan Note (Signed)
No eos, adequate control on present rx, reviewed in detail with pt > no change in rx needed  > otc's without pseudofed rec  Each maintenance medication was reviewed in detail including most importantly the difference between maintenance and as needed and under what circumstances the prns are to be used.  Please see AVS for specific  Instructions which are unique to this visit and I personally typed out  which were reviewed in detail in writing with the patient and a copy provided.

## 2018-11-23 DIAGNOSIS — Z20828 Contact with and (suspected) exposure to other viral communicable diseases: Secondary | ICD-10-CM | POA: Diagnosis not present

## 2019-03-08 ENCOUNTER — Ambulatory Visit: Payer: BC Managed Care – PPO | Admitting: Internal Medicine

## 2019-03-09 ENCOUNTER — Other Ambulatory Visit: Payer: Self-pay | Admitting: Internal Medicine

## 2019-03-15 ENCOUNTER — Encounter: Payer: Self-pay | Admitting: Internal Medicine

## 2019-03-15 ENCOUNTER — Ambulatory Visit (INDEPENDENT_AMBULATORY_CARE_PROVIDER_SITE_OTHER): Payer: BC Managed Care – PPO | Admitting: Internal Medicine

## 2019-03-15 ENCOUNTER — Other Ambulatory Visit: Payer: Self-pay

## 2019-03-15 DIAGNOSIS — I10 Essential (primary) hypertension: Secondary | ICD-10-CM

## 2019-03-15 DIAGNOSIS — E7800 Pure hypercholesterolemia, unspecified: Secondary | ICD-10-CM | POA: Diagnosis not present

## 2019-03-15 NOTE — Patient Instructions (Signed)
To get the most out of exercise, you need to be continuously aware that you are short of breath, but never out of breath, for 30 minutes  3 x weekly.  As you improve, it will actually be easier for you to do the same amount of exercise  in  30 minutes so always push to the level where you are short of breath.   cpx due after 09/05/19

## 2019-03-15 NOTE — Assessment & Plan Note (Signed)
Lab Results  Component Value Date   CREATININE 1.04 11/12/2018   CREATININE 1.19 09/05/2018   CREATININE 1.07 03/08/2018     Adequate control on present rx, reviewed in detail with pt > no change in rx needed    cpx due 08/2019

## 2019-03-15 NOTE — Assessment & Plan Note (Signed)
Target LDL < 70 since has known CAD  Lab Results  Component Value Date   CHOL 144 09/05/2018   HDL 32.20 (L) 09/05/2018   LDLCALC 90 09/05/2018   TRIG 109.0 09/05/2018   CHOLHDL 4 09/05/2018    Main concern is lack of regular aerobic ex/ reviewed with pt  F/u for cpx 08/2019 - no change in meds in meantime          Each maintenance medication was reviewed in detail including emphasizing most importantly the difference between maintenance and prns and under what circumstances the prns are to be triggered using an action plan format where appropriate.  Total time for H and P, chart review, counseling,  and generating customized AVS unique to this office visit / charting = 20 min

## 2019-03-15 NOTE — Progress Notes (Signed)
Patient ID: [name] [name], male   DOB: 26-Dec-1955   MRN: [id number]   Brief patient profile:  83   yobm quit smoking in early 1980's with hypertension hyperlipidemia and atypical chest pain with a left heart catheter showing 60% obstruction of the right-sided branches in the 1999 and a negative stress study in May of 2007> cabg required 10/2010    History of Present Illness  08/12/2010 ov/Caterine Mcmeans  Cc new onset cp comes and goes x 1 week so went to ER July 30th with neg w/u and much less cp since er.  Location = Left of sternum only, aching but   No relation to activity, lasts no more than a few seconds, not noticeable supine or sleeping.  Does ok with exercise with no pains. rec Cardiac eval    DATE OF ADMISSION: 10/18/2010  DATE OF DISCHARGE: 10/24/2010  DISCHARGE SUMMARY  PRIMARY ADMITTING DIAGNOSIS: Chest pain.  ADDITIONAL/DISCHARGE DIAGNOSES:  1. Coronary artery disease.  2. New-onset angina.  3. Hypertension.  4. Hyperlipidemia.  5. Remote history of tobacco abuse.  6. Postoperative acute blood loss anemia.  PROCEDURES PERFORMED:  1. Cardiac catheterization.  2. Coronary artery bypass grafting x4 (left internal mammary artery to  the LAD, saphenous vein graft to the second diagonal, saphenous  vein graft to the first obtuse marginal, saphenous vein graft to  the posterior descending).  3. Endoscopic vein harvest, right leg  04/04/2011 f/u ov/Cincere Deprey cc new L lat localized hip pain worse standing up, 3-4 weeks comes and goes, not tried nsaids, maybe worse toward end of day rec Try advil up to 3 with meals to see if helps hip pain and if not better within a week call for referral to an orthopedic surgeon> referred     09/02/2016 acute extended ov/Rebeccah Ivins re: neck pain  Chief Complaint  Patient presents with  . Acute Visit    Pt c/o feeling anxious recently. He states he has also noticed some dizziness when he bends over.  He states he has also noticed some soreness in the left side  of his neck for the past.   new onset  10 days positional  L neck shoots into L post shoulder  Tylenol helps/ heat helps / no pain with motion of L shoulder/ no numbness or weakness of arm or hand on L  Overdue for cpx  Not getting any ex at all but with ex denies sob or cp  rec Neck stretching x 6 motions, hold each one x 30 secs and repeat as frequently as you can but at least sev times a day especially at bedtime and on rising in am  Heat/ advil 200 up 3 with meals as needed (9 max per 24h)     10/24/16  Back injury at home leaning over to get something out of car > midline low back pain>  Ortho > shots > no more need for flex/advil     04/05/2017 acute  ov/Lavern Crimi re: new back pain in pt with hbp Chief Complaint  Patient presents with  . Acute Visit    shrap pain in left side for 1 week now   new left sided back pain acute onset x  two weeks prior to OV  When bending over to reach for luggage  Positional /better on lying down or sitting still  Started advil 3 x day some better No radicular features/ rash/ pain with cough or change with urination /bms Other back pain better p injection of pain  at ortho office  rec Tdap today  flexoril 10 mg up to 4 x daily as needed for muscle spasm Advil 3-4 with meals as needed for pain (= up to 12 per day)  Heating pad may help Call your orthopedic doctor for follow up     08/10/17  Neg Myoview     09/05/2018  f/u ov/Evelynn Hench re:  cpx Chief Complaint  Patient presents with  . Annual Exam    not fasting. Doing well and denies any co's.   Dyspnea:  Better with exertion , tol steps better, losing wt intentionally Cough: none  Sleeping: fine flat in bed  SABA use: non 02: none Back pain s radicular features, worse at bedtime  rec If blood pressure running on low side then break the telmisartan-hctz in half       11/12/2018  f/u ov/Bryon Parker re: hbp / can't afford micardis 80-25  Chief Complaint  Patient presents with  . Follow-up    Discuss  formulary- copay on telmisartan has went up.   Dyspnea:  No regular ex , sometimes doe when gets in a big hurry but no assoc cp Cough: no Sleeping: able to lie down flat ok  SABA use: none rec Finish your telmasartan and start losartan-hctz one daily in its place and monitor your blood pressure daily     03/15/2019  f/u ov/Irianna Gilday re:  HBP/ has had both covid vaccines Moderna last one Feb 4th  Chief Complaint  Patient presents with  . Follow-up    Essential Hypertension  Dyspnea:  No regular exercise  Cough: no Sleeping: able to lie flat SABA use: no 02: no   No obvious day to day or daytime variability or assoc excess/ purulent sputum or mucus plugs or hemoptysis or cp or chest tightness, subjective wheeze or overt sinus or hb symptoms.   Sleeping without nocturnal  or early am exacerbation  of respiratory  c/o's or need for noct saba. Also denies any obvious fluctuation of symptoms with weather or environmental changes or other aggravating or alleviating factors except as outlined above   No unusual exposure hx or h/o childhood pna/ asthma or knowledge of premature birth.  Current Allergies, Complete Past Medical History, Past Surgical History, Family History, and Social History were reviewed in [employer] record.  ROS  The following are not active complaints unless bolded Hoarseness, sore throat, dysphagia, dental problems, itching, sneezing,  nasal congestion or discharge of excess mucus or purulent secretions, ear ache,   fever, chills, sweats, unintended wt loss or wt gain, classically pleuritic or exertional cp,  orthopnea pnd or arm/hand swelling  or leg swelling, presyncope, palpitations, abdominal pain, anorexia, nausea, vomiting, diarrhea  or change in bowel habits or change in bladder habits, change in stools or change in urine, dysuria, hematuria,  rash, arthralgias, visual complaints, headache, numbness, weakness or ataxia or problems with walking or  coordination,  change in mood or  memory.        Current Meds  Medication Sig  . acetaminophen (TYLENOL) 650 MG CR tablet Take 650 mg by mouth every 8 (eight) hours as needed for pain.  Marland Kitchen aspirin EC 81 MG tablet Take 1 tablet (81 mg total) by mouth daily.  . cyclobenzaprine (FLEXERIL) 10 MG tablet Take 1 tablet (10 mg total) by mouth 4 (four) times daily as needed for muscle spasms.  Marland Kitchen losartan-hydrochlorothiazide (HYZAAR) 100-25 MG tablet Take 1 tablet by mouth daily.  . methocarbamol (ROBAXIN) 500 MG tablet  Take 1 tablet (500 mg total) by mouth every 6 (six) hours as needed for muscle spasms.  . metoprolol tartrate (LOPRESSOR) 25 MG tablet TAKE 1 TABLET BY MOUTH TWICE A DAY  . nitroGLYCERIN (NITROSTAT) 0.4 MG SL tablet Place 1 tablet (0.4 mg total) under the tongue every 5 (five) minutes as needed for chest pain.  Marland Kitchen ondansetron (ZOFRAN ODT) 8 MG disintegrating tablet Take 1 tablet (8 mg total) by mouth every 8 (eight) hours as needed for nausea or vomiting.  . simvastatin (ZOCOR) 40 MG tablet TAKE 1 TABLET BY MOUTH EVERYDAY AT BEDTIME  . tiZANidine (ZANAFLEX) 4 MG tablet Take 1 tablet (4 mg total) by mouth every 6 (six) hours as needed for muscle spasms.                Past Medical History from [facility]: S/p THR on L Blackman and Erskine Emery PA  HYPERTENSION, BENIGN ESSENTIAL, CONTROLLED (ICD-401.1)  OBESITY  - Target wt = 190 for BMI < 30  Colon polyps..............................................................................................  - colonoscopy 4/04, letter sent 05/15/07 reviewed with pt July 17, 2008 and August 03, 2009 > referred back to GI  CHEST PAIN (ICD-786.50) ....................................................................................  [facility] - s/p cabg 10/19/2010 HYPERLIPIDEMIA  - target LDL less than 70 based on documented ischemic heart disease  HEALTH  MAINTENANCE..........................................................................................[name]  - TD 04/05/2017  - Prevnar 13 09/05/2018  - Covid 17 moderna completed Feb 14 2019  - CPX   09/05/2018      Family History:  hypertension in mother and father no premature heart disease in any direct relatives nor stroke diabetes or cancer, except that his mother has developed leukemia  1 brother healthy  Neg prostate ca     Social History:  quit smoking about 1983 denies alcohol use.  Works at a [employer]           Objective:   Physical Exam   Amb pleasant bm nad   Vital signs reviewed  03/15/2019  - Note at rest 02 sats  98% on RA and bp 118/64      03/15/2019     201 11/12/2018   201  09/05/2018  197  wt 201 November 20, 2007 > 205 April 18, 2008 > 184 01/03/2011 > 182 04/04/2011 > 07/04/2011  185  >189 09/19/2011 > 01/02/2012 194 >204 02/09/2012 >196 04/04/2012 > 04/25/2012 194 > 11/02/2012  193 > 01/19/2013  190 >  05/23/2014  193 > 05/25/2015 196 > 09/02/2016   200   > 04/05/2017   207 > 05/18/2017  99% RA> 09/04/2017 203>  03/08/2018   204      HEENT : pt wearing mask not removed for exam due to covid -19 concerns.    NECK :  without JVD/Nodes/TM/ nl carotid upstrokes bilaterally   LUNGS: no acc muscle use,  Nl contour chest which is clear to A and P bilaterally without cough on insp or exp maneuvers   CV:  RRR  no s3 or murmur or increase in P2, and no edema   ABD:  soft and nontender with nl inspiratory excursion in the supine position. No bruits or organomegaly appreciated, bowel sounds nl  MS:  Nl gait/ ext warm without deformities, calf tenderness, cyanosis or clubbing No obvious joint restrictions   SKIN: warm and dry without lesions    NEURO:  alert, approp, nl sensorium with  no motor or cerebellar deficits apparent.        Assessment:

## 2019-03-28 IMAGING — MR MR LUMBAR SPINE W/O CM
4 of 5 series · 18 of 48 positions shown · non-contrast
Comparison: Prior radiograph from 10/25/2016.

CLINICAL DATA: Initial evaluation for acute right-sided lower back
pain.

EXAM:
MRI LUMBAR SPINE WITHOUT CONTRAST
TECHNIQUE: Multiplanar, multisequence MR imaging of the lumbar spine was
performed. No intravenous contrast was administered.

[Series 3: T1 · sagittal · 4.0mm · 0.51mm/px · 3 of 14 slices shown (1 of 2)]
[im 3/14]
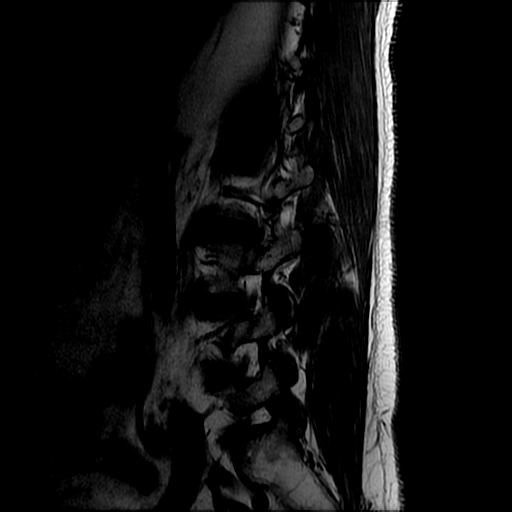
[im 8/14]
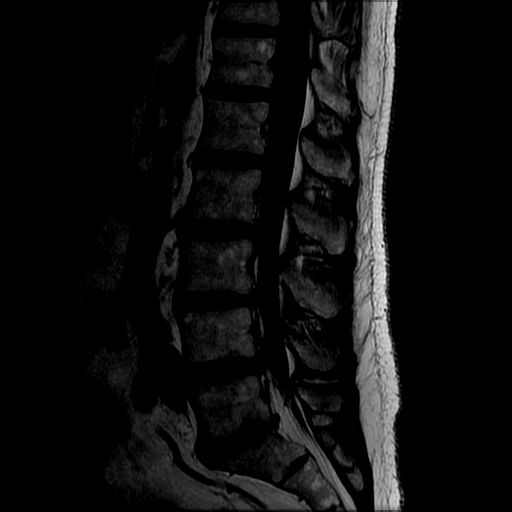
[im 14/14]
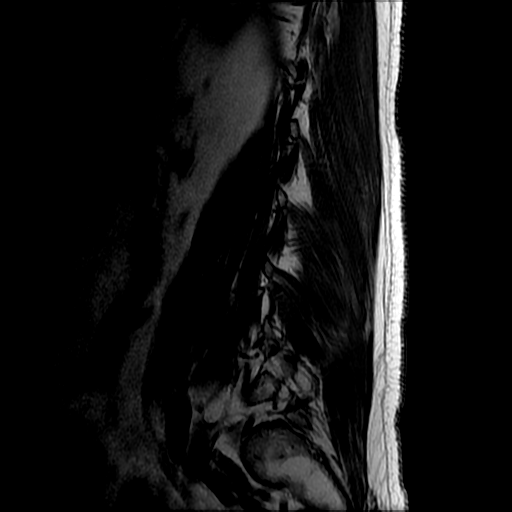

[Series 4: T2 · sagittal · 4.0mm · 0.51mm/px · 6 of 14 slices shown (1 of 2)]
[im 1/14]
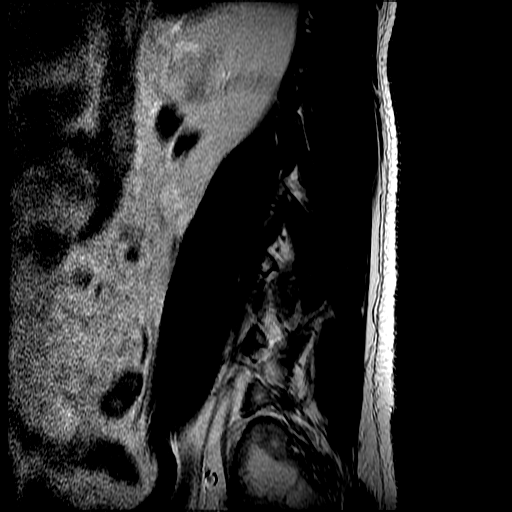
[im 3/14]
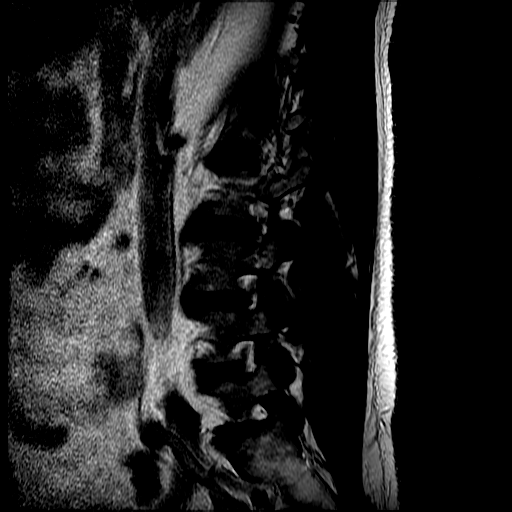
[im 6/14]
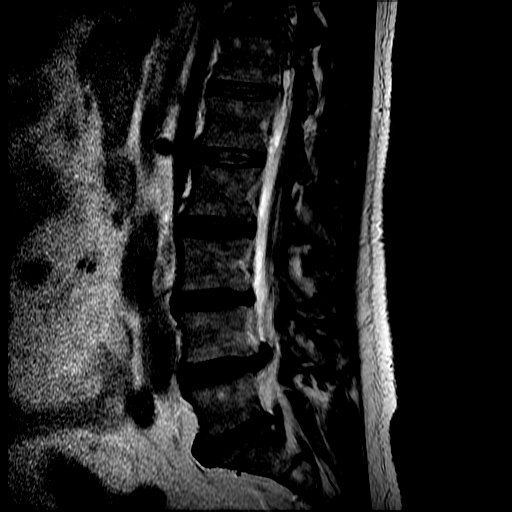
[im 8/14]
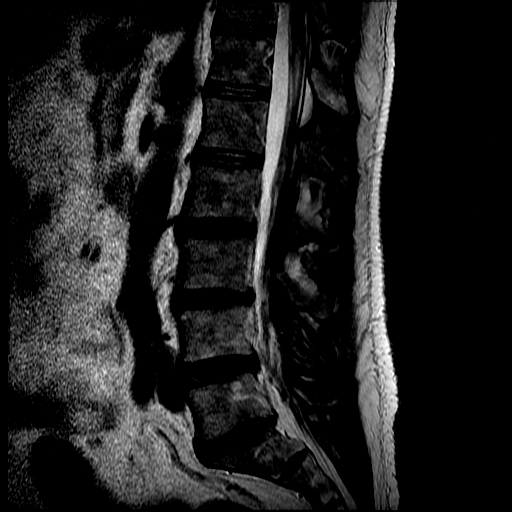
[im 11/14]
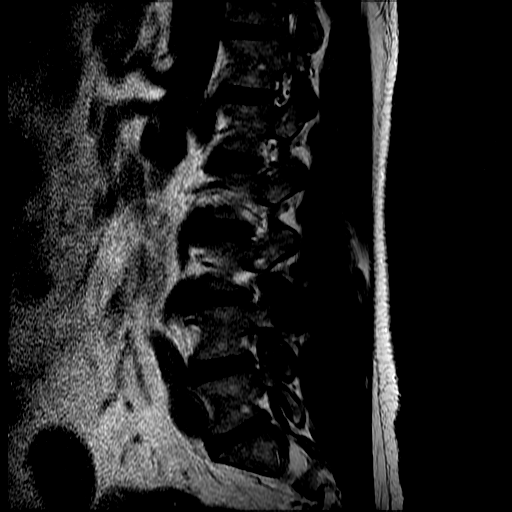
[im 14/14]
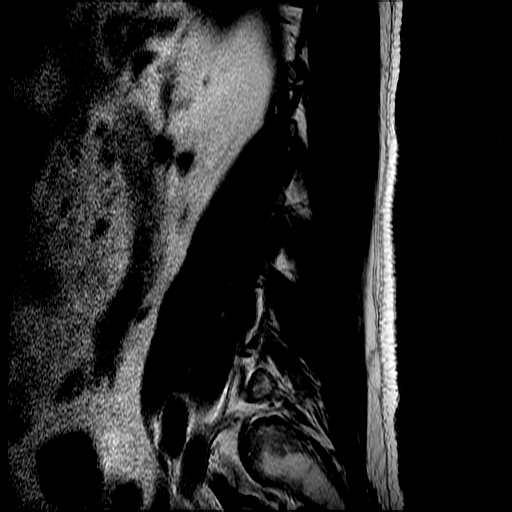

[Series 6: T2 · axial · 4.0mm · 0.39mm/px · z∈[-78,+88]mm · 6 of 35 slices shown (2 of 2)]
[im 1/35]
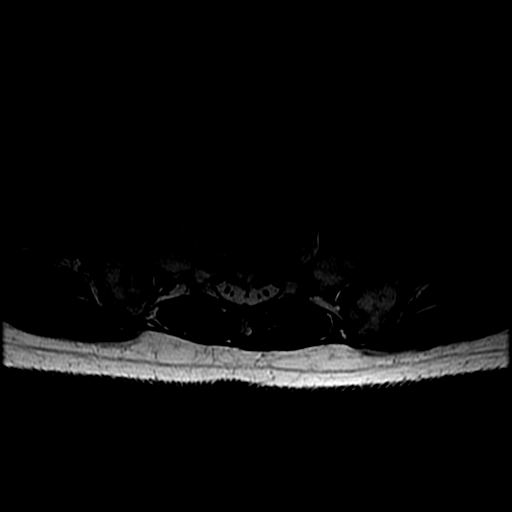
[im 5/35]
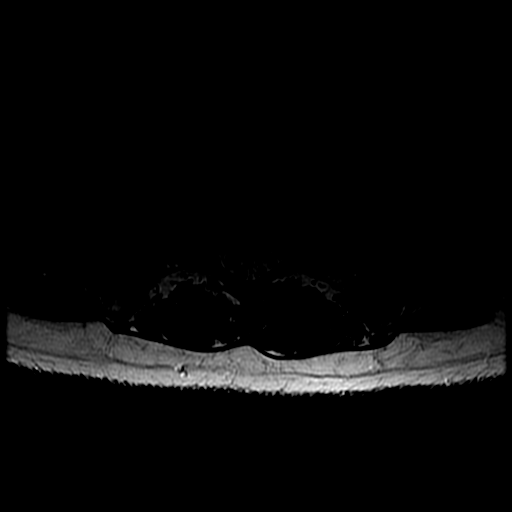
[im 10/35]
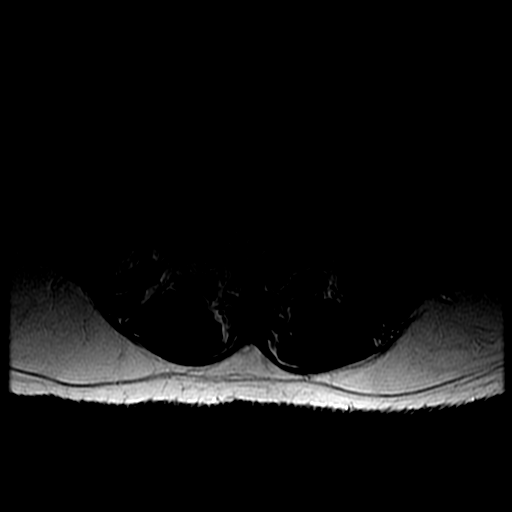
[im 15/35]
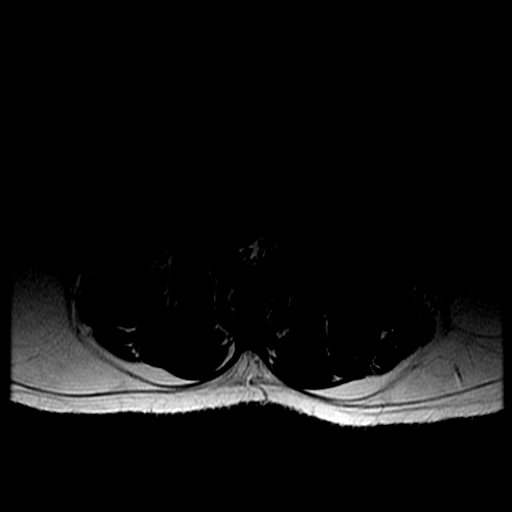
[im 18/35]
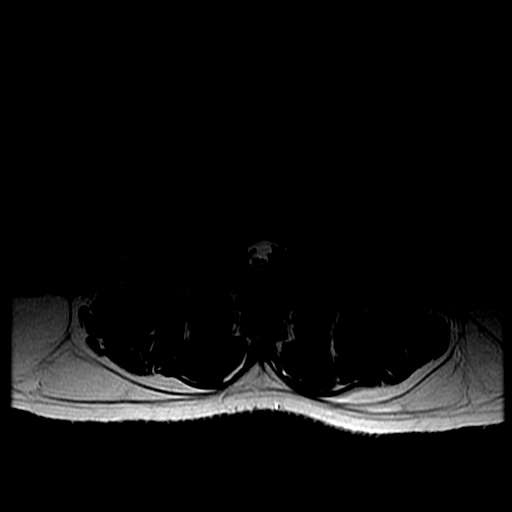
[im 30/35]
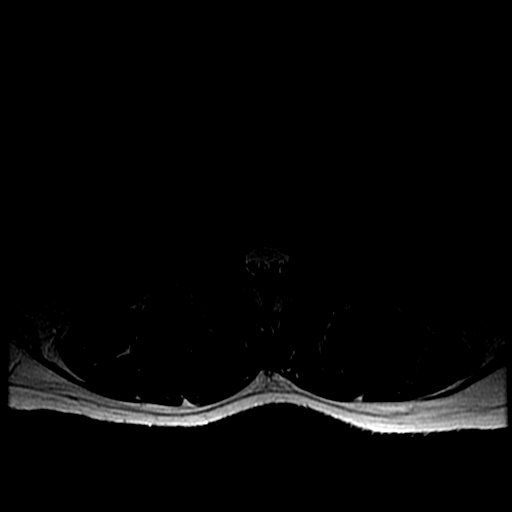

[Series 7: T1 · axial · 4.0mm · 0.39mm/px · z∈[-58,+88]mm · 3 of 35 slices shown (2 of 2)]
[im 5/35]
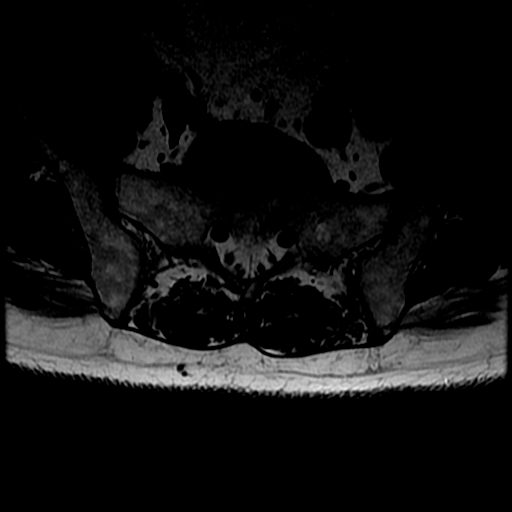
[im 18/35]
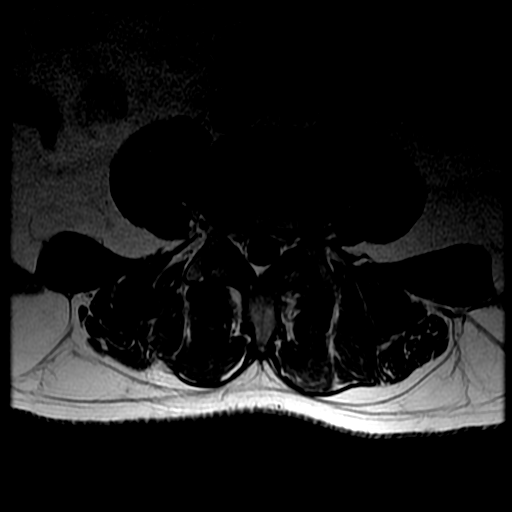
[im 30/35]
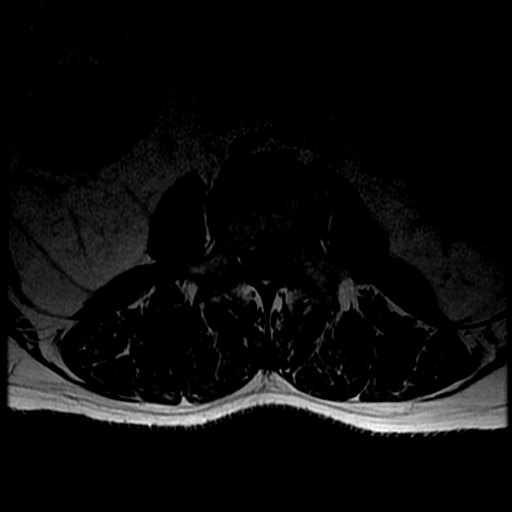

[18 of 48 positions shown; findings below may reference images not displayed]

FINDINGS: Segmentation: Normal segmentation. Lowest well-formed disc labeled
the L5-S1 level.

Alignment: Normal alignment with preservation of the normal lumbar
lordosis. No listhesis.

Vertebrae: Vertebral body heights maintained. No evidence for acute
or chronic fracture. Bone marrow signal intensity within normal
limits. No discrete or worrisome osseous lesions. No abnormal marrow
edema.

Conus medullaris: Extends to the T12 level and appears normal.

Paraspinal and other soft tissues: Paraspinous soft tissues within
normal limits. Visualized visceral structures are normal.

Disc levels:

L1-2:  Unremarkable.

L2-3: Mild diffuse disc bulge with disc desiccation. Superimposed
right foraminal/far lateral disc protrusion with associated annular
fissure (Series 6, image 10). Protruding disc contacts the exiting
right L2 nerve root as it courses out of the neural foramen. Mild
facet ligamentum flavum hypertrophy. No significant canal or
foraminal stenosis.

L3-4: Mild diffuse disc bulge with disc desiccation. Mild facet and
ligamentum flavum hypertrophy, greater on the left. No stenosis or
impingement.

L4-5: Disc desiccation with mild disc bulge. Superimposed right
subarticular disc extrusion with slight superior migration (series
4, image 6). Superior migration measures approximately 6 mm relative
to the parent L4-5 interspace. Disc extrusion itself measures
approximately in 14 mm. Extruded disc contacts and displaces the
descending right L5 nerve root (series 6, image 22). Mild to
moderate canal stenosis. Foramina remain patent.

L5-S1: Diffuse disc bulge with intervertebral disc space narrowing.
Mild chronic reactive endplate changes with marginal endplate
osteophytic spurring. Superimposed central disc protrusion indents
the ventral thecal sac, closely approximating the descending S1
nerve roots without impingement or displacement (series 7, image
29). Superimposed epidural lipomatosis. No significant stenosis.
IMPRESSION: 1. Right subarticular disc extrusion at L4-5 with secondary right L5
nerve root impingement.
2. Shallow right foraminal/extraforaminal disc protrusion at L2-3,
contacting and potentially irritating the exiting right L2 nerve
root.
3. Central disc protrusion at L5-S1, closely approximating the
descending S1 nerve roots without neural impingement or
displacement.

## 2019-07-10 ENCOUNTER — Telehealth: Payer: Self-pay | Admitting: Internal Medicine

## 2019-07-10 NOTE — Telephone Encounter (Signed)
Patient contacted and sent to urgent care per Dr. Gustavus Bryant recommendation. Patient will call us back with update.

## 2019-07-10 NOTE — Telephone Encounter (Signed)
I am sorry but if he already tried to reach urology and they can't see him then he needs to go to UC as can't call in anything for this from the office.

## 2019-07-10 NOTE — Telephone Encounter (Signed)
Patient reporting left flank pain, started 07/09/2019 AM, history of kidney stone in 2017, reports a similar pain. Taking advil and tylenol, voiding ok. Unable to get in with urology since he has not seen them since 2017. He reports the pain is sharp and cutting. Please advise.

## 2019-07-15 ENCOUNTER — Emergency Department (HOSPITAL_COMMUNITY)
Admission: EM | Admit: 2019-07-15 | Discharge: 2019-07-15 | Disposition: A | Discharge status: Home / Self Care | Payer: 59 | Attending: Emergency Medicine | Admitting: Emergency Medicine

## 2019-07-15 ENCOUNTER — Encounter (HOSPITAL_COMMUNITY): Payer: Self-pay | Admitting: Emergency Medicine

## 2019-07-15 ENCOUNTER — Emergency Department (HOSPITAL_COMMUNITY): Payer: 59

## 2019-07-15 ENCOUNTER — Other Ambulatory Visit: Payer: Self-pay

## 2019-07-15 DIAGNOSIS — I10 Essential (primary) hypertension: Secondary | ICD-10-CM | POA: Insufficient documentation

## 2019-07-15 DIAGNOSIS — I2510 Atherosclerotic heart disease of native coronary artery without angina pectoris: Secondary | ICD-10-CM | POA: Insufficient documentation

## 2019-07-15 DIAGNOSIS — Z87891 Personal history of nicotine dependence: Secondary | ICD-10-CM | POA: Insufficient documentation

## 2019-07-15 DIAGNOSIS — Z7982 Long term (current) use of aspirin: Secondary | ICD-10-CM | POA: Insufficient documentation

## 2019-07-15 DIAGNOSIS — M545 Low back pain, unspecified: Secondary | ICD-10-CM

## 2019-07-15 DIAGNOSIS — Z79899 Other long term (current) drug therapy: Secondary | ICD-10-CM | POA: Diagnosis not present

## 2019-07-15 LAB — URINALYSIS, ROUTINE W REFLEX MICROSCOPIC
Bilirubin Urine: NEGATIVE
Glucose, UA: NEGATIVE mg/dL
Hgb urine dipstick: NEGATIVE
Ketones, ur: NEGATIVE mg/dL
Leukocytes,Ua: NEGATIVE
Nitrite: NEGATIVE
Protein, ur: NEGATIVE mg/dL
Specific Gravity, Urine: 1.017 (ref 1.005–1.030)
pH: 6 (ref 5.0–8.0)

## 2019-07-15 MED ORDER — OXYCODONE-ACETAMINOPHEN 5-325 MG PO TABS
1.0000 | ORAL_TABLET | Freq: Once | ORAL | Status: AC
  Administered 2019-07-15: 1 via ORAL
  Filled 2019-07-15: qty 1

## 2019-07-15 MED ORDER — METHOCARBAMOL 500 MG PO TABS
500.0000 mg | ORAL_TABLET | Freq: Once | ORAL | Status: AC
  Administered 2019-07-15: 500 mg via ORAL
  Filled 2019-07-15: qty 1

## 2019-07-15 MED ORDER — OXYCODONE-ACETAMINOPHEN 5-325 MG PO TABS: 1.0000 | ORAL_TABLET | Freq: Three times a day (TID) | ORAL | 0 refills | Status: DC | PRN

## 2019-07-15 MED ORDER — METHOCARBAMOL 500 MG PO TABS
500.0000 mg | ORAL_TABLET | Freq: Two times a day (BID) | ORAL | 0 refills | Status: DC
Start: 2019-07-15 — End: 2019-10-17

## 2019-07-15 MED ORDER — LIDOCAINE 5 % EX PTCH
1.0000 | MEDICATED_PATCH | CUTANEOUS | Status: DC
  Administered 2019-07-15: 1 via TRANSDERMAL
  Filled 2019-07-15: qty 1

## 2019-07-15 NOTE — ED Triage Notes (Signed)
Pt woke up last Tuesday with lower back pains.denies falls or injuries or lifting. Went to [facility] on Wed. And was prescribed medications for pain and not helping and ruled out kidney stone. Pt report pain is worse with movement.

## 2019-07-15 NOTE — Discharge Instructions (Addendum)
Please call tomorrow to schedule follow-up appointment with the orthopedist you seen previously for your back issues.  In the meantime you can take Percocet 1 tablet every 8 hours, you can also use prescribed Robaxin.  These medications can cause drowsiness, do not drive or operate machinery while using these medications.  You can take 650 mg of Tylenol every 8 hours with Percocet as this does contain a small amount of Tylenol also.  You can also apply over-the-counter salon pas lidocaine patches, they should be applied for 12 hours and then remove for 12 hours.  If you develop numbness, weakness, difficulty controlling your bowels or bladder possibly, significantly worsened pain or any other new or concerning symptoms please return to the ED for evaluation.

## 2019-07-15 NOTE — ED Provider Notes (Signed)
[facility] DEPT Provider Note   CSN: [id number] Arrival date & time: 07/15/19  4010     History Chief Complaint  Patient presents with  . Back Pain    [name] [name] is a 64 y.o. male.  [name] [name] is a 64 y.o. male with a history of CAD, hypertension, hyperlipidemia, kidney stones, herniated disc, who presents to the emergency department for evaluation of low back pain.  Patient reports pain started last Tuesday.  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  Pain does not radiate down into the leg.  It is not associated with numbness, tingling or weakness.  No loss of bowel or bladder control or saddle anesthesia.  No associated dysuria, no hematuria noted.  He was seen at urgent care on Wednesday, they did a urinalysis and he thinks they did an abdominal x-ray to look for a kidney stone, did not see signs of a stone, they sent him home on cyclobenzaprine and he is also intermittently been taking Tylenol, but he reports minimal relief in pain, today it was difficult for him to get out of bed.  He reports pain is primarily worse with movement.  No other aggravating or alleviating factors.        Past Medical History:  Diagnosis Date  . Arthritis   . Blood clot in vein    superficial / right leg following CABG  . CAD (coronary artery disease)   . Chest pain   . Colon polyps   . Complication of anesthesia    hard to wake up  . Essential hypertension, benign   . Headache    migraines as teenager  . Heart murmur   . History of blood transfusion   . History of kidney stones   . Hyperlipemia   . Obesity   . Tobacco abuse     Patient Active Problem List   Diagnosis Date Noted  . Anxiety disorder 03/10/2018  . Acute low back pain without sciatica 10/31/2016   . Musculoskeletal neck pain 09/03/2016  . Osteoarthritis of left hip 05/29/2015  . Status post total replacement of left hip 05/29/2015  . Hematuria, microscopic 05/25/2014  . Acute upper respiratory infections of unspecified site 01/18/2013  . Left carotid bruit 11/14/2012  . Hot flashes 11/04/2012  . Health care maintenance 04/25/2012  . Left groin pain 04/04/2012  . Hip pain, left 04/06/2011  . Hx of CABG 11/05/2010  . CAD (coronary artery disease)   . COLONIC POLYPS 05/26/2007  . OBESITY 05/26/2007  . Essential hypertension 05/26/2007  . Chronic ischemic heart disease 05/26/2007  . HEMORRHOIDS, INTERNAL 05/26/2007  . Allergic rhinitis 05/26/2007  . CONSTIPATION, CHRONIC 05/26/2007  . GASTROINTESTINAL HEMORRHAGE, HX OF 05/26/2007  . TOBACCO USE, QUIT 05/26/2007  . HYPERCHOLESTEROLEMIA 05/09/2007  . BLADDER NECK OBSTRUCTION 05/09/2007    Past Surgical History:  Procedure Laterality Date  . CARDIAC CATHETERIZATION    . CIRCUMCISION  1986  . CORONARY ARTERY BYPASS GRAFT    . Coronary artery bypass grafting x4  10/21/2010  . CYSTOSCOPY WITH RETROGRADE PYELOGRAM, URETEROSCOPY AND STENT PLACEMENT Left 09/01/2014   Procedure: CYSTOSCOPY WITH RETROGRADE PYELOGRAM, URETEROSCOPY AND STENT PLACEMENT;  Surgeon: Irine Seal, MD;  Location: [facility];  Service: Urology;  Laterality: Left;  .  CYSTOSCOPY WITH URETEROSCOPY AND STENT PLACEMENT Left 09/09/2014   Procedure: CYSTOSCOPY,LEFT URETEROSCOPY AND STENT PLACEMENT;  Surgeon: Irine Seal, MD;  Location: [facility];  Service: Urology;  Laterality: Left;  . HEMORRHOID SURGERY    . HOLMIUM LASER APPLICATION Left 1/61/0960   Procedure: WITH HOLMIUM LASER ;  Surgeon: Irine Seal, MD;  Location: [facility];  Service: Urology;  Laterality: Left;  . Stapled hemorrhoidopexy.  10/19/2006  . TOTAL HIP ARTHROPLASTY Left 05/29/2015   Procedure: LEFT TOTAL HIP ARTHROPLASTY ANTERIOR APPROACH;  Surgeon: Mcarthur Rossetti, MD;  Location: [facility];  Service:  Orthopedics;  Laterality: Left;  Marland Kitchen VASECTOMY  1986       Family History  Problem Relation Age of Onset  . Hypertension Father   . Leukemia Mother   . Hypertension Mother   . Colon cancer Neg Hx     Social History   Tobacco Use  . Smoking status: Former Smoker    Packs/day: 0.50    Years: 7.00    Pack years: 3.50    Types: Cigarettes    Quit date: 01/10/1981    Years since quitting: 38.5  . Smokeless tobacco: Never Used  Vaping Use  . Vaping Use: Never used  Substance Use Topics  . Alcohol use: No    Alcohol/week: 0.0 standard drinks  . Drug use: No    Home Medications Prior to Admission medications   Medication Sig Start Date End Date Taking? Authorizing Provider  acetaminophen (TYLENOL) 650 MG CR tablet Take 650 mg by mouth every 8 (eight) hours as needed for pain.    [provider]  aspirin EC 81 MG tablet Take 1 tablet (81 mg total) by mouth daily. 08/08/17   Burtis Junes, NP  cyclobenzaprine (FLEXERIL) 10 MG tablet Take 1 tablet (10 mg total) by mouth 4 (four) times daily as needed for muscle spasms. 04/05/17   Tanda Rockers, MD  losartan-hydrochlorothiazide (HYZAAR) 100-25 MG tablet Take 1 tablet by mouth daily. 11/12/18   Tanda Rockers, MD  methocarbamol (ROBAXIN) 500 MG tablet Take 1 tablet (500 mg total) by mouth 2 (two) times daily. 07/15/19   Jacqlyn Larsen, PA-C  metoprolol tartrate (LOPRESSOR) 25 MG tablet TAKE 1 TABLET BY MOUTH TWICE A DAY 03/11/19   Tanda Rockers, MD  nitroGLYCERIN (NITROSTAT) 0.4 MG SL tablet Place 1 tablet (0.4 mg total) under the tongue every 5 (five) minutes as needed for chest pain. 08/08/17   Burtis Junes, NP  ondansetron (ZOFRAN ODT) 8 MG disintegrating tablet Take 1 tablet (8 mg total) by mouth every 8 (eight) hours as needed for nausea or vomiting. 09/01/14   Charlann Lange, PA-C  oxyCODONE-acetaminophen (PERCOCET/ROXICET) 5-325 MG tablet Take 1 tablet by mouth every 8 (eight) hours as needed for severe pain. 07/15/19    Jacqlyn Larsen, PA-C  simvastatin (ZOCOR) 40 MG tablet TAKE 1 TABLET BY MOUTH EVERYDAY AT BEDTIME 09/06/18   Tanda Rockers, MD  tiZANidine (ZANAFLEX) 4 MG tablet Take 1 tablet (4 mg total) by mouth every 6 (six) hours as needed for muscle spasms. 10/25/16   Leandrew Koyanagi, MD    Allergies    Codeine  Review of Systems   Review of Systems  Constitutional: Negative for chills and fever.  HENT: Negative.   Respiratory: Negative for shortness of breath.   Cardiovascular: Negative for chest pain.  Gastrointestinal: Negative for abdominal pain, constipation, diarrhea, nausea and vomiting.  Genitourinary: Negative for dysuria, flank pain, frequency and  hematuria.  Musculoskeletal: Positive for back pain. Negative for arthralgias, gait problem, joint swelling, myalgias and neck pain.  Skin: Negative for color change, rash and wound.  Neurological: Negative for weakness and numbness.    Physical Exam Updated Vital Signs BP 124/76   Pulse 83   Temp 99.3 F (37.4 C) (Oral)   Resp 16   SpO2 96%   Physical Exam Vitals and nursing note reviewed.  Constitutional:      General: He is not in acute distress.    Appearance: Normal appearance. He is well-developed and normal weight. He is not ill-appearing or diaphoretic.  HENT:     Head: Atraumatic.  Eyes:     General:        Right eye: No discharge.        Left eye: No discharge.  Cardiovascular:     Pulses:          Radial pulses are 2+ on the right side and 2+ on the left side.       Dorsalis pedis pulses are 2+ on the right side and 2+ on the left side.       Posterior tibial pulses are 2+ on the right side and 2+ on the left side.  Pulmonary:     Effort: Pulmonary effort is normal. No respiratory distress.  Abdominal:     General: Bowel sounds are normal. There is no distension.     Palpations: Abdomen is soft. There is no mass.     Tenderness: There is no abdominal tenderness. There is no guarding.     Comments: Abdomen soft,  nondistended, nontender to palpation in all quadrants without guarding or peritoneal signs, no CVA tenderness bilaterally  Musculoskeletal:     Cervical back: Neck supple.     Comments: Tenderness to palpation over left low back musculature.  Pain made worse with range of motion of the lower extremities  Skin:    General: Skin is warm and dry.     Capillary Refill: Capillary refill takes less than 2 seconds.  Neurological:     Mental Status: He is alert and oriented to person, place, and time.     Comments: Alert, clear speech, following commands. Moving all extremities without difficulty. Bilateral lower extremities with 5/5 strength in proximal and distal muscle groups and with dorsi and plantar flexion. Sensation intact in bilateral lower extremities. 2+ patellar DTRs bilaterally. Ambulatory with steady gait  Psychiatric:        Mood and Affect: Mood normal.        Behavior: Behavior normal.     ED Results / Procedures / Treatments   Labs (all labs ordered are listed, but only abnormal results are displayed) Labs Reviewed  URINALYSIS, ROUTINE W REFLEX MICROSCOPIC    EKG None  Radiology DG Lumbar Spine Complete  Result Date: 07/15/2019 CLINICAL DATA:  Low back pain LEFT greater than RIGHT for 4 days. EXAM: LUMBAR SPINE - COMPLETE 4+ VIEW COMPARISON:  10/25/2016 FINDINGS: Mild dextroconvex curvature of the lumbar spine. Vertebral body heights are maintained. Mild degenerative changes in the lower lumbar spine with similar appearance. Disc space narrowing at L3-4 and L4-5 as well as L5-S1. No malalignment. Incidental note is made of LEFT hip arthroplasty incompletely imaged. IMPRESSION: Similar appearance of mild degenerative changes in the lower lumbar spine. Electronically Signed   By: Zetta Bills M.D.   On: 07/15/2019 12:29    Procedures Procedures (including critical care time)  Medications Ordered in ED Medications  lidocaine (LIDODERM) 5 % 1 patch (1 patch  Transdermal Patch Applied 07/15/19 1148)  oxyCODONE-acetaminophen (PERCOCET/ROXICET) 5-325 MG per tablet 1 tablet (1 tablet Oral Given 07/15/19 1148)  methocarbamol (ROBAXIN) tablet 500 mg (500 mg Oral Given 07/15/19 1148)    ED Course  I have reviewed the triage vital signs and the nursing notes.  Pertinent labs & imaging results that were available during my care of the patient were reviewed by me and considered in my medical decision making (see chart for details).    MDM Rules/Calculators/A&P                          Patient presents with complaint of back pain.  Patient is nontoxic appearing, vitals are WNL. Patient has normal neurologic exam, no point/focal midline tenderness to palpation.  Tender primarily over the left low back musculature.  Ambulatory in the ED.  No back pain red flags. No urinary sxs. Most likely muscle strain versus spasm. Considered UTI/pyelonephritis, kidney stone, aortic aneurysm/dissection, cauda equina or epidural abscess however these do not feel these diagnoses fit clinical picture at this time.  UA without hematuria or signs of infection.  X-ray shows stable degenerative changes but no acute loss of disc height or signs of fracture.  Will treat with short course of Percocet, Tylenol and Robaxin, as well as Lidoderm patches.  Discussed with patient that they are not to drive or operate heavy machinery while taking Robaxin. I discussed treatment plan, need to follow-up with orthopedist which patient has seen previously for spinal injections due to herniated disc, and return precautions with the patient. Provided opportunity for questions, patient confirmed understanding and is in agreement with plan.   Final Clinical Impression(s) / ED Diagnoses Final diagnoses:  Acute left-sided low back pain without sciatica    Rx / DC Orders ED Discharge Orders         Ordered    oxyCODONE-acetaminophen (PERCOCET/ROXICET) 5-325 MG tablet  Every 8 hours PRN     Discontinue   Reprint     07/15/19 1257    methocarbamol (ROBAXIN) 500 MG tablet  2 times daily     Discontinue  Reprint     07/15/19 [place], [address], [place] 07/15/19 1257    Quintella Reichert, MD 07/15/19 [phone]

## 2019-07-25 ENCOUNTER — Other Ambulatory Visit: Payer: Self-pay

## 2019-07-25 ENCOUNTER — Ambulatory Visit (INDEPENDENT_AMBULATORY_CARE_PROVIDER_SITE_OTHER): Payer: 59 | Admitting: Physician Assistant

## 2019-07-25 ENCOUNTER — Encounter: Payer: Self-pay | Admitting: Physician Assistant

## 2019-07-25 ENCOUNTER — Telehealth: Payer: Self-pay | Admitting: Radiology

## 2019-07-25 DIAGNOSIS — M545 Low back pain, unspecified: Secondary | ICD-10-CM

## 2019-07-25 MED ORDER — METHYLPREDNISOLONE 4 MG PO TABS: ORAL_TABLET | ORAL | 0 refills | Status: DC

## 2019-07-25 NOTE — Telephone Encounter (Signed)
error 

## 2019-07-25 NOTE — Progress Notes (Signed)
Office Visit Note   Patient: [name] [name]           Date of Birth: 1955/05/28           MRN: [id number] Visit Date: 07/25/2019              Requested by: Tanda Rockers, MD [address],  [place] [address] PCP: Tanda Rockers, MD   Assessment & Plan: Visit Diagnoses:  1. Acute midline low back pain without sciatica     Plan: We will place him on a Medrol Dosepak.  He will continue his muscle relaxant and Tylenol.  He is unable to take NSAIDs.  Also is given a prescription for physical therapy for core strengthening, hamstring stretching, modalities and home exercise program.  These not trending towards improvement in 2 weeks may consider repeat epidural steroid injections versus new MRI if his symptoms change.  I did discuss with him that his symptoms are very similar to the symptoms he had back in 2018.  Questions were encouraged and answered at length.  Follow-Up Instructions: Return if symptoms worsen or fail to improve.   Orders:  No orders of the defined types were placed in this encounter.  Meds ordered this encounter  Medications  . methylPREDNISolone (MEDROL) 4 MG tablet    Sig: Take as directed    Dispense:  21 tablet    Refill:  0      Procedures: No procedures performed   Clinical Data: No additional findings.   Subjective: Chief Complaint  Patient presents with  . Lower Back - Pain    HPI Mr. Nayak is well-known to our department service comes in today with low back pain on the left.  The pain goes into his left buttocks but does not radiate down the leg.  Denies any numbness tingling down the leg.  States is painful for him lie down.  Pains been ongoing since early July.  He initially thought he had a kidney stone and this was ruled out.  Did go to [facility], ER on 07/15/2019 due to back pain radiographs were obtained.  Multiple views of the lumbar spine of been personally reviewed by me and shows loss of lordotic curvature.  No  acute fractures no spondylolisthesis.  Disc space narrowing L3-4 L4-5 and L5-S1. He denies any saddle anesthesia like symptoms, waking pain bowel bladder dysfunction.  Rates his pain to be 10 out of 10 pain at worst.  This was on 07/15/2019.  Over the last few days his pain at worst has been 5 out of 10.  He states overall it is somewhat better.  He has been taking a muscle relaxant and also some Tylenol and occasional oxycodone.  Has had no known injury. He was treated for very similar pain in the fall 2018 eventually underwent epidural steroid injections x2 in improved.  He has had on and off episodes that did not last long in regards to back pain.  MRI back in 2018 dated 11/02/2016 showed disc protrusion at L2-3 contacting and potentially irritating the exiting right L2 nerve root.  Central disc extrusion at L5-S1 which came in close proximity to the descending S1 nerve roots.  Right subarticular dose extrusion at L4-5 with secondary right L5 nerve root impingement.  Review of Systems See HPI otherwise negative or noncontributory.  Objective: Vital Signs: There were no vitals taken for this visit.  Physical Exam General: Well-developed well-nourished male in no acute distress.  Psych: Alert and oriented x3. Vascular dorsal pedal pulses are intact bilaterally calf supple nontender bilaterally. Ortho Exam Lower extremity strength testing 5 out of 5 strength throughout.  Negative straight leg raise on the right.  Positive straight leg raise on the left.  Tight hamstrings bilaterally.  Deep tendon reflexes are 2+ at knees and ankles and equal and symmetric.  Sensation grossly intact bilateral feet light touch. Specialty Comments:  No specialty comments available.  Imaging: No results found.   PMFS History: Patient Active Problem List   Diagnosis Date Noted  . Anxiety disorder 03/10/2018  . Acute low back pain without sciatica 10/31/2016  . Musculoskeletal neck pain 09/03/2016  .  Osteoarthritis of left hip 05/29/2015  . Status post total replacement of left hip 05/29/2015  . Hematuria, microscopic 05/25/2014  . Acute upper respiratory infections of unspecified site 01/18/2013  . Left carotid bruit 11/14/2012  . Hot flashes 11/04/2012  . Health care maintenance 04/25/2012  . Left groin pain 04/04/2012  . Hip pain, left 04/06/2011  . Hx of CABG 11/05/2010  . CAD (coronary artery disease)   . COLONIC POLYPS 05/26/2007  . OBESITY 05/26/2007  . Essential hypertension 05/26/2007  . Chronic ischemic heart disease 05/26/2007  . HEMORRHOIDS, INTERNAL 05/26/2007  . Allergic rhinitis 05/26/2007  . CONSTIPATION, CHRONIC 05/26/2007  . GASTROINTESTINAL HEMORRHAGE, HX OF 05/26/2007  . TOBACCO USE, QUIT 05/26/2007  . HYPERCHOLESTEROLEMIA 05/09/2007  . BLADDER NECK OBSTRUCTION 05/09/2007   Past Medical History:  Diagnosis Date  . Arthritis   . Blood clot in vein    superficial / right leg following CABG  . CAD (coronary artery disease)   . Chest pain   . Colon polyps   . Complication of anesthesia    hard to wake up  . Essential hypertension, benign   . Headache    migraines as teenager  . Heart murmur   . History of blood transfusion   . History of kidney stones   . Hyperlipemia   . Obesity   . Tobacco abuse     Family History  Problem Relation Age of Onset  . Hypertension Father   . Leukemia Mother   . Hypertension Mother   . Colon cancer Neg Hx     Past Surgical History:  Procedure Laterality Date  . CARDIAC CATHETERIZATION    . CIRCUMCISION  1986  . CORONARY ARTERY BYPASS GRAFT    . Coronary artery bypass grafting x4  10/21/2010  . CYSTOSCOPY WITH RETROGRADE PYELOGRAM, URETEROSCOPY AND STENT PLACEMENT Left 09/01/2014   Procedure: CYSTOSCOPY WITH RETROGRADE PYELOGRAM, URETEROSCOPY AND STENT PLACEMENT;  Surgeon: Irine Seal, MD;  Location: [facility];  Service: Urology;  Laterality: Left;  . CYSTOSCOPY WITH URETEROSCOPY AND STENT PLACEMENT Left 09/09/2014     Procedure: CYSTOSCOPY,LEFT URETEROSCOPY AND STENT PLACEMENT;  Surgeon: Irine Seal, MD;  Location: [facility];  Service: Urology;  Laterality: Left;  . HEMORRHOID SURGERY    . HOLMIUM LASER APPLICATION Left 9/89/2119   Procedure: WITH HOLMIUM LASER ;  Surgeon: Irine Seal, MD;  Location: [facility];  Service: Urology;  Laterality: Left;  . Stapled hemorrhoidopexy.  10/19/2006  . TOTAL HIP ARTHROPLASTY Left 05/29/2015   Procedure: LEFT TOTAL HIP ARTHROPLASTY ANTERIOR APPROACH;  Surgeon: Mcarthur Rossetti, MD;  Location: [facility];  Service: Orthopedics;  Laterality: Left;  Marland Kitchen VASECTOMY  1986   Social History   Occupational History  . Occupation: [employer]  Tobacco Use  . Smoking status: Former Smoker    Packs/day:  0.50    Years: 7.00    Pack years: 3.50    Types: Cigarettes    Quit date: 01/10/1981    Years since quitting: 38.5  . Smokeless tobacco: Never Used  Vaping Use  . Vaping Use: Never used  Substance and Sexual Activity  . Alcohol use: No    Alcohol/week: 0.0 standard drinks  . Drug use: No  . Sexual activity: Not on file

## 2019-09-14 ENCOUNTER — Emergency Department (HOSPITAL_COMMUNITY): Payer: 59

## 2019-09-14 ENCOUNTER — Encounter (HOSPITAL_COMMUNITY): Payer: Self-pay

## 2019-09-14 ENCOUNTER — Other Ambulatory Visit: Payer: Self-pay

## 2019-09-14 ENCOUNTER — Emergency Department (HOSPITAL_COMMUNITY)
Admission: EM | Admit: 2019-09-14 | Discharge: 2019-09-14 | Disposition: A | Discharge status: Home / Self Care | Payer: 59 | Attending: Emergency Medicine | Admitting: Emergency Medicine

## 2019-09-14 DIAGNOSIS — I2510 Atherosclerotic heart disease of native coronary artery without angina pectoris: Secondary | ICD-10-CM | POA: Insufficient documentation

## 2019-09-14 DIAGNOSIS — Z79899 Other long term (current) drug therapy: Secondary | ICD-10-CM | POA: Diagnosis not present

## 2019-09-14 DIAGNOSIS — Z96652 Presence of left artificial knee joint: Secondary | ICD-10-CM | POA: Insufficient documentation

## 2019-09-14 DIAGNOSIS — R42 Dizziness and giddiness: Secondary | ICD-10-CM | POA: Insufficient documentation

## 2019-09-14 DIAGNOSIS — I10 Essential (primary) hypertension: Secondary | ICD-10-CM | POA: Insufficient documentation

## 2019-09-14 DIAGNOSIS — Z7982 Long term (current) use of aspirin: Secondary | ICD-10-CM | POA: Diagnosis not present

## 2019-09-14 DIAGNOSIS — Z955 Presence of coronary angioplasty implant and graft: Secondary | ICD-10-CM | POA: Diagnosis not present

## 2019-09-14 DIAGNOSIS — H81399 Other peripheral vertigo, unspecified ear: Secondary | ICD-10-CM | POA: Diagnosis not present

## 2019-09-14 DIAGNOSIS — Z87891 Personal history of nicotine dependence: Secondary | ICD-10-CM | POA: Diagnosis not present

## 2019-09-14 LAB — CBC
HCT: 37 % — ABNORMAL LOW (ref 39.0–52.0)
Hemoglobin: 12 g/dL — ABNORMAL LOW (ref 13.0–17.0)
MCH: 30.3 pg (ref 26.0–34.0)
MCHC: 32.4 g/dL (ref 30.0–36.0)
MCV: 93.4 fL (ref 80.0–100.0)
Platelets: 224 10*3/uL (ref 150–400)
RBC: 3.96 MIL/uL — ABNORMAL LOW (ref 4.22–5.81)
RDW: 13.2 % (ref 11.5–15.5)
WBC: 6.1 10*3/uL (ref 4.0–10.5)
nRBC: 0 % (ref 0.0–0.2)

## 2019-09-14 LAB — COMPREHENSIVE METABOLIC PANEL
ALT: 32 U/L (ref 0–44)
AST: 28 U/L (ref 15–41)
Albumin: 4.5 g/dL (ref 3.5–5.0)
Alkaline Phosphatase: 40 U/L (ref 38–126)
Anion gap: 10 (ref 5–15)
BUN: 18 mg/dL (ref 8–23)
CO2: 26 mmol/L (ref 22–32)
Calcium: 9.4 mg/dL (ref 8.9–10.3)
Chloride: 102 mmol/L (ref 98–111)
Creatinine, Ser: 1.12 mg/dL (ref 0.61–1.24)
GFR calc Af Amer: >60 mL/min (ref >60)
GFR calc non Af Amer: >60 mL/min (ref >60)
Glucose, Bld: 168 mg/dL — ABNORMAL HIGH (ref 70–99)
Potassium: 3.6 mmol/L (ref 3.5–5.1)
Sodium: 138 mmol/L (ref 135–145)
Total Bilirubin: 0.9 mg/dL (ref 0.3–1.2)
Total Protein: 7.3 g/dL (ref 6.5–8.1)

## 2019-09-14 LAB — PROTIME-INR
INR: 1.1 (ref 0.8–1.2)
Prothrombin Time: 13.3 seconds (ref 11.4–15.2)

## 2019-09-14 LAB — CBG MONITORING, ED
Glucose-Capillary: 166 mg/dL — ABNORMAL HIGH (ref 70–99)
Glucose-Capillary: 107 mg/dL — ABNORMAL HIGH (ref 70–99)

## 2019-09-14 LAB — DIFFERENTIAL
Abs Immature Granulocytes: 0.02 10*3/uL (ref 0.00–0.07)
Basophils Absolute: 0 10*3/uL (ref 0.0–0.1)
Basophils Relative: 1 %
Eosinophils Absolute: 0.1 10*3/uL (ref 0.0–0.5)
Eosinophils Relative: 1 %
Immature Granulocytes: 0 %
Lymphocytes Relative: 26 %
Lymphs Abs: 1.6 10*3/uL (ref 0.7–4.0)
Monocytes Absolute: 0.4 10*3/uL (ref 0.1–1.0)
Monocytes Relative: 6 %
Neutro Abs: 4 10*3/uL (ref 1.7–7.7)
Neutrophils Relative %: 66 %

## 2019-09-14 LAB — I-STAT CHEM 8, ED
BUN: 20 mg/dL (ref 8–23)
Calcium, Ion: 1.21 mmol/L (ref 1.15–1.40)
Chloride: 102 mmol/L (ref 98–111)
Creatinine, Ser: 0.9 mg/dL (ref 0.61–1.24)
Glucose, Bld: 153 mg/dL — ABNORMAL HIGH (ref 70–99)
HCT: 38 % — ABNORMAL LOW (ref 39.0–52.0)
Hemoglobin: 12.9 g/dL — ABNORMAL LOW (ref 13.0–17.0)
Potassium: 3.6 mmol/L (ref 3.5–5.1)
Sodium: 139 mmol/L (ref 135–145)
TCO2: 25 mmol/L (ref 22–32)

## 2019-09-14 LAB — APTT: aPTT: 32 seconds (ref 24–36)

## 2019-09-14 MED ORDER — MECLIZINE HCL 25 MG PO TABS: 25.0000 mg | ORAL_TABLET | Freq: Three times a day (TID) | ORAL | 0 refills | Status: DC | PRN

## 2019-09-14 MED ORDER — SODIUM CHLORIDE 0.9 % IV BOLUS
500.0000 mL | Freq: Once | INTRAVENOUS | Status: AC
  Administered 2019-09-14: 500 mL via INTRAVENOUS

## 2019-09-14 MED ORDER — MECLIZINE HCL 25 MG PO TABS
25.0000 mg | ORAL_TABLET | Freq: Once | ORAL | Status: AC
  Administered 2019-09-14: 25 mg via ORAL
  Filled 2019-09-14: qty 1

## 2019-09-14 MED ORDER — SODIUM CHLORIDE 0.9% FLUSH: 3.0000 mL | Freq: Once | INTRAVENOUS | Status: DC

## 2019-09-14 MED ORDER — IOHEXOL 350 MG/ML SOLN
50.0000 mL | Freq: Once | INTRAVENOUS | Status: AC | PRN
  Administered 2019-09-14: 50 mL via INTRAVENOUS

## 2019-09-14 MED ORDER — LORAZEPAM 2 MG/ML IJ SOLN
1.0000 mg | Freq: Once | INTRAMUSCULAR | Status: AC
  Administered 2019-09-14: 1 mg via INTRAVENOUS
  Filled 2019-09-14: qty 1

## 2019-09-14 NOTE — ED Notes (Signed)
Pt ambulated with steady gait. Denis dizziness at this time. Pt states he is feeling better.

## 2019-09-14 NOTE — ED Provider Notes (Signed)
[facility] EMERGENCY DEPARTMENT Provider Note   CSN: [id number] Arrival date & time: 09/14/19  0957     History Chief Complaint  Patient presents with  . Code Stroke    [name] [name] is a 64 y.o. male.  Patient is a 64 year old male with a history of coronary artery disease, hypertension and hyperlipidemia who presents with sudden onset of dizziness.  He was brought in by [facility].  I was called to triage to see the patient.  He states that sometime between 830 and 9 he had sudden onset of dizziness while he was walking.  He describes an intense spinning sensation associated with nausea.  He is unable to walk due to difficulty with his balance.  He denies a headache.  He denies any vision changes.  No numbness or weakness to his extremities.  No history of similar symptoms in the past.        Past Medical History:  Diagnosis Date  . Arthritis   . Blood clot in vein    superficial / right leg following CABG  . CAD (coronary artery disease)   . Chest pain   . Colon polyps   . Complication of anesthesia    hard to wake up  . Essential hypertension, benign   . Headache    migraines as teenager  . Heart murmur   . History of blood transfusion   . History of kidney stones   . Hyperlipemia   . Obesity   . Tobacco abuse     Patient Active Problem List   Diagnosis Date Noted  . Anxiety disorder 03/10/2018  . Acute low back pain without sciatica 10/31/2016  . Musculoskeletal neck pain 09/03/2016  . Osteoarthritis of left hip 05/29/2015  . Status post total replacement of left hip 05/29/2015  . Hematuria, microscopic 05/25/2014  . Acute upper respiratory infections of unspecified site 01/18/2013  . Left carotid bruit 11/14/2012  . Hot flashes 11/04/2012  . Health care maintenance 04/25/2012  . Left groin pain 04/04/2012  . Hip pain, left 04/06/2011  . Hx of CABG 11/05/2010  . CAD (coronary artery disease)   . COLONIC POLYPS 05/26/2007    . OBESITY 05/26/2007  . Essential hypertension 05/26/2007  . Chronic ischemic heart disease 05/26/2007  . HEMORRHOIDS, INTERNAL 05/26/2007  . Allergic rhinitis 05/26/2007  . CONSTIPATION, CHRONIC 05/26/2007  . GASTROINTESTINAL HEMORRHAGE, HX OF 05/26/2007  . TOBACCO USE, QUIT 05/26/2007  . HYPERCHOLESTEROLEMIA 05/09/2007  . BLADDER NECK OBSTRUCTION 05/09/2007    Past Surgical History:  Procedure Laterality Date  . CARDIAC CATHETERIZATION    . CIRCUMCISION  1986  . CORONARY ARTERY BYPASS GRAFT    . Coronary artery bypass grafting x4  10/21/2010  . CYSTOSCOPY WITH RETROGRADE PYELOGRAM, URETEROSCOPY AND STENT PLACEMENT Left 09/01/2014   Procedure: CYSTOSCOPY WITH RETROGRADE PYELOGRAM, URETEROSCOPY AND STENT PLACEMENT;  Surgeon: Irine Seal, MD;  Location: [facility];  Service: Urology;  Laterality: Left;  . CYSTOSCOPY WITH URETEROSCOPY AND STENT PLACEMENT Left 09/09/2014   Procedure: CYSTOSCOPY,LEFT URETEROSCOPY AND STENT PLACEMENT;  Surgeon: Irine Seal, MD;  Location: [facility];  Service: Urology;  Laterality: Left;  . HEMORRHOID SURGERY    . HOLMIUM LASER APPLICATION Left 2/54/2706   Procedure: WITH HOLMIUM LASER ;  Surgeon: Irine Seal, MD;  Location: [facility];  Service: Urology;  Laterality: Left;  . Stapled hemorrhoidopexy.  10/19/2006  . TOTAL HIP ARTHROPLASTY Left 05/29/2015   Procedure: LEFT TOTAL HIP ARTHROPLASTY ANTERIOR APPROACH;  Surgeon: Harrell Gave  Kerry Fort, MD;  Location: [facility];  Service: Orthopedics;  Laterality: Left;  Marland Kitchen VASECTOMY  1986       Family History  Problem Relation Age of Onset  . Hypertension Father   . Leukemia Mother   . Hypertension Mother   . Colon cancer Neg Hx     Social History   Tobacco Use  . Smoking status: Former Smoker    Packs/day: 0.50    Years: 7.00    Pack years: 3.50    Types: Cigarettes    Quit date: 01/10/1981    Years since quitting: 38.7  . Smokeless tobacco: Never Used  Vaping Use  . Vaping Use: Never used  Substance Use Topics   . Alcohol use: No    Alcohol/week: 0.0 standard drinks  . Drug use: No    Home Medications Prior to Admission medications   Medication Sig Start Date End Date Taking? Authorizing Provider  acetaminophen (TYLENOL) 650 MG CR tablet Take 650 mg by mouth every 8 (eight) hours as needed for pain.   Yes [provider]  aspirin EC 81 MG tablet Take 1 tablet (81 mg total) by mouth daily. 08/08/17  Yes Burtis Junes, NP  Camphor-Menthol-Methyl Sal (SALONPAS) 3.01-16-08 % PTCH Apply 1 patch topically daily as needed (back pain).   Yes [provider]  losartan-hydrochlorothiazide (HYZAAR) 100-25 MG tablet Take 1 tablet by mouth daily. 11/12/18  Yes Tanda Rockers, MD  Menthol, Topical Analgesic, (BENGAY EX) Apply 1 application topically daily as needed (back pain).   Yes [provider]  metoprolol tartrate (LOPRESSOR) 25 MG tablet TAKE 1 TABLET BY MOUTH TWICE A DAY 03/11/19  Yes Tanda Rockers, MD  nitroGLYCERIN (NITROSTAT) 0.4 MG SL tablet Place 1 tablet (0.4 mg total) under the tongue every 5 (five) minutes as needed for chest pain. 08/08/17  Yes Burtis Junes, NP  simvastatin (ZOCOR) 40 MG tablet TAKE 1 TABLET BY MOUTH EVERYDAY AT BEDTIME Patient taking differently: Take 40 mg by mouth at bedtime. TAKE 1 TABLET BY MOUTH EVERYDAY AT BEDTIME 09/06/18  Yes Tanda Rockers, MD  meclizine (ANTIVERT) 25 MG tablet Take 1 tablet (25 mg total) by mouth 3 (three) times daily as needed for dizziness. 09/14/19   Malvin Johns, MD  methocarbamol (ROBAXIN) 500 MG tablet Take 1 tablet (500 mg total) by mouth 2 (two) times daily. Patient not taking: Reported on 09/14/2019 07/15/19   Jacqlyn Larsen, PA-C  methylPREDNISolone (MEDROL) 4 MG tablet Take as directed Patient not taking: Reported on 09/14/2019 07/25/19   Pete Pelt, PA-C  ondansetron (ZOFRAN ODT) 8 MG disintegrating tablet Take 1 tablet (8 mg total) by mouth every 8 (eight) hours as needed for nausea or vomiting. Patient not  taking: Reported on 09/14/2019 09/01/14   Charlann Lange, PA-C  oxyCODONE-acetaminophen (PERCOCET/ROXICET) 5-325 MG tablet Take 1 tablet by mouth every 8 (eight) hours as needed for severe pain. Patient not taking: Reported on 09/14/2019 07/15/19   Jacqlyn Larsen, PA-C    Allergies    Codeine  Review of Systems   Review of Systems  Constitutional: Negative for chills, diaphoresis, fatigue and fever.  HENT: Negative for congestion, rhinorrhea and sneezing.   Eyes: Negative.   Respiratory: Negative for cough, chest tightness and shortness of breath.   Cardiovascular: Negative for chest pain and leg swelling.  Gastrointestinal: Negative for abdominal pain, blood in stool, diarrhea, nausea and vomiting.  Genitourinary: Negative for difficulty urinating, flank pain, frequency and hematuria.  Musculoskeletal: Negative for arthralgias and back pain.  Skin: Negative for rash.  Neurological: Positive for dizziness. Negative for speech difficulty, weakness, numbness and headaches.    Physical Exam Updated Vital Signs BP 131/84   Pulse 67   Temp 97.8 F (36.6 C) (Oral)   Resp 15   Ht 5\' 8"  (1.727 m)   Wt 91.6 kg   SpO2 96%   BMI 30.71 kg/m   Physical Exam Constitutional:      Appearance: He is well-developed.  HENT:     Head: Normocephalic and atraumatic.  Eyes:     Extraocular Movements: Extraocular movements intact.     Pupils: Pupils are equal, round, and reactive to light.     Comments: Positive horizontal nystagmus, no vertical or rotational nystagmus  Cardiovascular:     Rate and Rhythm: Normal rate and regular rhythm.     Heart sounds: Normal heart sounds.  Pulmonary:     Effort: Pulmonary effort is normal. No respiratory distress.     Breath sounds: Normal breath sounds. No wheezing or rales.  Chest:     Chest wall: No tenderness.  Abdominal:     General: Bowel sounds are normal.     Palpations: Abdomen is soft.     Tenderness: There is no abdominal tenderness. There is  no guarding or rebound.  Musculoskeletal:        General: Normal range of motion.     Cervical back: Normal range of motion and neck supple.  Lymphadenopathy:     Cervical: No cervical adenopathy.  Skin:    General: Skin is warm and dry.     Findings: No rash.  Neurological:     Mental Status: He is alert and oriented to person, place, and time.     Comments: There is some ptosis of the right eyelid.  No other cranial nerve deficits are noted, visual fields are full to confrontation, motor 5 out of 5 all extremities, sensation grossly intact to light touch all extremities     ED Results / Procedures / Treatments   Labs (all labs ordered are listed, but only abnormal results are displayed) Labs Reviewed  CBC - Abnormal; Notable for the following components:      Result Value   RBC 3.96 (*)    Hemoglobin 12.0 (*)    HCT 37.0 (*)    All other components within normal limits  COMPREHENSIVE METABOLIC PANEL - Abnormal; Notable for the following components:   Glucose, Bld 168 (*)    All other components within normal limits  CBG MONITORING, ED - Abnormal; Notable for the following components:   Glucose-Capillary 166 (*)    All other components within normal limits  CBG MONITORING, ED - Abnormal; Notable for the following components:   Glucose-Capillary 107 (*)    All other components within normal limits  I-STAT CHEM 8, ED - Abnormal; Notable for the following components:   Glucose, Bld 153 (*)    Hemoglobin 12.9 (*)    HCT 38.0 (*)    All other components within normal limits  PROTIME-INR  APTT  DIFFERENTIAL    EKG EKG Interpretation  Date/Time:  Saturday September 14 2019 11:52:38 EDT Ventricular Rate:  54 PR Interval:  162 QRS Duration: 100 QT Interval:  437 QTC Calculation: 415 R Axis:   60 Text Interpretation: Sinus rhythm Probable left atrial enlargement RSR' in V1 or V2, right VCD or RVH Borderline T abnormalities, inferior leads since last tracing no significant  change  Confirmed by Malvin Johns [id number]) on 09/14/2019 12:55:54 PM   Radiology CT Angio Head W or Wo Contrast  Result Date: 09/14/2019 CLINICAL DATA:  Dizziness and nystagmus, rule out posterior circulation occlusion. EXAM: CT ANGIOGRAPHY HEAD AND NECK TECHNIQUE: Multidetector CT imaging of the head and neck was performed using the standard protocol during bolus administration of intravenous contrast. Multiplanar CT image reconstructions and MIPs were obtained to evaluate the vascular anatomy. Carotid stenosis measurements (when applicable) are obtained utilizing NASCET criteria, using the distal internal carotid diameter as the denominator. CONTRAST:  Uncertain on this and study progress. Please reference the EMR COMPARISON:  Preceding head CT. FINDINGS: CTA NECK FINDINGS Aortic arch: Atheromatous plaque. CABG with patent saphenous and LIMA grafts where large enough to visualize. Three vessel branching. Right carotid system: Mild atheromatous plaque without stenosis or ulceration. Left carotid system: Mild atheromatous plaque without stenosis or ulceration. Vertebral arteries: No proximal subclavian stenosis. Skeleton: Ordinary degenerative changes. Other neck: No significant finding. No mastoid or middle ear opacification. Upper chest: CABG with extensive scratched extensive coronary calcification with CABG. Dependent pulmonary opacity likely reflecting atelectasis. Review of the MIP images confirms the above findings CTA HEAD FINDINGS Anterior circulation: Heavily calcified carotid siphons with no stenosis over 50% based on coronal reformats. Hypoplastic left A1 segment and nearly azygos A2. No beading or saccular aneurysm. Posterior circulation: Mild left vertebral artery dominance. Calcified plaque at both V4 segments and at the proximal basilar. No flow limiting stenosis. Left PICA and bilateral AICA are visible. Symmetric superior cerebellar arteries. No PCA branch occlusion or proximal flow limiting  stenosis. Venous sinuses: Diffusely patent Anatomic variants: As above Review of the MIP images confirms the above findings IMPRESSION: 1. No emergent finding. 2. Atherosclerosis without flow limiting stenosis of major vessels in the head and neck. Electronically Signed   By: Monte Fantasia M.D.   On: 09/14/2019 10:57   CT Angio Neck W and/or Wo Contrast  Result Date: 09/14/2019 CLINICAL DATA:  Dizziness and nystagmus, rule out posterior circulation occlusion. EXAM: CT ANGIOGRAPHY HEAD AND NECK TECHNIQUE: Multidetector CT imaging of the head and neck was performed using the standard protocol during bolus administration of intravenous contrast. Multiplanar CT image reconstructions and MIPs were obtained to evaluate the vascular anatomy. Carotid stenosis measurements (when applicable) are obtained utilizing NASCET criteria, using the distal internal carotid diameter as the denominator. CONTRAST:  Uncertain on this and study progress. Please reference the EMR COMPARISON:  Preceding head CT. FINDINGS: CTA NECK FINDINGS Aortic arch: Atheromatous plaque. CABG with patent saphenous and LIMA grafts where large enough to visualize. Three vessel branching. Right carotid system: Mild atheromatous plaque without stenosis or ulceration. Left carotid system: Mild atheromatous plaque without stenosis or ulceration. Vertebral arteries: No proximal subclavian stenosis. Skeleton: Ordinary degenerative changes. Other neck: No significant finding. No mastoid or middle ear opacification. Upper chest: CABG with extensive scratched extensive coronary calcification with CABG. Dependent pulmonary opacity likely reflecting atelectasis. Review of the MIP images confirms the above findings CTA HEAD FINDINGS Anterior circulation: Heavily calcified carotid siphons with no stenosis over 50% based on coronal reformats. Hypoplastic left A1 segment and nearly azygos A2. No beading or saccular aneurysm. Posterior circulation: Mild left vertebral  artery dominance. Calcified plaque at both V4 segments and at the proximal basilar. No flow limiting stenosis. Left PICA and bilateral AICA are visible. Symmetric superior cerebellar arteries. No PCA branch occlusion or proximal flow limiting stenosis. Venous sinuses: Diffusely patent Anatomic variants: As above Review of the MIP  images confirms the above findings IMPRESSION: 1. No emergent finding. 2. Atherosclerosis without flow limiting stenosis of major vessels in the head and neck. Electronically Signed   By: Monte Fantasia M.D.   On: 09/14/2019 10:57   CT HEAD CODE STROKE WO CONTRAST`  Result Date: 09/14/2019 CLINICAL DATA:  Code stroke.  Dizziness and nystagmus. EXAM: CT HEAD WITHOUT CONTRAST TECHNIQUE: Contiguous axial images were obtained from the base of the skull through the vertex without intravenous contrast. COMPARISON:  None. FINDINGS: Brain: No evidence of acute infarction, hemorrhage, hydrocephalus, extra-axial collection or mass lesion/mass effect. Vascular: No focally hyperdense vessel. There is atherosclerotic calcification at the vertebral and basilar arteries. Skull: Normal. Negative for fracture or focal lesion. Sinuses/Orbits: No acute finding. Other: These results were communicated to Dr. Lorraine Lax at 10:40 amon 9/4/2021by text page via the [facility] messaging system. ASPECTS [facility] Stroke Program Early CT Score) Not scored with these posterior symptoms IMPRESSION: 1. No acute finding. 2. Atherosclerotic calcifications in the vertebral and basilar arteries. Electronically Signed   By: Monte Fantasia M.D.   On: 09/14/2019 10:42    Procedures Procedures (including critical care time)  Medications Ordered in ED Medications  sodium chloride flush (NS) 0.9 % injection 3 mL (has no administration in time range)  iohexol (OMNIPAQUE) 350 MG/ML injection 50 mL (50 mLs Intravenous Contrast Given 09/14/19 1100)  meclizine (ANTIVERT) tablet 25 mg (25 mg Oral Given 09/14/19 1154)  LORazepam  (ATIVAN) injection 1 mg (1 mg Intravenous Given 09/14/19 1153)  sodium chloride 0.9 % bolus 500 mL (0 mLs Intravenous Stopped 09/14/19 1403)    ED Course  I have reviewed the triage vital signs and the nursing notes.  Pertinent labs & imaging results that were available during my care of the patient were reviewed by me and considered in my medical decision making (see chart for details).    MDM Rules/Calculators/A&P                          Patient is a 64 year old male who presents with sudden onset of dizziness.  He also had some slight ptosis of his right eye.  Given these findings, code stroke was activated.  He had a CT scan of his head as well as CTA of his head and neck.  There is no acute abnormalities.  He had an MRI which showed no evidence of acute infarct.  His labs are reviewed and are nonconcerning.  His glucose is slightly elevated.  He was given treatment in the ED including IV fluids, meclizine and Ativan.  He is symptomatically better.  He is able to ambulate without ataxia or significant dizziness.  A central etiology for the vertigo was ruled out.  This is likely benign positional vertigo.  He was discharged home in good condition.  He has an appointment to follow-up with his PCP next week.  He was given a prescription for meclizine to help with the symptoms.  If he is not better, they can consider vestibular therapy.  Return precautions were given. Final Clinical Impression(s) / ED Diagnoses Final diagnoses:  Vertigo    Rx / DC Orders ED Discharge Orders         Ordered    meclizine (ANTIVERT) 25 MG tablet  3 times daily PRN        09/14/19 1437           Malvin Johns, MD 09/14/19 1441

## 2019-09-14 NOTE — Consult Note (Signed)
Neurology Consultation  Reason for Consult: Dizziness, headache Referring Physician: Dr. Malvin Johns CC: Dizziness, headache  History is obtained from: Patient, chart  HPI: [name] [name] is a 64 y.o. male past medical history of coronary artery disease status post CABG, hypertension, tobacco use, hyperlipidemia, presented to the emergency room for sudden onset of dizziness. He reports waking up at 530 this morning in his usual state of health.  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  He was sitting at work when he suddenly had a sensation of dizziness which he describes as room spinning as well as feeling off balance.  Last known well was somewhere around 830 or 9 AM.  He called EMS who evaluated him and brought him to the ER in triage.  EDMD examined him.  He had a nystagmus.  Given the sudden onset of symptoms along with nystagmus, code stroke was activated for concern for posterior circulation stroke. He reports the dizziness getting worse with any kind of movement.  He prefers sitting with his head tilted to the right and complained of worsening dizziness with every single motion that was done to move him from the CT table to the gurney etc. He was evaluated in the emergency room. Denies any tingling numbness weakness.  Denies any facial droop. Denies prior history of strokes. No chest pain shortness of breath.  Denies sick contacts.   LKW: 9 AM today tpa given?: no, exam more consistent with peripheral etiology nystagmus Premorbid modified Rankin scale (mRS): 0 ROS: Obtained and negative except as noted in HPI  Past Medical History:  Diagnosis Date  . Arthritis   . Blood clot in vein    superficial / right leg following CABG  . CAD (coronary artery disease)   . Chest pain   . Colon polyps   . Complication of anesthesia    hard to wake up  . Essential hypertension, benign   . Headache    migraines as teenager  . Heart murmur   . History of  blood transfusion   . History of kidney stones   . Hyperlipemia   . Obesity   . Tobacco abuse     Family History  Problem Relation Age of Onset  . Hypertension Father   . Leukemia Mother   . Hypertension Mother   . Colon cancer Neg Hx    Social History:   reports that he quit smoking about 38 years ago. His smoking use included cigarettes. He has a 3.50 pack-year smoking history. He has never used smokeless tobacco. He reports that he does not drink alcohol and does not use drugs.  Medications  Current Facility-Administered Medications:  .  LORazepam (ATIVAN) injection 1 mg, 1 mg, Intravenous, Once, Malvin Johns, MD .  meclizine (ANTIVERT) tablet 25 mg, 25 mg, Oral, Once, Belfi, Melanie, MD .  sodium chloride 0.9 % bolus 500 mL, 500 mL, Intravenous, Once, Belfi, Melanie, MD .  sodium chloride flush (NS) 0.9 % injection 3 mL, 3 mL, Intravenous, Once, Malvin Johns, MD  Current Outpatient Medications:  .  acetaminophen (TYLENOL) 650 MG CR tablet, Take 650 mg by mouth every 8 (eight) hours as needed for pain., Disp: , Rfl:  .  aspirin EC 81 MG tablet, Take 1 tablet (81 mg total) by mouth daily., Disp: , Rfl:  .  losartan-hydrochlorothiazide (HYZAAR) 100-25 MG tablet, Take 1 tablet by mouth daily., Disp: 90 tablet, Rfl: 3 .  methocarbamol (ROBAXIN) 500 MG tablet, Take  1 tablet (500 mg total) by mouth 2 (two) times daily., Disp: 20 tablet, Rfl: 0 .  methylPREDNISolone (MEDROL) 4 MG tablet, Take as directed, Disp: 21 tablet, Rfl: 0 .  metoprolol tartrate (LOPRESSOR) 25 MG tablet, TAKE 1 TABLET BY MOUTH TWICE A DAY, Disp: 180 tablet, Rfl: 3 .  nitroGLYCERIN (NITROSTAT) 0.4 MG SL tablet, Place 1 tablet (0.4 mg total) under the tongue every 5 (five) minutes as needed for chest pain., Disp: 25 tablet, Rfl: 3 .  ondansetron (ZOFRAN ODT) 8 MG disintegrating tablet, Take 1 tablet (8 mg total) by mouth every 8 (eight) hours as needed for nausea or vomiting., Disp: 20 tablet, Rfl: 0 .   oxyCODONE-acetaminophen (PERCOCET/ROXICET) 5-325 MG tablet, Take 1 tablet by mouth every 8 (eight) hours as needed for severe pain., Disp: 8 tablet, Rfl: 0 .  simvastatin (ZOCOR) 40 MG tablet, TAKE 1 TABLET BY MOUTH EVERYDAY AT BEDTIME, Disp: 30 tablet, Rfl: 11  Exam: Current vital signs: BP (!) 142/80 (BP Location: Right Arm)   Pulse (!) 53   Temp 97.8 F (36.6 C) (Oral)   Resp 14   Wt 91.6 kg Comment: pt reported  SpO2 97%   BMI 31.17 kg/m  Vital signs in last 24 hours: Temp:  [97.8 F (36.6 C)] 97.8 F (36.6 C) (09/04 1011) Pulse Rate:  [53] 53 (09/04 1011) Resp:  [14] 14 (09/04 1011) BP: (142)/(80) 142/80 (09/04 1011) SpO2:  [97 %] 97 % (09/04 1011) Weight:  [91.6 kg] 91.6 kg (09/04 1016) GENERAL: Awake, alert in NAD HEENT: - Normocephalic and atraumatic, dry mm, no LN++, no Thyromegally LUNGS - Clear to auscultation bilaterally with no wheezes CV - S1S2 RRR, no m/r/g, equal pulses bilaterally. ABDOMEN - Soft, nontender, nondistended with normoactive BS Ext: warm, well perfused, intact peripheral pulses, no edema NEURO:  Mental Status: AA&Ox3  Language: speech is clear.  Naming, repetition, fluency, and comprehension intact. Cranial Nerves: PERRL extraocular movement examination shows nystagmus on leftward gaze with a fast component to the right, looking to the right, there was no nystagmus-no direction changing., visual fields full, mild right ptosis, no facial asymmetry, facial sensation intact, hearing intact, tongue/uvula/soft palate midline, normal sternocleidomastoid and trapezius muscle strength. No evidence of tongue atrophy or fibrillations Motor: 5/5 with no drift in all fours Tone: is normal and bulk is normal Sensation- Intact to light touch bilaterally Coordination: FTN intact bilaterally Gait- deferred  NIHSS-0  Labs I have reviewed labs in epic and the results pertinent to this consultation are:  CBC    Component Value Date/Time   WBC 6.1 09/14/2019  1009   RBC 3.96 (L) 09/14/2019 1009   HGB 12.9 (L) 09/14/2019 1033   HGB 12.5 (L) 08/08/2017 0901   HCT 38.0 (L) 09/14/2019 1033   HCT 36.6 (L) 08/08/2017 0901   PLT 224 09/14/2019 1009   PLT 246 08/08/2017 0901   MCV 93.4 09/14/2019 1009   MCV 90 08/08/2017 0901   MCH 30.3 09/14/2019 1009   MCHC 32.4 09/14/2019 1009   RDW 13.2 09/14/2019 1009   RDW 13.6 08/08/2017 0901   LYMPHSABS 1.6 09/14/2019 1009   MONOABS 0.4 09/14/2019 1009   EOSABS 0.1 09/14/2019 1009   BASOSABS 0.0 09/14/2019 1009    CMP     Component Value Date/Time   NA 139 09/14/2019 1033   NA 140 08/08/2017 0901   K 3.6 09/14/2019 1033   CL 102 09/14/2019 1033   CO2 26 09/14/2019 1009   GLUCOSE 153 (H) 09/14/2019  1033   BUN 20 09/14/2019 1033   BUN 21 08/08/2017 0901   CREATININE 0.90 09/14/2019 1033   CALCIUM 9.4 09/14/2019 1009   PROT 7.3 09/14/2019 1009   PROT 6.7 08/08/2017 0901   ALBUMIN 4.5 09/14/2019 1009   ALBUMIN 4.7 08/08/2017 0901   AST 28 09/14/2019 1009   ALT 32 09/14/2019 1009   ALKPHOS 40 09/14/2019 1009   BILITOT 0.9 09/14/2019 1009   BILITOT 0.3 08/08/2017 0901   GFRNONAA >60 09/14/2019 1009   GFRAA >60 09/14/2019 1009   Imaging I have reviewed the images obtained:  CT-scan of the brain-no acute changes.  No bleed. CTA head and neck with no emergent LVO.  Atherosclerosis without flow-limiting stenosis of the major vessels of the head and neck.  Left vertebral artery dominant and posterior circulation with calcified plaque at both V4 segments and at proximal basilar.  No flow-limiting stenosis.  Left PICA and bilateral AICA's are visible.  Symmetric superior cerebellar arteries.  No PCA branch occlusion or stenosis. MRI examination of the brain-preliminary review negative for acute stroke.  Assessment:  64 year old man with past history of coronary artery disease, status post CABG, hypertension tobacco use and hyperlipidemia presented for sudden onset of dizziness.  Examination  consistent with dizziness on leftward gaze and no nystagmus on rightward gaze. Unidirectional gaze concerning for a peripheral vertiginous etiology. Given risk factors, did do CTA head and neck and a stat MRI of the brain both of which are negative for any acute process. Most likely peripheral vertigo.  Impression: Evaluate for peripheral vertigo Less likely to be stroke with a negative MRI and clinical findings of unidirectional nystagmus along with severe worsening of symptoms with change in position.  Recommendations: Meclizine as needed IV fluids Can use diazepam as needed Vestibular rehab as needed Can consider outpatient neurology follow-up if symptoms recur. Avoid sudden head movements or position changes until symptoms resolve or subside.  Plan discussed with Dr. Tamera Punt.  -- Amie Portland, MD [facility] Pager: [phone] If 7pm to 7am, please call on call as listed on AMION.

## 2019-09-14 NOTE — ED Notes (Signed)
Administer 1mg  of ativan. Wasted 1mg  with Gregor Hams after pt was d/c. Jonnie Kind, Pharmacist.

## 2019-09-14 NOTE — ED Triage Notes (Signed)
Patient arrived by [facility] for acute dizziness that started whilw at work this am. Patient reports that he ha nausea and several episodes of diaphoresis with same. Slight droop noted of right eye and eye brow. denies cold, denies cough, no hx of vertigo. MD called for assessment at triage

## 2019-09-14 NOTE — ED Notes (Signed)
ACTIVATED CODE STROKE 

## 2019-09-14 NOTE — ED Notes (Signed)
Pt d/c home per MD order. Discharge summary reviewed, pt verbalizes understanding. No s/s of acute distress noted. Off unit via WC. Pt wife discharge ride home.

## 2019-09-17 ENCOUNTER — Encounter: Payer: BC Managed Care – PPO | Admitting: Internal Medicine

## 2019-09-23 ENCOUNTER — Other Ambulatory Visit: Payer: Self-pay

## 2019-09-23 ENCOUNTER — Encounter: Payer: Self-pay | Admitting: Internal Medicine

## 2019-09-23 ENCOUNTER — Ambulatory Visit (INDEPENDENT_AMBULATORY_CARE_PROVIDER_SITE_OTHER): Payer: 59 | Admitting: Internal Medicine

## 2019-09-23 ENCOUNTER — Other Ambulatory Visit (INDEPENDENT_AMBULATORY_CARE_PROVIDER_SITE_OTHER): Payer: 59

## 2019-09-23 ENCOUNTER — Ambulatory Visit (INDEPENDENT_AMBULATORY_CARE_PROVIDER_SITE_OTHER): Payer: 59

## 2019-09-23 VITALS — BP 130/86 | HR 68 | Temp 98.3°F | Ht 68.0 in | Wt 205.0 lb

## 2019-09-23 DIAGNOSIS — E7800 Pure hypercholesterolemia, unspecified: Secondary | ICD-10-CM

## 2019-09-23 DIAGNOSIS — J309 Allergic rhinitis, unspecified: Secondary | ICD-10-CM

## 2019-09-23 DIAGNOSIS — I10 Essential (primary) hypertension: Secondary | ICD-10-CM

## 2019-09-23 DIAGNOSIS — N320 Bladder-neck obstruction: Secondary | ICD-10-CM

## 2019-09-23 DIAGNOSIS — Z23 Encounter for immunization: Secondary | ICD-10-CM | POA: Diagnosis not present

## 2019-09-23 DIAGNOSIS — B356 Tinea cruris: Secondary | ICD-10-CM | POA: Insufficient documentation

## 2019-09-23 LAB — LIPID PANEL
Cholesterol: 148 mg/dL (ref 0–200)
HDL: 40 mg/dL
LDL Cholesterol: 86 mg/dL (ref 0–99)
NonHDL: 108.21
Total CHOL/HDL Ratio: 4
Triglycerides: 112 mg/dL (ref 0.0–149.0)
VLDL: 22.4 mg/dL (ref 0.0–40.0)

## 2019-09-23 LAB — PSA: PSA: 0.95 ng/mL (ref 0.10–4.00)

## 2019-09-23 LAB — TSH: TSH: 1.7 u[IU]/mL (ref 0.35–4.50)

## 2019-09-23 MED ORDER — CLOTRIMAZOLE 1 % EX CREA: 1.0000 "application " | TOPICAL_CREAM | Freq: Two times a day (BID) | CUTANEOUS | 11 refills | Status: DC

## 2019-09-23 MED ORDER — SIMVASTATIN 40 MG PO TABS
40.0000 mg | ORAL_TABLET | Freq: Every day | ORAL | 3 refills | Status: DC
Start: 2019-09-23 — End: 2020-06-03

## 2019-09-23 NOTE — Patient Instructions (Addendum)
Please remember to go to the lab and x-ray department   for your tests - we will call you with the results when they are available.  Lotrimin (clotrimazole) twice daily to groin and keep as dry as possible  - take x 2 weeks repeat if needed     Please schedule a follow up visit in 6  months but call sooner if needed

## 2019-09-23 NOTE — Progress Notes (Signed)
Patient ID: [name] [name], male   DOB: 08-05-1955   MRN: [id number]   Brief patient profile:  60   yobm quit smoking in early 1980's with hypertension hyperlipidemia and atypical chest pain with a left heart catheter showing 60% obstruction of the right-sided branches in the 1999 and a negative stress study in May of 2007> cabg required 10/2010    History of Present Illness  08/12/2010 ov/[name] [name]  Cc new onset cp comes and goes x 1 week so went to ER July 30th with neg w/u and much less cp since er.  Location = Left of sternum only, aching but   No relation to activity, lasts no more than a few seconds, not noticeable supine or sleeping.  Does ok with exercise with no pains. rec Cardiac eval    DATE OF ADMISSION: 10/18/2010  DATE OF DISCHARGE: 10/24/2010  DISCHARGE SUMMARY  PRIMARY ADMITTING DIAGNOSIS: Chest pain.  ADDITIONAL/DISCHARGE DIAGNOSES:  1. Coronary artery disease.  2. New-onset angina.  3. Hypertension.  4. Hyperlipidemia.  5. Remote history of tobacco abuse.  6. Postoperative acute blood loss anemia.  PROCEDURES PERFORMED:  1. Cardiac catheterization.  2. Coronary artery bypass grafting x4 (left internal mammary artery to  the LAD, saphenous vein graft to the second diagonal, saphenous  vein graft to the first obtuse marginal, saphenous vein graft to  the posterior descending).  3. Endoscopic vein harvest, right leg  04/04/2011 f/u ov/[name] [name] cc new L lat localized hip pain worse standing up, 3-4 weeks comes and goes, not tried nsaids, maybe worse toward end of day rec Try advil up to 3 with meals to see if helps hip pain and if not better within a week call for referral to an orthopedic surgeon> referred     09/02/2016 acute extended ov/[name] [name] re: neck pain  Chief Complaint  Patient presents with  . Acute Visit    Pt c/o feeling anxious recently. He states he has also noticed some dizziness when he bends over.  He states he has also noticed some soreness in the left side  of his neck for the past.   new onset  10 days positional  L neck shoots into L post shoulder  Tylenol helps/ heat helps / no pain with motion of L shoulder/ no numbness or weakness of arm or hand on L  Overdue for cpx  Not getting any ex at all but with ex denies sob or cp  rec Neck stretching x 6 motions, hold each one x 30 secs and repeat as frequently as you can but at least sev times a day especially at bedtime and on rising in am  Heat/ advil 200 up 3 with meals as needed (9 max per 24h)     10/24/16  Back injury at home leaning over to get something out of car > midline low back pain>  Ortho > shots > no more need for flex/advil     04/05/2017 acute  ov/[name] [name] re: new back pain in pt with hbp Chief Complaint  Patient presents with  . Acute Visit    shrap pain in left side for 1 week now   new left sided back pain acute onset x  two weeks prior to OV  When bending over to reach for luggage  Positional /better on lying down or sitting still  Started advil 3 x day some better No radicular features/ rash/ pain with cough or change with urination /bms Other back pain better p injection of pain  at ortho office  rec Tdap today  flexoril 10 mg up to 4 x daily as needed for muscle spasm Advil 3-4 with meals as needed for pain (= up to 12 per day)  Heating pad may help Call your orthopedic doctor for follow up     08/10/17  Neg Myoview     09/05/2018  f/u ov/[name] [name] re:  cpx Chief Complaint  Patient presents with  . Annual Exam    not fasting. Doing well and denies any co's.   Dyspnea:  Better with exertion , tol steps better, losing wt intentionally Cough: none  Sleeping: fine flat in bed  SABA use: non 02: none Back pain s radicular features, worse at bedtime  rec If blood pressure running on low side then break the telmisartan-hctz in half       11/12/2018  f/u ov/[name] [name] re: hbp / can't afford micardis 80-25  Chief Complaint  Patient presents with  . Follow-up    Discuss  formulary- copay on telmisartan has went up.   Dyspnea:  No regular ex , sometimes doe when gets in a big hurry but no assoc cp Cough: no Sleeping: able to lie down flat ok  SABA use: none rec Finish your telmasartan and start losartan-hctz one daily in its place and monitor your blood pressure daily     03/15/2019  f/u ov/[name] [name] re:  HBP/ has had both covid vaccines Moderna last one Feb 4th  2021  Chief Complaint  Patient presents with  . Follow-up    Essential Hypertension  Dyspnea:  No regular exercise  Cough: no Sleeping: able to lie flat SABA use: no 02: no rec increase aerobic ex  09/14/19 ER eval vertigo neg MRI / eval neuro > peripheral vertigo   09/23/2019  f/u ov/[name] [name] re: cpx  Re hbp /IHD / hyperlipidemia  Chief Complaint  Patient presents with  . Follow-up    reports episode of vertigo last week. states one other episode last friday where he took meclizine with relief. also requesting simvastatin refill   Dyspnea:  Not limited by breathing from desired activities   Cough: no Sleeping: no resp  SABA use: none  02: none    No obvious day to day or daytime variability or assoc excess/ purulent sputum or mucus plugs or hemoptysis or cp or chest tightness, subjective wheeze or overt sinus or hb symptoms.   Sleeping  without nocturnal  or early am exacerbation  of respiratory  c/o's or need for noct saba. Also denies any obvious fluctuation of symptoms with weather or environmental changes or other aggravating or alleviating factors except as outlined above   No unusual exposure hx or h/o childhood pna/ asthma or knowledge of premature birth.  Current Allergies, Complete Past Medical History, Past Surgical History, Family History, and Social History were reviewed in [employer] record.  ROS  The following are not active complaints unless bolded Hoarseness, sore throat, dysphagia, dental problems, itching, sneezing,  nasal congestion or discharge of  excess mucus or purulent secretions, ear ache,   fever, chills, sweats, unintended wt loss or wt gain, classically pleuritic or exertional cp,  orthopnea pnd or arm/hand swelling  or leg swelling, presyncope, palpitations, abdominal pain, anorexia, nausea, vomiting, diarrhea  or change in bowel habits or change in bladder habits, change in stools or change in urine, dysuria, hematuria,  rash, arthralgias, visual complaints, headache, numbness, weakness or ataxia or problems with walking or coordination,  change in mood or  memory.        Current Meds  Medication Sig  . acetaminophen (TYLENOL) 650 MG CR tablet Take 650 mg by mouth every 8 (eight) hours as needed for pain.  Marland Kitchen aspirin EC 81 MG tablet Take 1 tablet (81 mg total) by mouth daily.  . Camphor-Menthol-Methyl Sal (SALONPAS) 3.01-16-08 % PTCH Apply 1 patch topically daily as needed (back pain).  Marland Kitchen losartan-hydrochlorothiazide (HYZAAR) 100-25 MG tablet Take 1 tablet by mouth daily.  . meclizine (ANTIVERT) 25 MG tablet Take 1 tablet (25 mg total) by mouth 3 (three) times daily as needed for dizziness.  . Menthol, Topical Analgesic, (BENGAY EX) Apply 1 application topically daily as needed (back pain).  . methocarbamol (ROBAXIN) 500 MG tablet Take 1 tablet (500 mg total) by mouth 2 (two) times daily.  . methylPREDNISolone (MEDROL) 4 MG tablet Take as directed  . metoprolol tartrate (LOPRESSOR) 25 MG tablet TAKE 1 TABLET BY MOUTH TWICE A DAY  . nitroGLYCERIN (NITROSTAT) 0.4 MG SL tablet Place 1 tablet (0.4 mg total) under the tongue every 5 (five) minutes as needed for chest pain.  Marland Kitchen ondansetron (ZOFRAN ODT) 8 MG disintegrating tablet Take 1 tablet (8 mg total) by mouth every 8 (eight) hours as needed for nausea or vomiting.  Marland Kitchen oxyCODONE-acetaminophen (PERCOCET/ROXICET) 5-325 MG tablet Take 1 tablet by mouth every 8 (eight) hours as needed for severe pain.  . simvastatin (ZOCOR) 40 MG tablet Take 1 tablet (40 mg total) by mouth at bedtime. TAKE 1  TABLET BY MOUTH EVERYDAY AT BEDTIME  . [DISCONTINUED] simvastatin (ZOCOR) 40 MG tablet TAKE 1 TABLET BY MOUTH EVERYDAY AT BEDTIME (Patient taking differently: Take 40 mg by mouth at bedtime. TAKE 1 TABLET BY MOUTH EVERYDAY AT BEDTIME)                 Past Medical History from [facility]: S/p THR on L Blackman and Erskine Emery PA  HYPERTENSION, BENIGN ESSENTIAL, CONTROLLED (ICD-401.1)  OBESITY  - Target wt = 190 for BMI < 30  Colon polyps..............................................................................................  - colonoscopy 4/04, letter sent 05/15/07 reviewed with pt July 17, 2008 and August 03, 2009 > referred back to GI  CHEST PAIN (ICD-786.50) ....................................................................................  Nishan - s/p cabg 10/19/2010 HYPERLIPIDEMIA  - target LDL less than 70 based on documented ischemic heart disease  HEALTH MAINTENANCE..........................................................................................[name]  - TD 04/05/2017  - Prevnar 13 09/05/2018  - Covid 11 moderna completed Feb 14 2019  - CPX    09/25/2019      Family History:  hypertension in mother and father no premature heart disease in any direct relatives nor stroke diabetes or cancer, except that his mother has developed leukemia  1 brother healthy  Neg prostate ca     Social History:  quit smoking about 1983 denies alcohol use.  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           Objective:   Physical Exam      09/23/2019  209 03/15/2019     201 11/12/2018   201  09/05/2018  197  wt 201 November 20, 2007 > 205 April 18, 2008 > 184 01/03/2011 > 182 04/04/2011 > 07/04/2011  185  >189 09/19/2011 > 01/02/2012 194 >204 02/09/2012 >196 04/04/2012 > 04/25/2012 194 > 11/02/2012  193 > 01/19/2013  190 >  05/23/2014  193 > 05/25/2015 196 > 09/02/2016   200   > 04/05/2017   207 > 05/18/2017  99% RA> 09/04/2017 203>  03/08/2018   204  Vital signs reviewed  09/23/2019  - Note at rest  02 sats  99% on RA     HEENT : pt wearing mask not removed for exam due to covid -19 concerns.    NECK :  without JVD/Nodes/TM/ nl carotid upstrokes bilaterally   LUNGS: no acc muscle use,  Nl contour chest which is clear to A and P bilaterally without cough on insp or exp maneuvers   CV:  RRR  no s3 or murmur or increase in P2, and no edema   ABD:  soft and nontender with nl inspiratory excursion in the supine position. No bruits or organomegaly appreciated, bowel sounds nl  MS:  Nl gait/ ext warm without deformities, calf tenderness, cyanosis or clubbing No obvious joint restrictions   SKIN: warm and dry with classic mild tinea cruris   NEURO:  alert, approp, nl sensorium with  no motor or cerebellar deficits apparent.   GU  Uncirc/ testes down bilaterally/ no nodules/ IH  Rectal:  Mild bph smooth, no nodules/ stool G neg      Labs ordered/ reviewed:      Chemistry      Component Value Date/Time   NA 139 09/14/2019 1033   NA 140 08/08/2017 0901   K 3.6 09/14/2019 1033   CL 102 09/14/2019 1033   CO2 26 09/14/2019 1009   BUN 20 09/14/2019 1033   BUN 21 08/08/2017 0901   CREATININE 0.90 09/14/2019 1033      Component Value Date/Time   CALCIUM 9.4 09/14/2019 1009   ALKPHOS 40 09/14/2019 1009   AST 28 09/14/2019 1009   ALT 32 09/14/2019 1009   BILITOT 0.9 09/14/2019 1009   BILITOT 0.3 08/08/2017 0901        Lab Results  Component Value Date   WBC 6.1 09/14/2019   HGB 12.9 (L) 09/14/2019   HCT 38.0 (L) 09/14/2019   MCV 93.4 09/14/2019   PLT 224 09/14/2019        Lab Results  Component Value Date   TSH 1.70 09/23/2019             ekg 09/17/19  Nsr/ wnl   Assessment:

## 2019-09-25 ENCOUNTER — Encounter: Payer: Self-pay | Admitting: Internal Medicine

## 2019-09-25 NOTE — Assessment & Plan Note (Signed)
Target LDL < 70 since has known CAD  Lab Results  Component Value Date   CHOL 148 09/23/2019   HDL 40.00 09/23/2019   LDLCALC 86 09/23/2019   TRIG 112.0 09/23/2019   CHOLHDL 4 09/23/2019     Lab Results  Component Value Date   ALT 32 09/14/2019   AST 28 09/14/2019   ALKPHOS 40 09/14/2019   BILITOT 0.9 09/14/2019     Adequate control on present rx, reviewed in detail with pt > no change in rx needed  > needs more exercise aerobically / reviewed

## 2019-09-25 NOTE — Assessment & Plan Note (Signed)
Lab Results  Component Value Date   CREATININE 0.90 09/14/2019   CREATININE 1.12 09/14/2019   CREATININE 1.04 11/12/2018     U/a nl   Adequate control on present rx, reviewed in detail with pt > no change in rx needed

## 2019-09-25 NOTE — Assessment & Plan Note (Signed)
Onset summer 2021   Rex lotrimin cream

## 2019-10-17 ENCOUNTER — Encounter: Payer: Self-pay | Admitting: Cardiovascular Disease

## 2019-10-17 ENCOUNTER — Other Ambulatory Visit: Payer: Self-pay

## 2019-10-17 ENCOUNTER — Ambulatory Visit (INDEPENDENT_AMBULATORY_CARE_PROVIDER_SITE_OTHER): Payer: 59 | Admitting: Cardiovascular Disease

## 2019-10-17 VITALS — BP 138/80 | HR 51 | Ht 67.0 in | Wt 205.4 lb

## 2019-10-17 DIAGNOSIS — Z951 Presence of aortocoronary bypass graft: Secondary | ICD-10-CM | POA: Diagnosis not present

## 2019-10-17 MED ORDER — NITROGLYCERIN 0.4 MG SL SUBL: 0.4000 mg | SUBLINGUAL_TABLET | SUBLINGUAL | 3 refills | Status: DC | PRN

## 2019-10-17 NOTE — Progress Notes (Signed)
Patient ID: [name] [name], male   DOB: [birth year], 64 y.o.   MRN: [id number]    64 y.o. seen for f/u CAD  S/P CABG 10/19/10 [name] [name] F/U CAD  SURGICAL PROCEDURE: Coronary artery bypass grafting x4 with the left  internal mammary to the left anterior descending coronary artery,  reverse saphenous vein graft to the second diagonal coronary artery,  reverse saphenous vein graft to the first obtuse marginal, reverse  saphenous vein graft to the posterior descending coronary artery with  right leg endovein harvesting.   Myovue 08/10/17 non ischemic   Duplex 02/27/14  Plaque no stenosis f/u again 03/8464    Had uncomplicated left THR with Dr Ninfa Linden on 05/30/15  Able to walk with his labs now   Older brother had to have CABG in 2018 as well   Has had some hematuria and saw Dr Jeffie Pollock in Urology Has not needed cystoscopy  Seen in ED for vertigo 09/14/19 CT/MRI/MRA negative Rx IV fluids , meclizine and ativan  Needs new nitro   ROS: Denies fever, malais, weight loss, blurry vision, decreased visual acuity, cough, sputum, SOB, hemoptysis, pleuritic pain, palpitaitons, heartburn, abdominal pain, melena, lower extremity edema, claudication, or rash.  All other systems reviewed and negative  General: Affect appropriate Healthy:  appears stated age 64: normal Neck supple with no adenopathy JVP normal left  bruits no thyromegaly Lungs clear with no wheezing and good diaphragmatic motion Heart:  S1/S2 no murmur, no rub, gallop or click PMI normal Abdomen: benighn, BS positve, no tenderness, no AAA no bruit.  No HSM or HJR Distal pulses intact with no bruits No edema Neuro non-focal Skin warm and dry No muscular weakness   Current Outpatient Medications  Medication Sig Dispense Refill  . acetaminophen (TYLENOL) 650 MG CR tablet Take 650 mg by mouth every 8 (eight) hours as needed for pain.    Marland Kitchen aspirin EC 81 MG tablet Take 1 tablet (81 mg total) by mouth daily.    .  Camphor-Menthol-Methyl Sal (SALONPAS) 3.01-16-08 % PTCH Apply 1 patch topically daily as needed (back pain).    . clotrimazole (LOTRIMIN) 1 % cream Apply 1 application topically 2 (two) times daily. 30 g 11  . losartan-hydrochlorothiazide (HYZAAR) 100-25 MG tablet Take 1 tablet by mouth daily. 90 tablet 3  . meclizine (ANTIVERT) 25 MG tablet Take 1 tablet (25 mg total) by mouth 3 (three) times daily as needed for dizziness. 30 tablet 0  . Menthol, Topical Analgesic, (BENGAY EX) Apply 1 application topically daily as needed (back pain).    . metoprolol tartrate (LOPRESSOR) 25 MG tablet TAKE 1 TABLET BY MOUTH TWICE A DAY 180 tablet 3  . nitroGLYCERIN (NITROSTAT) 0.4 MG SL tablet Place 1 tablet (0.4 mg total) under the tongue every 5 (five) minutes as needed for chest pain. 25 tablet 3  . simvastatin (ZOCOR) 40 MG tablet Take 1 tablet (40 mg total) by mouth at bedtime. TAKE 1 TABLET BY MOUTH EVERYDAY AT BEDTIME 90 tablet 3   No current facility-administered medications for this visit.    Allergies  Codeine  Electrocardiogram:  01/02/12  SR rate 56 nonspecific inferolateral T wave changes  11/14/12 SR rate 58 RSR' same nonspecific ST changes 02/10/14  SR rate 67 nonspecific ST/T wave changes   Assessment and Plan CAD:  Stable no angina CABG 2012 Myovue non ischemic 08/10/17  HTN:  Much improved with diuretic Hematuria has f/u appt with Dr Jeffie Pollock  PSA normal Chol:  Cholesterol is at goal.  Continue current dose of statin and diet Rx.  No myalgias or side effects.  F/U  LFT's in 6 months. Lab Results  Component Value Date   LDLCALC 86 09/23/2019   Ortho: post left THR anterior approach improved mobility  Vertigo:  F/u primary has meclizine at home consider vestibular PT/OT  F/U with me 6 months New nitro called in   [employer]

## 2019-10-17 NOTE — Patient Instructions (Signed)

## 2019-10-19 ENCOUNTER — Other Ambulatory Visit: Payer: Self-pay | Admitting: Internal Medicine

## 2019-12-11 ENCOUNTER — Ambulatory Visit: Payer: 59 | Admitting: Cardiovascular Disease

## 2020-01-23 ENCOUNTER — Other Ambulatory Visit: Payer: Self-pay | Admitting: Internal Medicine

## 2020-01-23 MED ORDER — METOPROLOL TARTRATE 25 MG PO TABS
25.0000 mg | ORAL_TABLET | Freq: Two times a day (BID) | ORAL | 3 refills | Status: DC
Start: 2020-01-23 — End: 2020-01-28

## 2020-01-28 ENCOUNTER — Other Ambulatory Visit: Payer: Self-pay | Admitting: Internal Medicine

## 2020-01-28 MED ORDER — METOPROLOL TARTRATE 25 MG PO TABS: 25.0000 mg | ORAL_TABLET | Freq: Two times a day (BID) | ORAL | 3 refills | Status: DC

## 2020-03-23 ENCOUNTER — Ambulatory Visit (INDEPENDENT_AMBULATORY_CARE_PROVIDER_SITE_OTHER): Payer: 59 | Admitting: Internal Medicine

## 2020-03-23 ENCOUNTER — Other Ambulatory Visit: Payer: Self-pay

## 2020-03-23 ENCOUNTER — Encounter: Payer: Self-pay | Admitting: Internal Medicine

## 2020-03-23 DIAGNOSIS — I10 Essential (primary) hypertension: Secondary | ICD-10-CM

## 2020-03-23 DIAGNOSIS — E7800 Pure hypercholesterolemia, unspecified: Secondary | ICD-10-CM

## 2020-03-23 DIAGNOSIS — R739 Hyperglycemia, unspecified: Secondary | ICD-10-CM | POA: Diagnosis not present

## 2020-03-23 LAB — BASIC METABOLIC PANEL
BUN: 23 mg/dL (ref 6–23)
CO2: 29 mEq/L (ref 19–32)
Calcium: 9.6 mg/dL (ref 8.4–10.5)
Chloride: 102 mEq/L (ref 96–112)
Creatinine, Ser: 1.06 mg/dL (ref 0.40–1.50)
GFR: 73.6 mL/min (ref >60.00)
Glucose, Bld: 109 mg/dL — ABNORMAL HIGH (ref 70–99)
Potassium: 3.9 mEq/L (ref 3.5–5.1)
Sodium: 139 mEq/L (ref 135–145)

## 2020-03-23 LAB — HEPATIC FUNCTION PANEL
ALT: 26 U/L (ref 0–53)
AST: 22 U/L (ref 0–37)
Albumin: 4.4 g/dL (ref 3.5–5.2)
Alkaline Phosphatase: 37 U/L — ABNORMAL LOW (ref 39–117)
Bilirubin, Direct: 0.1 mg/dL (ref 0.0–0.3)
Total Bilirubin: 0.6 mg/dL (ref 0.2–1.2)
Total Protein: 7.3 g/dL (ref 6.0–8.3)

## 2020-03-23 LAB — LIPID PANEL
Cholesterol: 137 mg/dL (ref 0–200)
HDL: 31.8 mg/dL — ABNORMAL LOW (ref >39.00)
LDL Cholesterol: 85 mg/dL (ref 0–99)
NonHDL: 105.63
Total CHOL/HDL Ratio: 4
Triglycerides: 104 mg/dL (ref 0.0–149.0)
VLDL: 20.8 mg/dL (ref 0.0–40.0)

## 2020-03-23 LAB — HEMOGLOBIN A1C: Hgb A1c MFr Bld: 6.7 % — ABNORMAL HIGH (ref 4.6–6.5)

## 2020-03-23 NOTE — Progress Notes (Signed)
Patient ID: [name] [name], male   DOB: 08-05-1955   MRN: [id number]   Brief patient profile:  60   yobm quit smoking in early 1980's with hypertension hyperlipidemia and atypical chest pain with a left heart catheter showing 60% obstruction of the right-sided branches in the 1999 and a negative stress study in May of 2007> cabg required 10/2010    History of Present Illness  08/12/2010 ov/[name] [name]  Cc new onset cp comes and goes x 1 week so went to ER July 30th with neg w/u and much less cp since er.  Location = Left of sternum only, aching but   No relation to activity, lasts no more than a few seconds, not noticeable supine or sleeping.  Does ok with exercise with no pains. rec Cardiac eval    DATE OF ADMISSION: 10/18/2010  DATE OF DISCHARGE: 10/24/2010  DISCHARGE SUMMARY  PRIMARY ADMITTING DIAGNOSIS: Chest pain.  ADDITIONAL/DISCHARGE DIAGNOSES:  1. Coronary artery disease.  2. New-onset angina.  3. Hypertension.  4. Hyperlipidemia.  5. Remote history of tobacco abuse.  6. Postoperative acute blood loss anemia.  PROCEDURES PERFORMED:  1. Cardiac catheterization.  2. Coronary artery bypass grafting x4 (left internal mammary artery to  the LAD, saphenous vein graft to the second diagonal, saphenous  vein graft to the first obtuse marginal, saphenous vein graft to  the posterior descending).  3. Endoscopic vein harvest, right leg  04/04/2011 f/u ov/[name] [name] cc new L lat localized hip pain worse standing up, 3-4 weeks comes and goes, not tried nsaids, maybe worse toward end of day rec Try advil up to 3 with meals to see if helps hip pain and if not better within a week call for referral to an orthopedic surgeon> referred     09/02/2016 acute extended ov/[name] [name] re: neck pain  Chief Complaint  Patient presents with  . Acute Visit    Pt c/o feeling anxious recently. He states he has also noticed some dizziness when he bends over.  He states he has also noticed some soreness in the left side  of his neck for the past.   new onset  10 days positional  L neck shoots into L post shoulder  Tylenol helps/ heat helps / no pain with motion of L shoulder/ no numbness or weakness of arm or hand on L  Overdue for cpx  Not getting any ex at all but with ex denies sob or cp  rec Neck stretching x 6 motions, hold each one x 30 secs and repeat as frequently as you can but at least sev times a day especially at bedtime and on rising in am  Heat/ advil 200 up 3 with meals as needed (9 max per 24h)     10/24/16  Back injury at home leaning over to get something out of car > midline low back pain>  Ortho > shots > no more need for flex/advil     04/05/2017 acute  ov/[name] [name] re: new back pain in pt with hbp Chief Complaint  Patient presents with  . Acute Visit    shrap pain in left side for 1 week now   new left sided back pain acute onset x  two weeks prior to OV  When bending over to reach for luggage  Positional /better on lying down or sitting still  Started advil 3 x day some better No radicular features/ rash/ pain with cough or change with urination /bms Other back pain better p injection of pain  at ortho office  rec Tdap today  flexoril 10 mg up to 4 x daily as needed for muscle spasm Advil 3-4 with meals as needed for pain (= up to 12 per day)  Heating pad may help Call your orthopedic doctor for follow up     08/10/17  Neg Myoview     09/05/2018  f/u ov/[name] [name] re:  cpx Chief Complaint  Patient presents with  . Annual Exam    not fasting. Doing well and denies any co's.   Dyspnea:  Better with exertion , tol steps better, losing wt intentionally Cough: none  Sleeping: fine flat in bed  SABA use: non 02: none Back pain s radicular features, worse at bedtime  rec If blood pressure running on low side then break the telmisartan-hctz in half       11/12/2018  f/u ov/[name] [name] re: hbp / can't afford micardis 80-25  Chief Complaint  Patient presents with  . Follow-up    Discuss  formulary- copay on telmisartan has went up.   Dyspnea:  No regular ex , sometimes doe when gets in a big hurry but no assoc cp Cough: no Sleeping: able to lie down flat ok  SABA use: none rec Finish your telmasartan and start losartan-hctz one daily in its place and monitor your blood pressure daily     03/15/2019  f/u ov/[name] [name] re:  HBP/ has had both covid vaccines Moderna last one Feb 4th  2021  Chief Complaint  Patient presents with  . Follow-up    Essential Hypertension  Dyspnea:  No regular exercise  Cough: no Sleeping: able to lie flat SABA use: no 02: no rec increase aerobic ex  09/14/19 ER eval vertigo neg MRI / eval neuro > peripheral vertigo   09/23/2019  f/u ov/[name] [name] re: cpx  Re hbp /IHD / hyperlipidemia  Chief Complaint  Patient presents with  . Follow-up    reports episode of vertigo last week. states one other episode last friday where he took meclizine with relief. also requesting simvastatin refill   Dyspnea:  Not limited by breathing from desired activities   Cough: no Sleeping: no resp  SABA use: none  02: none  rec Lotrimin (clotrimazole) twice daily to groin and keep as dry as possible  - take x 2 weeks repeat if needed     03/23/2020  f/u ov/[name] [name] re: hbp, ihd/ hyperlidemia Chief Complaint  Patient presents with  . Follow-up    No issues at this time  Dyspnea:  Not much ex at all  Cough: none Sleeping: no resp symptoms SABA use: none  02: none  Covid status:   vax x 3    No obvious day to day or daytime variability or assoc excess/ purulent sputum or mucus plugs or hemoptysis or cp or chest tightness, subjective wheeze or overt sinus or hb symptoms.   Sleeping without nocturnal  or early am exacerbation  of respiratory  c/o's or need for noct saba. Also denies any obvious fluctuation of symptoms with weather or environmental changes or other aggravating or alleviating factors except as outlined above   No unusual exposure hx or h/o childhood pna/  asthma or knowledge of premature birth.  Current Allergies, Complete Past Medical History, Past Surgical History, Family History, and Social History were reviewed in [employer] record.  ROS  The following are not active complaints unless bolded Hoarseness, sore throat, dysphagia, dental problems, itching, sneezing,  nasal congestion or discharge of excess mucus  or purulent secretions, ear ache,   fever, chills, sweats, unintended wt loss or wt gain, classically pleuritic or exertional cp,  orthopnea pnd or arm/hand swelling  or leg swelling, presyncope, palpitations, abdominal pain, anorexia, nausea, vomiting, diarrhea  or change in bowel habits or change in bladder habits, change in stools or change in urine, dysuria, hematuria,  rash, arthralgias, visual complaints, headache, numbness, weakness or ataxia or problems with walking or coordination,  change in mood or  memory.        Current Meds  Medication Sig  . acetaminophen (TYLENOL) 650 MG CR tablet Take 650 mg by mouth every 8 (eight) hours as needed for pain.  Marland Kitchen aspirin EC 81 MG tablet Take 1 tablet (81 mg total) by mouth daily.  . Camphor-Menthol-Methyl Sal (SALONPAS) 3.01-16-08 % PTCH Apply 1 patch topically daily as needed (back pain).  . clotrimazole (LOTRIMIN) 1 % cream Apply 1 application topically 2 (two) times daily.  Marland Kitchen losartan-hydrochlorothiazide (HYZAAR) 100-25 MG tablet TAKE 1 TABLET BY MOUTH EVERY DAY  . meclizine (ANTIVERT) 25 MG tablet Take 1 tablet (25 mg total) by mouth 3 (three) times daily as needed for dizziness.  . Menthol, Topical Analgesic, (BENGAY EX) Apply 1 application topically daily as needed (back pain).  . metoprolol tartrate (LOPRESSOR) 25 MG tablet Take 1 tablet (25 mg total) by mouth 2 (two) times daily.  . nitroGLYCERIN (NITROSTAT) 0.4 MG SL tablet Place 1 tablet (0.4 mg total) under the tongue every 5 (five) minutes as needed for chest pain.  . simvastatin (ZOCOR) 40 MG tablet Take 1  tablet (40 mg total) by mouth at bedtime. TAKE 1 TABLET BY MOUTH EVERYDAY AT BEDTIME           Past Medical History from [facility]: S/p THR on L Blackman and Erskine Emery PA  HYPERTENSION, BENIGN ESSENTIAL, CONTROLLED (ICD-401.1)  OBESITY  - Target wt = 190 for BMI < 30  Colon polyps..............................................................................................  - colonoscopy 4/04, letter sent 05/15/07 reviewed with pt July 17, 2008 and August 03, 2009 > referred back to GI  CHEST PAIN (ICD-786.50) ....................................................................................  Nishan - s/p cabg 10/19/2010 HYPERLIPIDEMIA  - target LDL less than 70 based on documented ischemic heart disease  HEALTH MAINTENANCE..........................................................................................[name]  - TD 04/05/2017  - Prevnar 13 09/05/2018  - CPX    09/25/2019      Family History:  hypertension in mother and father no premature heart disease in any direct relatives nor stroke diabetes or cancer, except that his mother has developed leukemia  1 brother healthy  Neg prostate ca     Social History:  quit smoking about 1983 denies alcohol use.  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           Objective:   Physical Exam    03/23/2020    210  09/23/2019  209 03/15/2019     201 11/12/2018   201  09/05/2018  197  wt 201 November 20, 2007 > 205 April 18, 2008 > 184 01/03/2011 > 182 04/04/2011 > 07/04/2011  185  >189 09/19/2011 > 01/02/2012 194 >204 02/09/2012 >196 04/04/2012 > 04/25/2012 194 > 11/02/2012  193 > 01/19/2013  190 >  05/23/2014  193 > 05/25/2015 196 > 09/02/2016   200   > 04/05/2017   207 > 05/18/2017  99% RA> 09/04/2017 203>  03/08/2018   204    Vital signs reviewed  03/23/2020  - Note at rest 02 sats  97% on RA   General appearance:  amb pleasant bm nad   HEENT : pt wearing mask not removed for exam due to covid -19 concerns.    NECK :  without JVD/Nodes/TM/ nl  carotid upstrokes bilaterally   LUNGS: no acc muscle use,  Nl contour chest which is clear to A and P bilaterally without cough on insp or exp maneuvers   CV:  RRR  no s3 or murmur or increase in P2, and no edema   ABD:  soft and nontender with nl inspiratory excursion in the supine position. No bruits or organomegaly appreciated, bowel sounds nl  MS:  Nl gait/ ext warm without deformities, calf tenderness, cyanosis or clubbing No obvious joint restrictions   SKIN: warm and dry without lesions    NEURO:  alert, approp, nl sensorium with  no motor or cerebellar deficits apparent.          Labs ordered/ reviewed:      Chemistry      Component Value Date/Time   NA 139 03/23/2020 0929   NA 140 08/08/2017 0901   K 3.9 03/23/2020 0929   CL 102 03/23/2020 0929   CO2 29 03/23/2020 0929   BUN 23 03/23/2020 0929   BUN 21 08/08/2017 0901   CREATININE 1.06 03/23/2020 0929      Component Value Date/Time   CALCIUM 9.6 03/23/2020 0929   ALKPHOS 37 (L) 03/23/2020 0929   AST 22 03/23/2020 0929   ALT 26 03/23/2020 0929   BILITOT 0.6 03/23/2020 0929   BILITOT 0.3 08/08/2017 0901                            Assessment:

## 2020-03-23 NOTE — Patient Instructions (Signed)
Weight control is simply a matter of calorie balance which needs to be tilted in your favor by eating less and exercising more.  To get the most out of exercise, you need to be continuously aware that you are short of breath, but never out of breath, for 30 minutes daily. As you improve, it will actually be easier for you to do the same amount of exercise  in  30 minutes so always push to the level where you are short of breath.     Please remember to go to the lab department   for your tests - we will call you with the results when they are available.      Please schedule a follow up visit in 6 months but call sooner if needed - for   CPX on return

## 2020-03-23 NOTE — Progress Notes (Signed)
Spoke with pt and notified of results per Dr. [name]. Pt verbalized understanding and denied any questions. 

## 2020-03-24 ENCOUNTER — Encounter: Payer: Self-pay | Admitting: Internal Medicine

## 2020-03-24 NOTE — Assessment & Plan Note (Signed)
Lab Results  Component Value Date   HGBA1C 6.7 (H) 03/23/2020   HGBA1C 6.4 (H) 10/18/2010    trending upward assoc with wt gain > wt loss reviewed and f/u at 6 m  Discussed in detail all the  indications, usual  risks and alternatives  relative to the benefits with patient who agrees to proceed with Rx as outlined.             Each maintenance medication was reviewed in detail including emphasizing most importantly the difference between maintenance and prns and under what circumstances the prns are to be triggered using an action plan format where appropriate.  Total time for H and P, chart review, counseling, reviewing  and generating customized AVS unique to this office visit / same day charting = 23 min

## 2020-03-24 NOTE — Assessment & Plan Note (Signed)
Target LDL < 70 since has known CAD  Lab Results  Component Value Date   CHOL 137 03/23/2020   HDL 31.80 (L) 03/23/2020   LDLCALC 85 03/23/2020   TRIG 104.0 03/23/2020   CHOLHDL 4 03/23/2020    Adequate control on present rx, reviewed in detail with pt > no change in rx needed

## 2020-03-24 NOTE — Assessment & Plan Note (Signed)
Lab Results  Component Value Date   CREATININE 1.06 03/23/2020   CREATININE 0.90 09/14/2019   CREATININE 1.12 09/14/2019     Adequate control on present rx, reviewed in detail with pt > no change in rx needed

## 2020-05-07 ENCOUNTER — Encounter: Payer: Self-pay | Admitting: Gastroenterology

## 2020-05-27 NOTE — Progress Notes (Signed)
Cardiology Office Note    Date:  06/03/2020   ID:  [name] [name], DOB Dec 24, 1955, MRN [id number]   PCP:  Tanda Rockers, MD   [place]  Cardiologist:  Jenkins Rouge, MD   Advanced Practice Provider:  No care team member to display Electrophysiologist:  None   [phone]   No chief complaint on file.   History of Present Illness:  [name] [name] is a 65 y.o. male with history of CAD status post CABG in 2012 low risk Myoview 2019, hypertension, HLD.  Last Dr. Johnsie Cancel 10/17/2019 and was doing well.  Patient comes in for f/u. Denies chest pain, dyspnea, dizziness, or presyncope. Doesn't get any regular exercise.   Labs reviewed from 03/23/2020 LDL 85, A1c 6.7    Past Medical History:  Diagnosis Date  . Arthritis   . Blood clot in vein    superficial / right leg following CABG  . CAD (coronary artery disease)   . Chest pain   . Colon polyps   . Complication of anesthesia    hard to wake up  . Essential hypertension, benign   . Headache    migraines as teenager  . Heart murmur   . History of blood transfusion   . History of kidney stones   . Hyperlipemia   . Obesity   . Tobacco abuse     Past Surgical History:  Procedure Laterality Date  . CARDIAC CATHETERIZATION    . CIRCUMCISION  1986  . CORONARY ARTERY BYPASS GRAFT    . Coronary artery bypass grafting x4  10/21/2010  . CYSTOSCOPY WITH RETROGRADE PYELOGRAM, URETEROSCOPY AND STENT PLACEMENT Left 09/01/2014   Procedure: CYSTOSCOPY WITH RETROGRADE PYELOGRAM, URETEROSCOPY AND STENT PLACEMENT;  Surgeon: Irine Seal, MD;  Location: [facility];  Service: Urology;  Laterality: Left;  . CYSTOSCOPY WITH URETEROSCOPY AND STENT PLACEMENT Left 09/09/2014   Procedure: CYSTOSCOPY,LEFT URETEROSCOPY AND STENT PLACEMENT;  Surgeon: Irine Seal, MD;  Location: [facility];  Service: Urology;  Laterality: Left;  . HEMORRHOID SURGERY    . HOLMIUM LASER APPLICATION Left 0/35/0093   Procedure: WITH HOLMIUM LASER ;   Surgeon: Irine Seal, MD;  Location: [facility];  Service: Urology;  Laterality: Left;  . Stapled hemorrhoidopexy.  10/19/2006  . TOTAL HIP ARTHROPLASTY Left 05/29/2015   Procedure: LEFT TOTAL HIP ARTHROPLASTY ANTERIOR APPROACH;  Surgeon: Mcarthur Rossetti, MD;  Location: [facility];  Service: Orthopedics;  Laterality: Left;  Marland Kitchen VASECTOMY  1986    Current Medications: Current Meds  Medication Sig  . acetaminophen (TYLENOL) 650 MG CR tablet Take 650 mg by mouth every 8 (eight) hours as needed for pain.  Marland Kitchen aspirin EC 81 MG tablet Take 1 tablet (81 mg total) by mouth daily.  . Camphor-Menthol-Methyl Sal (SALONPAS) 3.01-16-08 % PTCH Apply 1 patch topically daily as needed (back pain).  . clotrimazole (LOTRIMIN) 1 % cream Apply 1 application topically 2 (two) times daily.  Marland Kitchen losartan-hydrochlorothiazide (HYZAAR) 100-25 MG tablet Take 1 tablet by mouth daily.  . meclizine (ANTIVERT) 25 MG tablet Take 1 tablet (25 mg total) by mouth 3 (three) times daily as needed for dizziness.  . Menthol, Topical Analgesic, (BENGAY EX) Apply 1 application topically daily as needed (back pain).  . metoprolol tartrate (LOPRESSOR) 25 MG tablet Take 1 tablet (25 mg total) by mouth 2 (two) times daily.  . nitroGLYCERIN (NITROSTAT) 0.4 MG SL tablet Place 1 tablet (0.4 mg total) under the tongue every 5 (five) minutes as needed  for chest pain.  . simvastatin (ZOCOR) 40 MG tablet Take 2 tablets (80 mg total) by mouth at bedtime.  . [DISCONTINUED] simvastatin (ZOCOR) 40 MG tablet Take 1 tablet (40 mg total) by mouth at bedtime. TAKE 1 TABLET BY MOUTH EVERYDAY AT BEDTIME     Allergies:   Codeine   Social History   Socioeconomic History  . Marital status: Married    Spouse name: Not on file  . Number of children: Not on file  . Years of education: Not on file  . Highest education level: Not on file  Occupational History  . Occupation: [employer]  Tobacco Use  . Smoking status: Former Smoker    Packs/day: 0.50     Years: 7.00    Pack years: 3.50    Types: Cigarettes    Quit date: 01/10/1981    Years since quitting: 39.4  . Smokeless tobacco: Never Used  Vaping Use  . Vaping Use: Never used  Substance and Sexual Activity  . Alcohol use: No    Alcohol/week: 0.0 standard drinks  . Drug use: No  . Sexual activity: Not on file  Other Topics Concern  . Not on file  Social History Narrative   Semi aerobic workouts   Social Determinants of Health   Financial Resource Strain: Not on file  Food Insecurity: Not on file  Transportation Needs: Not on file  Physical Activity: Not on file  Stress: Not on file  Social Connections: Not on file     Family History:  The patient's family history includes Hypertension in his father and mother; Leukemia in his mother.   ROS:   Please see the history of present illness.    ROS All other systems reviewed and are negative.   PHYSICAL EXAM:   VS:  BP 130/78   Pulse (!) 50   Ht _0  (1.727 m)   Wt 207 lb 9.6 oz (94.2 kg)   SpO2 98%   BMI 31.57 kg/m   Physical Exam  ZOX:WRUEA, in no acute distress  Neck: no JVD, carotid bruits, or masses Cardiac:RRR; no murmurs, rubs, or gallops  Respiratory:  clear to auscultation bilaterally, normal work of breathing GI: soft, nontender, nondistended, + BS Ext: without cyanosis, clubbing, or edema, Good distal pulses bilaterally Neuro:  Alert and Oriented x 3 Psych: euthymic mood, full affect  Wt Readings from Last 3 Encounters:  06/03/20 207 lb 9.6 oz (94.2 kg)  03/23/20 210 lb 12.8 oz (95.6 kg)  10/17/19 205 lb 6.4 oz (93.2 kg)      Studies/Labs Reviewed:   EKG:  EKG is not ordered today.   Recent Labs: 09/14/2019: Hemoglobin 12.9; Platelets 224 09/23/2019: TSH 1.70 03/23/2020: ALT 26; BUN 23; Creatinine, Ser 1.06; Potassium 3.9; Sodium 139   Lipid Panel    Component Value Date/Time   CHOL 137 03/23/2020 0929   CHOL 140 08/08/2017 0901   TRIG 104.0 03/23/2020 0929   HDL 31.80 (L) 03/23/2020 0929    HDL 36 (L) 08/08/2017 0901   CHOLHDL 4 03/23/2020 0929   VLDL 20.8 03/23/2020 0929   LDLCALC 85 03/23/2020 0929   LDLCALC 85 08/08/2017 0901    Additional studies/ records that were reviewed today include:  S/P CABG 10/19/10 [facility] F/U CAD  SURGICAL PROCEDURE: Coronary artery bypass grafting x4 with the left  internal mammary to the left anterior descending coronary artery,  reverse saphenous vein graft to the second diagonal coronary artery,  reverse saphenous vein graft to the first  obtuse marginal, reverse  saphenous vein graft to the posterior descending coronary artery with  right leg endovein harvesting.    Myovue 08/10/17 non ischemic    Duplex 02/27/14  Plaque no stenosis f/u again 02/2016        Risk Assessment/Calculations:         ASSESSMENT:    1. Coronary artery disease involving coronary bypass graft of native heart with unstable angina pectoris ([place])   2. Essential hypertension   3. Hyperlipidemia, unspecified hyperlipidemia type      PLAN:  In order of problems listed above: CAD status post CABG in 2012, low risk Myoview 2019, no angina, continue ASA, metoprolol and hyzaar 150 min exercise weekly, weight loss  Hypertension on Hyzaar and metoprolol  Hyperlipidemia LDL 85 03/23/2020 on Zocor 40 mg daily.increase to 80 mg daily and check FLP in 3 months.   Shared Decision Making/Informed Consent        Medication Adjustments/Labs and Tests Ordered: Current medicines are reviewed at length with the patient today.  Concerns regarding medicines are outlined above.  Medication changes, Labs and Tests ordered today are listed in the Patient Instructions below. Patient Instructions   Medication Instructions:  Increase your Zocor to 46m once daily.   Labwork: Please return in 3 months for a fasting lipid panel. Do not eat or drink 12 hours prior to your blood work. You may have water, unsweetened tea and black coffee prior.   Testing/Procedures: None  ordered.   Follow-Up: Your physician recommends that you schedule a follow-up appointment in:   12 months with Dr. NJohnsie Cancel Any Other Special Instructions Will Be Listed Below (If Applicable).  Be sure to get 150 minutes of exercise each week. Weight Loss is encouraged.    If you need a refill on your cardiac medications before your next appointment, please call your pharmacy.   Exercise Information for Aging Adults Staying physically active is important as you age. The four types of exercises that are best for older adults are endurance, strength, balance, and flexibility. Contact your health care provider before you start any exercise routine. Ask your health care provider what activities are safe for you. What are the risks? Risks associated with exercising include:  Overdoing it. This may lead to sore muscles or fatigue.  Falls.  Injuries.  Dehydration. How to do these exercises Endurance exercises Endurance (aerobic) exercises raise your breathing rate and heart rate. Increasing your endurance helps you to do everyday tasks and stay healthy. By improving the health of your body system that includes your heart, lungs, and blood vessels (circulatory system), you may also delay or prevent diseases such as heart disease, diabetes, and bone loss (osteoporosis). Types of endurance exercises include:  Sports.  Indoor activities, such as using gym equipment, doing water aerobics, or dancing.  Outdoor activities, such as biking or jogging.  Tasks around the house, such as gardening, yard work, and heavy household chores like cleaning.  Walking, such as hiking or walking around your neighborhood. When doing endurance exercises, make sure you:  Are aware of your surroundings.  Use safety equipment as directed.  Dress in layers when exercising outdoors.  Drink plenty of water to stay well hydrated. Build up endurance slowly. Start with 10 minutes at a time, and gradually  build up to doing 30 minutes at a time. Unless your health care provider gave you different instructions, aim to exercise for a total of 150 minutes a week. Spread out that time so  you are working on endurance on 3 or more days a week.   Strength exercises Lifting, pulling, or pushing weights helps to strengthen muscles. Having stronger muscles makes it easier to do everyday activities, such as getting up from a chair, climbing stairs, carrying groceries, and playing with grandchildren. Strength exercises include arm and leg exercises that may be done:  With weights.  Without weights (using your own body weight).  With a resistance band. When doing strength exercises:  Move smoothly and steadily. Do not suddenly thrust or jerk the weights, the resistance band, or your body.  Start with no weights or with light weights, and gradually add more weight over time. Eventually, aim to use weights that are hard or very hard for you to lift. This means that you are able to do 8 repetitions with the weight, and the last few repetitions are very challenging.  Lift or push weights into position for 3 seconds, hold the position for 1 second, and then take 3 seconds to return to your starting position.  Breathe out (exhale) during difficult movements, like lifting or pushing weights. Breathe in (inhale) to relax your muscles before the next repetition.  Consider alternating arms or legs, especially when you first start strength exercises.  Expect some slight muscle soreness after each session. Do strength exercises on 2 or more days a week, for 30 minutes at a time. Avoid exercising the same muscle groups two days in a row. For example, if you work on your leg muscles one day, work on your arm muscles the next day. When you can do two sets of 10-15 repetitions with a certain weight, increase the amount of weight.   Balance Balance exercises can help to prevent falls. Balance exercises include:  Standing  on one foot.  Heel-to-toe walk.  Balance walk.  Tai chi. Make sure you have something sturdy to hold onto while doing balance exercises, such as a sturdy chair. As your balance improves, challenge yourself by holding onto the chair with one hand instead of two, and then with no hands. Trying exercises with your eyes closed also challenges your balance, but be sure to have a sturdy surface (like a countertop) close by in case you need it. Do balance exercises as often as you want, or as often as directed by your health care provider. Strength exercises for the lower body also help to improve balance. Flexibility Flexibility exercises improve how far you can bend, straighten, move, or rotate parts of your body (range of motion). These exercises also help you to do everyday activities such as getting dressed or reaching for objects. Flexibility exercises include stretching different parts of the body, and they may be done in a standing or seated position or on the floor. When stretching, make sure you:  Keep a slight bend in your arms and legs. Avoid completely straightening ("locking") your joints.  Do not stretch so far that you feel pain. You should feel a mild stretching feeling. You may try stretching farther as you become more flexible over time.  Relax and breathe between stretches.  Hold onto something sturdy for balance as needed. Hold each stretch for 10-30 seconds. Repeat each stretch 3-5 times.   General safety tips  Exercise in well-lit areas.  Do not hold your breath during exercises or stretches.  Warm up before exercising, and cool down after exercising. This can help prevent injury.  Drink plenty of water during exercise or any activity that makes you sweat.  Use smooth, steady movements. Do not use sudden, jerking movements, especially when lifting weights or doing flexibility exercises.  If you are not sure if an exercise is safe for you, or you are not sure how to do  an exercise, talk with your health care provider. This is especially important if you have had surgery on muscles, bones, or joints (orthopedic surgery). Where to find more information You can find more information about exercise for older adults from:  Your local health department, fitness center, or community center. These facilities may have programs for aging adults.  [employer] on Aging: http://kim-miller.com/  National Council on Aging: www.ncoa.org Summary  Staying physically active is important as you age.  Make sure to contact your health care provider before you start any exercise routine. Ask your health care provider what activities are safe for you.  Doing endurance, strength, balance, and flexibility exercises can help to delay or prevent certain diseases, such as heart disease, diabetes, and bone loss (osteoporosis). This information is not intended to replace advice given to you by your health care provider. Make sure you discuss any questions you have with your health care provider. Document Revised: 04/17/2019 Document Reviewed: 04/17/2019 Elsevier Patient Education  2021 [place] Following a healthy eating pattern may help you to achieve and maintain a healthy body weight, reduce the risk of chronic disease, and live a long and productive life. It is important to follow a healthy eating pattern at an appropriate calorie level for your body. Your nutritional needs should be met primarily through food by choosing a variety of nutrient-rich foods. What are tips for following this plan? Reading food labels  Read labels and choose the following: ? Reduced or low sodium. ? Juices with 100% fruit juice. ? Foods with low saturated fats and high polyunsaturated and monounsaturated fats. ? Foods with whole grains, such as whole wheat, cracked wheat, brown rice, and wild rice. ? Whole grains that are fortified with folic acid. This is recommended for  women who are pregnant or who want to become pregnant.  Read labels and avoid the following: ? Foods with a lot of added sugars. These include foods that contain brown sugar, corn sweetener, corn syrup, dextrose, fructose, glucose, high-fructose corn syrup, honey, invert sugar, lactose, malt syrup, maltose, molasses, raw sugar, sucrose, trehalose, or turbinado sugar.  Do not eat more than the following amounts of added sugar per day:  6 teaspoons (25 g) for women.  9 teaspoons (38 g) for men. ? Foods that contain processed or refined starches and grains. ? Refined grain products, such as white flour, degermed cornmeal, white bread, and white rice. Shopping  Choose nutrient-rich snacks, such as vegetables, whole fruits, and nuts. Avoid high-calorie and high-sugar snacks, such as potato chips, fruit snacks, and candy.  Use oil-based dressings and spreads on foods instead of solid fats such as butter, stick margarine, or cream cheese.  Limit pre-made sauces, mixes, and "instant" products such as flavored rice, instant noodles, and ready-made pasta.  Try more plant-protein sources, such as tofu, tempeh, black beans, edamame, lentils, nuts, and seeds.  Explore eating plans such as the Mediterranean diet or vegetarian diet. Cooking  Use oil to saut or stir-fry foods instead of solid fats such as butter, stick margarine, or lard.  Try baking, boiling, grilling, or broiling instead of frying.  Remove the fatty part of meats before cooking.  Steam vegetables in water or broth. Meal planning  At meals,  imagine dividing your plate into fourths: ? One-half of your plate is fruits and vegetables. ? One-fourth of your plate is whole grains. ? One-fourth of your plate is protein, especially lean meats, poultry, eggs, tofu, beans, or nuts.  Include low-fat dairy as part of your daily diet.   Lifestyle  Choose healthy options in all settings, including home, work, school, restaurants, or  stores.  Prepare your food safely: ? Wash your hands after handling raw meats. ? Keep food preparation surfaces clean by regularly washing with hot, soapy water. ? Keep raw meats separate from ready-to-eat foods, such as fruits and vegetables. ? Cook seafood, meat, poultry, and eggs to the recommended internal temperature. ? Store foods at safe temperatures. In general:  Keep cold foods at 14F (4.4C) or below.  Keep hot foods at 114F (60C) or above.  Keep your freezer at [facility] (-17.8C) or below.  Foods are no longer safe to eat when they have been between the temperatures of 40-114F (4.4-60C) for more than 2 hours. What foods should I eat? Fruits Aim to eat 2 cup-equivalents of fresh, canned (in natural juice), or frozen fruits each day. Examples of 1 cup-equivalent of fruit include 1 small apple, 8 large strawberries, 1 cup canned fruit,  cup dried fruit, or 1 cup 100% juice. Vegetables Aim to eat 2-3 cup-equivalents of fresh and frozen vegetables each day, including different varieties and colors. Examples of 1 cup-equivalent of vegetables include 2 medium carrots, 2 cups raw, leafy greens, 1 cup chopped vegetable (raw or cooked), or 1 medium baked potato. Grains Aim to eat 6 ounce-equivalents of whole grains each day. Examples of 1 ounce-equivalent of grains include 1 slice of bread, 1 cup ready-to-eat cereal, 3 cups popcorn, or  cup cooked rice, pasta, or cereal. Meats and other proteins Aim to eat 5-6 ounce-equivalents of protein each day. Examples of 1 ounce-equivalent of protein include 1 egg, 1/2 cup nuts or seeds, or 1 tablespoon (16 g) peanut butter. A cut of meat or fish that is the size of a deck of cards is about 3-4 ounce-equivalents.  Of the protein you eat each week, try to have at least 8 ounces come from seafood. This includes salmon, trout, herring, and anchovies. Dairy Aim to eat 3 cup-equivalents of fat-free or low-fat dairy each day. Examples of 1  cup-equivalent of dairy include 1 cup (240 mL) milk, 8 ounces (250 g) yogurt, 1 ounces (44 g) natural cheese, or 1 cup (240 mL) fortified soy milk. Fats and oils  Aim for about 5 teaspoons (21 g) per day. Choose monounsaturated fats, such as canola and olive oils, avocados, peanut butter, and most nuts, or polyunsaturated fats, such as sunflower, corn, and soybean oils, walnuts, pine nuts, sesame seeds, sunflower seeds, and flaxseed. Beverages  Aim for six 8-oz glasses of water per day. Limit coffee to three to five 8-oz cups per day.  Limit caffeinated beverages that have added calories, such as soda and energy drinks.  Limit alcohol intake to no more than 1 drink a day for nonpregnant women and 2 drinks a day for men. One drink equals 12 oz of beer (355 mL), 5 oz of wine (148 mL), or 1 oz of hard liquor (44 mL). Seasoning and other foods  Avoid adding excess amounts of salt to your foods. Try flavoring foods with herbs and spices instead of salt.  Avoid adding sugar to foods.  Try using oil-based dressings, sauces, and spreads instead of solid fats. This information  is based on general [place] nutrition guidelines. For more information, visit BuildDNA.es. Exact amounts may vary based on your nutrition needs. Summary  A healthy eating plan may help you to maintain a healthy weight, reduce the risk of chronic diseases, and stay active throughout your life.  Plan your meals. Make sure you eat the right portions of a variety of nutrient-rich foods.  Try baking, boiling, grilling, or broiling instead of frying.  Choose healthy options in all settings, including home, work, school, restaurants, or stores. This information is not intended to replace advice given to you by your health care provider. Make sure you discuss any questions you have with your health care provider. Document Revised: 04/10/2017 Document Reviewed: 04/10/2017 Elsevier Patient Education  2021 [place], Ermalinda Barrios, [place]  06/03/2020 9:55 AM    [facility] [address], [place], [place]  [address] Phone: [phone]; Fax: [phone]

## 2020-05-28 ENCOUNTER — Other Ambulatory Visit: Payer: Self-pay

## 2020-05-28 MED ORDER — LOSARTAN POTASSIUM-HCTZ 100-25 MG PO TABS: 1.0000 | ORAL_TABLET | Freq: Every day | ORAL | 2 refills | Status: DC

## 2020-05-28 NOTE — Telephone Encounter (Signed)
Rx for Losartan 100mg  has been sent to [facility] as requested by pharmacy.  Patient last seen 03/29/20 with pending OV for 09/23/2020.

## 2020-06-03 ENCOUNTER — Ambulatory Visit (INDEPENDENT_AMBULATORY_CARE_PROVIDER_SITE_OTHER): Payer: 59 | Admitting: Physician Assistant

## 2020-06-03 ENCOUNTER — Other Ambulatory Visit: Payer: Self-pay

## 2020-06-03 ENCOUNTER — Encounter: Payer: Self-pay | Admitting: Physician Assistant

## 2020-06-03 VITALS — BP 130/78 | HR 50 | Ht 68.0 in | Wt 207.6 lb

## 2020-06-03 DIAGNOSIS — E785 Hyperlipidemia, unspecified: Secondary | ICD-10-CM | POA: Diagnosis not present

## 2020-06-03 DIAGNOSIS — I10 Essential (primary) hypertension: Secondary | ICD-10-CM | POA: Diagnosis not present

## 2020-06-03 DIAGNOSIS — I25700 Atherosclerosis of coronary artery bypass graft(s), unspecified, with unstable angina pectoris: Secondary | ICD-10-CM | POA: Diagnosis not present

## 2020-06-03 MED ORDER — SIMVASTATIN 40 MG PO TABS: 80.0000 mg | ORAL_TABLET | Freq: Every day | ORAL | 3 refills | Status: DC

## 2020-06-03 NOTE — Patient Instructions (Addendum)
Medication Instructions:  Increase your Zocor to 56m once daily.   Labwork: Please return in 3 months for a fasting lipid panel. Do not eat or drink 12 hours prior to your blood work. You may have water, unsweetened tea and black coffee prior.   Testing/Procedures: None ordered.   Follow-Up: Your physician recommends that you schedule a follow-up appointment in:   12 months with Dr. NJohnsie Cancel Any Other Special Instructions Will Be Listed Below (If Applicable).  Be sure to get 150 minutes of exercise each week. Weight Loss is encouraged.    If you need a refill on your cardiac medications before your next appointment, please call your pharmacy.   Exercise Information for Aging Adults Staying physically active is important as you age. The four types of exercises that are best for older adults are endurance, strength, balance, and flexibility. Contact your health care provider before you start any exercise routine. Ask your health care provider what activities are safe for you. What are the risks? Risks associated with exercising include:  Overdoing it. This may lead to sore muscles or fatigue.  Falls.  Injuries.  Dehydration. How to do these exercises Endurance exercises Endurance (aerobic) exercises raise your breathing rate and heart rate. Increasing your endurance helps you to do everyday tasks and stay healthy. By improving the health of your body system that includes your heart, lungs, and blood vessels (circulatory system), you may also delay or prevent diseases such as heart disease, diabetes, and bone loss (osteoporosis). Types of endurance exercises include:  Sports.  Indoor activities, such as using gym equipment, doing water aerobics, or dancing.  Outdoor activities, such as biking or jogging.  Tasks around the house, such as gardening, yard work, and heavy household chores like cleaning.  Walking, such as hiking or walking around your neighborhood. When doing  endurance exercises, make sure you:  Are aware of your surroundings.  Use safety equipment as directed.  Dress in layers when exercising outdoors.  Drink plenty of water to stay well hydrated. Build up endurance slowly. Start with 10 minutes at a time, and gradually build up to doing 30 minutes at a time. Unless your health care provider gave you different instructions, aim to exercise for a total of 150 minutes a week. Spread out that time so you are working on endurance on 3 or more days a week.   Strength exercises Lifting, pulling, or pushing weights helps to strengthen muscles. Having stronger muscles makes it easier to do everyday activities, such as getting up from a chair, climbing stairs, carrying groceries, and playing with grandchildren. Strength exercises include arm and leg exercises that may be done:  With weights.  Without weights (using your own body weight).  With a resistance band. When doing strength exercises:  Move smoothly and steadily. Do not suddenly thrust or jerk the weights, the resistance band, or your body.  Start with no weights or with light weights, and gradually add more weight over time. Eventually, aim to use weights that are hard or very hard for you to lift. This means that you are able to do 8 repetitions with the weight, and the last few repetitions are very challenging.  Lift or push weights into position for 3 seconds, hold the position for 1 second, and then take 3 seconds to return to your starting position.  Breathe out (exhale) during difficult movements, like lifting or pushing weights. Breathe in (inhale) to relax your muscles before the next repetition.  Consider  alternating arms or legs, especially when you first start strength exercises.  Expect some slight muscle soreness after each session. Do strength exercises on 2 or more days a week, for 30 minutes at a time. Avoid exercising the same muscle groups two days in a row. For example,  if you work on your leg muscles one day, work on your arm muscles the next day. When you can do two sets of 10-15 repetitions with a certain weight, increase the amount of weight.   Balance Balance exercises can help to prevent falls. Balance exercises include:  Standing on one foot.  Heel-to-toe walk.  Balance walk.  Tai chi. Make sure you have something sturdy to hold onto while doing balance exercises, such as a sturdy chair. As your balance improves, challenge yourself by holding onto the chair with one hand instead of two, and then with no hands. Trying exercises with your eyes closed also challenges your balance, but be sure to have a sturdy surface (like a countertop) close by in case you need it. Do balance exercises as often as you want, or as often as directed by your health care provider. Strength exercises for the lower body also help to improve balance. Flexibility Flexibility exercises improve how far you can bend, straighten, move, or rotate parts of your body (range of motion). These exercises also help you to do everyday activities such as getting dressed or reaching for objects. Flexibility exercises include stretching different parts of the body, and they may be done in a standing or seated position or on the floor. When stretching, make sure you:  Keep a slight bend in your arms and legs. Avoid completely straightening ("locking") your joints.  Do not stretch so far that you feel pain. You should feel a mild stretching feeling. You may try stretching farther as you become more flexible over time.  Relax and breathe between stretches.  Hold onto something sturdy for balance as needed. Hold each stretch for 10-30 seconds. Repeat each stretch 3-5 times.   General safety tips  Exercise in well-lit areas.  Do not hold your breath during exercises or stretches.  Warm up before exercising, and cool down after exercising. This can help prevent injury.  Drink plenty of  water during exercise or any activity that makes you sweat.  Use smooth, steady movements. Do not use sudden, jerking movements, especially when lifting weights or doing flexibility exercises.  If you are not sure if an exercise is safe for you, or you are not sure how to do an exercise, talk with your health care provider. This is especially important if you have had surgery on muscles, bones, or joints (orthopedic surgery). Where to find more information You can find more information about exercise for older adults from:  Your local health department, fitness center, or community center. These facilities may have programs for aging adults.  [employer] on Aging: http://kim-miller.com/  National Council on Aging: www.ncoa.org Summary  Staying physically active is important as you age.  Make sure to contact your health care provider before you start any exercise routine. Ask your health care provider what activities are safe for you.  Doing endurance, strength, balance, and flexibility exercises can help to delay or prevent certain diseases, such as heart disease, diabetes, and bone loss (osteoporosis). This information is not intended to replace advice given to you by your health care provider. Make sure you discuss any questions you have with your health care provider. Document Revised: 04/17/2019 Document  Reviewed: 04/17/2019 Elsevier Patient Education  2021 [place] Following a healthy eating pattern may help you to achieve and maintain a healthy body weight, reduce the risk of chronic disease, and live a long and productive life. It is important to follow a healthy eating pattern at an appropriate calorie level for your body. Your nutritional needs should be met primarily through food by choosing a variety of nutrient-rich foods. What are tips for following this plan? Reading food labels  Read labels and choose the following: ? Reduced or low  sodium. ? Juices with 100% fruit juice. ? Foods with low saturated fats and high polyunsaturated and monounsaturated fats. ? Foods with whole grains, such as whole wheat, cracked wheat, brown rice, and wild rice. ? Whole grains that are fortified with folic acid. This is recommended for women who are pregnant or who want to become pregnant.  Read labels and avoid the following: ? Foods with a lot of added sugars. These include foods that contain brown sugar, corn sweetener, corn syrup, dextrose, fructose, glucose, high-fructose corn syrup, honey, invert sugar, lactose, malt syrup, maltose, molasses, raw sugar, sucrose, trehalose, or turbinado sugar.  Do not eat more than the following amounts of added sugar per day:  6 teaspoons (25 g) for women.  9 teaspoons (38 g) for men. ? Foods that contain processed or refined starches and grains. ? Refined grain products, such as white flour, degermed cornmeal, white bread, and white rice. Shopping  Choose nutrient-rich snacks, such as vegetables, whole fruits, and nuts. Avoid high-calorie and high-sugar snacks, such as potato chips, fruit snacks, and candy.  Use oil-based dressings and spreads on foods instead of solid fats such as butter, stick margarine, or cream cheese.  Limit pre-made sauces, mixes, and "instant" products such as flavored rice, instant noodles, and ready-made pasta.  Try more plant-protein sources, such as tofu, tempeh, black beans, edamame, lentils, nuts, and seeds.  Explore eating plans such as the Mediterranean diet or vegetarian diet. Cooking  Use oil to saut or stir-fry foods instead of solid fats such as butter, stick margarine, or lard.  Try baking, boiling, grilling, or broiling instead of frying.  Remove the fatty part of meats before cooking.  Steam vegetables in water or broth. Meal planning  At meals, imagine dividing your plate into fourths: ? One-half of your plate is fruits and  vegetables. ? One-fourth of your plate is whole grains. ? One-fourth of your plate is protein, especially lean meats, poultry, eggs, tofu, beans, or nuts.  Include low-fat dairy as part of your daily diet.   Lifestyle  Choose healthy options in all settings, including home, work, school, restaurants, or stores.  Prepare your food safely: ? Wash your hands after handling raw meats. ? Keep food preparation surfaces clean by regularly washing with hot, soapy water. ? Keep raw meats separate from ready-to-eat foods, such as fruits and vegetables. ? Cook seafood, meat, poultry, and eggs to the recommended internal temperature. ? Store foods at safe temperatures. In general:  Keep cold foods at 4F (4.4C) or below.  Keep hot foods at 14F (60C) or above.  Keep your freezer at [facility] (-17.8C) or below.  Foods are no longer safe to eat when they have been between the temperatures of 40-14F (4.4-60C) for more than 2 hours. What foods should I eat? Fruits Aim to eat 2 cup-equivalents of fresh, canned (in natural juice), or frozen fruits each day. Examples of 1 cup-equivalent of fruit  include 1 small apple, 8 large strawberries, 1 cup canned fruit,  cup dried fruit, or 1 cup 100% juice. Vegetables Aim to eat 2-3 cup-equivalents of fresh and frozen vegetables each day, including different varieties and colors. Examples of 1 cup-equivalent of vegetables include 2 medium carrots, 2 cups raw, leafy greens, 1 cup chopped vegetable (raw or cooked), or 1 medium baked potato. Grains Aim to eat 6 ounce-equivalents of whole grains each day. Examples of 1 ounce-equivalent of grains include 1 slice of bread, 1 cup ready-to-eat cereal, 3 cups popcorn, or  cup cooked rice, pasta, or cereal. Meats and other proteins Aim to eat 5-6 ounce-equivalents of protein each day. Examples of 1 ounce-equivalent of protein include 1 egg, 1/2 cup nuts or seeds, or 1 tablespoon (16 g) peanut butter. A cut of meat or  fish that is the size of a deck of cards is about 3-4 ounce-equivalents.  Of the protein you eat each week, try to have at least 8 ounces come from seafood. This includes salmon, trout, herring, and anchovies. Dairy Aim to eat 3 cup-equivalents of fat-free or low-fat dairy each day. Examples of 1 cup-equivalent of dairy include 1 cup (240 mL) milk, 8 ounces (250 g) yogurt, 1 ounces (44 g) natural cheese, or 1 cup (240 mL) fortified soy milk. Fats and oils  Aim for about 5 teaspoons (21 g) per day. Choose monounsaturated fats, such as canola and olive oils, avocados, peanut butter, and most nuts, or polyunsaturated fats, such as sunflower, corn, and soybean oils, walnuts, pine nuts, sesame seeds, sunflower seeds, and flaxseed. Beverages  Aim for six 8-oz glasses of water per day. Limit coffee to three to five 8-oz cups per day.  Limit caffeinated beverages that have added calories, such as soda and energy drinks.  Limit alcohol intake to no more than 1 drink a day for nonpregnant women and 2 drinks a day for men. One drink equals 12 oz of beer (355 mL), 5 oz of wine (148 mL), or 1 oz of hard liquor (44 mL). Seasoning and other foods  Avoid adding excess amounts of salt to your foods. Try flavoring foods with herbs and spices instead of salt.  Avoid adding sugar to foods.  Try using oil-based dressings, sauces, and spreads instead of solid fats. This information is based on general [place] nutrition guidelines. For more information, visit BuildDNA.es. Exact amounts may vary based on your nutrition needs. Summary  A healthy eating plan may help you to maintain a healthy weight, reduce the risk of chronic diseases, and stay active throughout your life.  Plan your meals. Make sure you eat the right portions of a variety of nutrient-rich foods.  Try baking, boiling, grilling, or broiling instead of frying.  Choose healthy options in all settings, including home, work, school,  restaurants, or stores. This information is not intended to replace advice given to you by your health care provider. Make sure you discuss any questions you have with your health care provider. Document Revised: 04/10/2017 Document Reviewed: 04/10/2017 Elsevier Patient Education  [place].

## 2020-09-03 ENCOUNTER — Other Ambulatory Visit: Payer: 59 | Admitting: *Deleted

## 2020-09-03 ENCOUNTER — Other Ambulatory Visit: Payer: Self-pay

## 2020-09-03 DIAGNOSIS — E785 Hyperlipidemia, unspecified: Secondary | ICD-10-CM

## 2020-09-03 DIAGNOSIS — I10 Essential (primary) hypertension: Secondary | ICD-10-CM

## 2020-09-03 DIAGNOSIS — I25700 Atherosclerosis of coronary artery bypass graft(s), unspecified, with unstable angina pectoris: Secondary | ICD-10-CM

## 2020-09-03 LAB — LIPID PANEL
Chol/HDL Ratio: 3.8 ratio (ref 0.0–5.0)
Cholesterol, Total: 134 mg/dL (ref 100–199)
HDL: 35 mg/dL — ABNORMAL LOW (ref >39)
LDL Chol Calc (NIH): 83 mg/dL (ref 0–99)
Triglycerides: 79 mg/dL (ref 0–149)
VLDL Cholesterol Cal: 16 mg/dL (ref 5–40)

## 2020-09-04 ENCOUNTER — Other Ambulatory Visit: Payer: Self-pay

## 2020-09-04 DIAGNOSIS — I25700 Atherosclerosis of coronary artery bypass graft(s), unspecified, with unstable angina pectoris: Secondary | ICD-10-CM

## 2020-09-04 MED ORDER — EZETIMIBE 10 MG PO TABS: 10.0000 mg | ORAL_TABLET | Freq: Every day | ORAL | 3 refills | Status: DC

## 2020-09-23 ENCOUNTER — Ambulatory Visit (INDEPENDENT_AMBULATORY_CARE_PROVIDER_SITE_OTHER): Payer: 59 | Admitting: Internal Medicine

## 2020-09-23 ENCOUNTER — Encounter: Payer: Self-pay | Admitting: Internal Medicine

## 2020-09-23 ENCOUNTER — Other Ambulatory Visit: Payer: Self-pay

## 2020-09-23 VITALS — BP 118/72 | HR 48 | Temp 98.0°F | Ht 68.0 in | Wt 197.4 lb

## 2020-09-23 DIAGNOSIS — I10 Essential (primary) hypertension: Secondary | ICD-10-CM

## 2020-09-23 DIAGNOSIS — Z23 Encounter for immunization: Secondary | ICD-10-CM

## 2020-09-23 DIAGNOSIS — J309 Allergic rhinitis, unspecified: Secondary | ICD-10-CM

## 2020-09-23 DIAGNOSIS — N320 Bladder-neck obstruction: Secondary | ICD-10-CM | POA: Diagnosis not present

## 2020-09-23 DIAGNOSIS — E7800 Pure hypercholesterolemia, unspecified: Secondary | ICD-10-CM | POA: Diagnosis not present

## 2020-09-23 LAB — CBC WITH DIFFERENTIAL/PLATELET
Basophils Absolute: 0.1 10*3/uL (ref 0.0–0.1)
Basophils Relative: 1.1 % (ref 0.0–3.0)
Eosinophils Absolute: 0.1 10*3/uL (ref 0.0–0.7)
Eosinophils Relative: 2.7 % (ref 0.0–5.0)
HCT: 36.7 % — ABNORMAL LOW (ref 39.0–52.0)
Hemoglobin: 12.2 g/dL — ABNORMAL LOW (ref 13.0–17.0)
Lymphocytes Relative: 32.8 % (ref 12.0–46.0)
Lymphs Abs: 1.6 10*3/uL (ref 0.7–4.0)
MCHC: 33.3 g/dL (ref 30.0–36.0)
MCV: 92.4 fl (ref 78.0–100.0)
Monocytes Absolute: 0.4 10*3/uL (ref 0.1–1.0)
Monocytes Relative: 8 % (ref 3.0–12.0)
Neutro Abs: 2.7 10*3/uL (ref 1.4–7.7)
Neutrophils Relative %: 55.4 % (ref 43.0–77.0)
Platelets: 239 10*3/uL (ref 150.0–400.0)
RBC: 3.97 Mil/uL — ABNORMAL LOW (ref 4.22–5.81)
RDW: 13.6 % (ref 11.5–15.5)
WBC: 4.9 10*3/uL (ref 4.0–10.5)

## 2020-09-23 LAB — BASIC METABOLIC PANEL
BUN: 19 mg/dL (ref 6–23)
CO2: 29 mEq/L (ref 19–32)
Calcium: 9.5 mg/dL (ref 8.4–10.5)
Chloride: 99 mEq/L (ref 96–112)
Creatinine, Ser: 1.11 mg/dL (ref 0.40–1.50)
GFR: 69.39 mL/min (ref >60.00)
Glucose, Bld: 108 mg/dL — ABNORMAL HIGH (ref 70–99)
Potassium: 3.7 mEq/L (ref 3.5–5.1)
Sodium: 138 mEq/L (ref 135–145)

## 2020-09-23 LAB — HEPATIC FUNCTION PANEL
ALT: 36 U/L (ref 0–53)
AST: 29 U/L (ref 0–37)
Albumin: 4.7 g/dL (ref 3.5–5.2)
Alkaline Phosphatase: 36 U/L — ABNORMAL LOW (ref 39–117)
Bilirubin, Direct: 0.2 mg/dL (ref 0.0–0.3)
Total Bilirubin: 0.6 mg/dL (ref 0.2–1.2)
Total Protein: 7.4 g/dL (ref 6.0–8.3)

## 2020-09-23 LAB — PSA: PSA: 0.87 ng/mL (ref 0.10–4.00)

## 2020-09-23 LAB — TSH: TSH: 1.02 u[IU]/mL (ref 0.35–5.50)

## 2020-09-23 NOTE — Assessment & Plan Note (Addendum)
Target LDL < 70 since has known CAD > rx per cardiology  Lab Results  Component Value Date   TSH 1.02 09/23/2020     Lab Results  Component Value Date   CHOL 134 09/03/2020   HDL 35 (L) 09/03/2020   LDLCALC 83 09/03/2020   TRIG 79 09/03/2020   CHOLHDL 3.8 09/03/2020    Lab Results  Component Value Date   ALT 36 09/23/2020   AST 29 09/23/2020   ALKPHOS 36 (L) 09/23/2020   BILITOT 0.6 09/23/2020    Adequate control on present rx, reviewed in detail with pt > no change in rx needed

## 2020-09-23 NOTE — Assessment & Plan Note (Addendum)
No eosinophilia > otcs prn          Each maintenance medication was reviewed in detail including emphasizing most importantly the difference between maintenance and prns and under what circumstances the prns are to be triggered using an action plan format where appropriate.  Total time for H and P, chart review, counseling,  and generating customized AVS unique to this office visit / same day charting  = 25 min

## 2020-09-23 NOTE — Progress Notes (Addendum)
Patient ID: [name] [name], male   DOB: 03/25/1955   MRN: [id number]   Brief patient profile:  89   yobm quit smoking in early 1980's with hypertension hyperlipidemia and atypical chest pain with a left heart catheter showing 60% obstruction of the right-sided branches in the 1999 and a negative stress study in May of 2007> cabg required 10/2010    History of Present Illness  08/12/2010 ov/[name] [name]  Cc new onset cp comes and goes x 1 week so went to ER July 30th with neg w/u and much less cp since er.  Location = Left of sternum only, aching but   No relation to activity, lasts no more than a few seconds, not noticeable supine or sleeping.  Does ok with exercise with no pains. rec Cardiac eval    DATE OF ADMISSION: 10/18/2010  DATE OF DISCHARGE: 10/24/2010  DISCHARGE SUMMARY  PRIMARY ADMITTING DIAGNOSIS: Chest pain.  ADDITIONAL/DISCHARGE DIAGNOSES:  1. Coronary artery disease.  2. New-onset angina.  3. Hypertension.  4. Hyperlipidemia.  5. Remote history of tobacco abuse.  6. Postoperative acute blood loss anemia.  PROCEDURES PERFORMED:  1. Cardiac catheterization.  2. Coronary artery bypass grafting x4 (left internal mammary artery to  the LAD, saphenous vein graft to the second diagonal, saphenous  vein graft to the first obtuse marginal, saphenous vein graft to  the posterior descending).  3. Endoscopic vein harvest, right leg     10/24/16  Back injury at home leaning over to get something out of car > midline low back pain>  Ortho > shots > no more need for flex/advil     04/05/2017 acute  ov/[name] [name] re: new back pain in pt with hbp Chief Complaint  Patient presents with   Acute Visit    shrap pain in left side for 1 week now   new left sided back pain acute onset x  two weeks prior to OV  When bending over to reach for luggage  Positional /better on lying down or sitting still  Started advil 3 x day some better No radicular features/ rash/ pain with cough or change with  urination /bms Other back pain better p injection of pain at ortho office  rec Tdap today  flexoril 10 mg up to 4 x daily as needed for muscle spasm Advil 3-4 with meals as needed for pain (= up to 12 per day)  Heating pad may help Call your orthopedic doctor for follow up     08/10/17  Neg Myoview    03/23/2020  f/u ov/[name] [name] re: hbp, ihd/ hyperlidemia Chief Complaint  Patient presents with   Follow-up    No issues at this time  Dyspnea:  Not much ex at all  Cough: none Sleeping: no resp symptoms SABA use: none  02: none  Covid status:   vax x 3  Rec No change rx    09/23/2020  f/u ov/[name] [name] re:  hbp, ihd/ hyperlidemia    Chief Complaint  Patient presents with   Follow-up    Pt states no concerns.  Dyspnea:  limited by joints but doing power walk 45 min tiw Cough: none Sleeping: no resp problems  SABA use: none  02: none  Covid status:   vax x 4 / never infected   No obvious day to day or daytime variability or assoc excess/ purulent sputum or mucus plugs or hemoptysis or cp or chest tightness, subjective wheeze or overt sinus or hb symptoms.   Sleeping  without nocturnal  or early am exacerbation  of respiratory  c/o's or need for noct saba. Also denies any obvious fluctuation of symptoms with weather or environmental changes or other aggravating or alleviating factors except as outlined above   No unusual exposure hx or h/o childhood pna/ asthma or knowledge of premature birth.  Current Allergies, Complete Past Medical History, Past Surgical History, Family History, and Social History were reviewed in [employer] record.  ROS  The following are not active complaints unless bolded Hoarseness, sore throat, dysphagia, dental problems, itching, sneezing,  nasal congestion or discharge of excess mucus or purulent secretions, ear ache,   fever, chills, sweats,  intended wt loss or wt gain, classically pleuritic or exertional cp,  orthopnea pnd or arm/hand  swelling  or leg swelling, presyncope, palpitations, abdominal pain, anorexia, nausea, vomiting, diarrhea  or change in bowel habits or change in bladder habits, change in stools or change in urine, dysuria, hematuria,  rash, arthralgias, visual complaints, headache, numbness, weakness or ataxia or problems with walking or coordination,  change in mood or  memory.        Current Meds  Medication Sig   acetaminophen (TYLENOL) 650 MG CR tablet Take 650 mg by mouth every 8 (eight) hours as needed for pain.   aspirin EC 81 MG tablet Take 1 tablet (81 mg total) by mouth daily.   Camphor-Menthol-Methyl Sal (SALONPAS) 3.01-16-08 % PTCH Apply 1 patch topically daily as needed (back pain).   clotrimazole (LOTRIMIN) 1 % cream Apply 1 application topically 2 (two) times daily.   ezetimibe (ZETIA) 10 MG tablet Take 1 tablet (10 mg total) by mouth daily.   losartan-hydrochlorothiazide (HYZAAR) 100-25 MG tablet Take 1 tablet by mouth daily.   meclizine (ANTIVERT) 25 MG tablet Take 1 tablet (25 mg total) by mouth 3 (three) times daily as needed for dizziness.   Menthol, Topical Analgesic, (BENGAY EX) Apply 1 application topically daily as needed (back pain).   metoprolol tartrate (LOPRESSOR) 25 MG tablet Take 1 tablet (25 mg total) by mouth 2 (two) times daily.   nitroGLYCERIN (NITROSTAT) 0.4 MG SL tablet Place 1 tablet (0.4 mg total) under the tongue every 5 (five) minutes as needed for chest pain.              Past Medical History from [facility]: S/p THR on L Blackman and Erskine Emery PA  HYPERTENSION, BENIGN ESSENTIAL, CONTROLLED (ICD-401.1)  OBESITY  - Target wt = 190 for BMI < 30  Colon polyps..............................................................................................  - colonoscopy 4/04, letter sent 05/15/07 reviewed with pt July 17, 2008 and August 03, 2009 > referred back to GI  CHEST PAIN (ICD-786.50)  ....................................................................................  [name] [name] - s/p cabg 10/19/2010 HYPERLIPIDEMIA  - target LDL less than 70 based on documented ischemic heart disease  HEALTH MAINTENANCE..........................................................................................[name]  - TD 04/05/2017  - Prevnar 13 09/05/2018  - CPX   09/23/2020      Family History:  hypertension in mother and father no premature heart disease in any direct relatives nor stroke diabetes or cancer, except that his mother has developed leukemia  1 brother healthy  Neg prostate ca     Social History:  quit smoking about 1983 denies alcohol use.  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           Objective:   Physical Exam   09/23/2020   197  03/23/2020    210  09/23/2019  209 03/15/2019     201 11/12/2018   201  09/05/2018  197  wt 201 November 20, 2007 > 205 April 18, 2008 > 184 01/03/2011 > 182 04/04/2011 > 07/04/2011  185  >189 09/19/2011 > 01/02/2012 194 >204 02/09/2012 >196 04/04/2012 > 04/25/2012 194 > 11/02/2012  193 > 01/19/2013  190 >  05/23/2014  193 > 05/25/2015 196 > 09/02/2016   200   > 04/05/2017   207 > 05/18/2017  99% RA> 09/04/2017 203>  03/08/2018   204    Vital signs reviewed  09/23/2020  - Note at rest 02 sats  97% on RA   General appearance:    amb bm nad   HEENT : pt wearing mask not removed for exam due to covid -19 concerns.    NECK :  without JVD/Nodes/TM/ nl carotid upstrokes bilaterally   LUNGS: no acc muscle use,  Nl contour chest which is clear to A and P bilaterally without cough on insp or exp maneuvers   CV:  RRR  no s3 or murmur or increase in P2, and no edema  Post tibial stronger on R than L and reduced DP bilaterally   ABD:  soft and nontender with nl inspiratory excursion in the supine position. No bruits or organomegaly appreciated, bowel sounds nl  MS:  Nl gait/ ext warm without deformities, calf tenderness, cyanosis or clubbing No obvious joint  restrictions   SKIN: warm and dry without lesions    NEURO:  alert, approp, nl sensorium with  no motor or cerebellar deficits apparent.   GU  uncirc, nl testes, nl prostate, stool g neg    Labs ordered/ reviewed:      Chemistry      Component Value Date/Time   NA 138 09/23/2020 0917   NA 140 08/08/2017 0901   K 3.7 09/23/2020 0917   CL 99 09/23/2020 0917   CO2 29 09/23/2020 0917   BUN 19 09/23/2020 0917   BUN 21 08/08/2017 0901   CREATININE 1.11 09/23/2020 0917      Component Value Date/Time   CALCIUM 9.5 09/23/2020 0917   ALKPHOS 36 (L) 09/23/2020 0917   AST 29 09/23/2020 0917   ALT 36 09/23/2020 0917   BILITOT 0.6 09/23/2020 0917   BILITOT 0.3 08/08/2017 0901        Lab Results  Component Value Date   WBC 4.9 09/23/2020   HGB 12.2 (L) 09/23/2020   HCT 36.7 (L) 09/23/2020   MCV 92.4 09/23/2020   PLT 239.0 09/23/2020     No results found for: DDIMER    Lab Results  Component Value Date   TSH 1.02 09/23/2020        Assessment:

## 2020-09-23 NOTE — Assessment & Plan Note (Signed)
Lab Results  Component Value Date   CREATININE 1.11 09/23/2020   CREATININE 1.06 03/23/2020   CREATININE 0.90 09/14/2019      Adequate control on present rx, reviewed in detail with pt > no change in rx needed

## 2020-09-23 NOTE — Patient Instructions (Addendum)
Flu shot today and I do recommend the new booster for COVID 19   No change in your medications  Please remember to go to the lab department   for your tests - we will call you with the results when they are available.  Please schedule a follow up visit in 6 months but call sooner if needed

## 2020-09-23 NOTE — Assessment & Plan Note (Signed)
No flow issues at this point, nl exam and PSA

## 2020-12-08 ENCOUNTER — Other Ambulatory Visit: Payer: Self-pay | Admitting: Internal Medicine

## 2020-12-08 DIAGNOSIS — B356 Tinea cruris: Secondary | ICD-10-CM

## 2020-12-10 ENCOUNTER — Other Ambulatory Visit: Payer: Self-pay

## 2020-12-10 ENCOUNTER — Other Ambulatory Visit: Payer: 59

## 2020-12-10 DIAGNOSIS — I25700 Atherosclerosis of coronary artery bypass graft(s), unspecified, with unstable angina pectoris: Secondary | ICD-10-CM

## 2020-12-10 LAB — LIPID PANEL
Chol/HDL Ratio: 2.7 ratio (ref 0.0–5.0)
Cholesterol, Total: 109 mg/dL (ref 100–199)
HDL: 41 mg/dL (ref >39)
LDL Chol Calc (NIH): 53 mg/dL (ref 0–99)
Triglycerides: 68 mg/dL (ref 0–149)
VLDL Cholesterol Cal: 15 mg/dL (ref 5–40)

## 2020-12-14 ENCOUNTER — Telehealth: Payer: Self-pay | Admitting: Physician Assistant

## 2020-12-14 NOTE — Telephone Encounter (Signed)
LDL is 53  Does he still need to go to lipid clinic?

## 2020-12-14 NOTE — Telephone Encounter (Signed)
LDL still 83-didn't change much from 85. Ask him if he is taking pravachol 80 mg and zetia 10 mg. If he is please refer to lipid clinic. thanks

## 2020-12-14 NOTE — Telephone Encounter (Signed)
Pt aware ./cy 

## 2020-12-14 NOTE — Telephone Encounter (Signed)
Patient was returning call from Friday to discuss lab results

## 2021-03-09 ENCOUNTER — Other Ambulatory Visit: Payer: Self-pay | Admitting: Internal Medicine

## 2021-03-10 ENCOUNTER — Telehealth: Payer: Self-pay | Admitting: Internal Medicine

## 2021-03-10 MED ORDER — METOPROLOL TARTRATE 25 MG PO TABS: 25.0000 mg | ORAL_TABLET | Freq: Two times a day (BID) | ORAL | 3 refills | Status: DC

## 2021-03-10 NOTE — Telephone Encounter (Signed)
Patient states correct pharmacy is [facility]. Patient phone number is [phone]. ?

## 2021-03-10 NOTE — Telephone Encounter (Signed)
Called to let patient know that refill of Metoprolol was sent into [facility] not [facility]. Pt verified understanding nothing further ?

## 2021-03-17 ENCOUNTER — Encounter: Payer: Self-pay | Admitting: Internal Medicine

## 2021-03-17 ENCOUNTER — Ambulatory Visit (INDEPENDENT_AMBULATORY_CARE_PROVIDER_SITE_OTHER): Payer: 59 | Admitting: Internal Medicine

## 2021-03-17 ENCOUNTER — Other Ambulatory Visit: Payer: Self-pay

## 2021-03-17 DIAGNOSIS — E7800 Pure hypercholesterolemia, unspecified: Secondary | ICD-10-CM | POA: Diagnosis not present

## 2021-03-17 DIAGNOSIS — I10 Essential (primary) hypertension: Secondary | ICD-10-CM | POA: Diagnosis not present

## 2021-03-17 DIAGNOSIS — M25552 Pain in left hip: Secondary | ICD-10-CM

## 2021-03-17 DIAGNOSIS — Z0000 Encounter for general adult medical examination without abnormal findings: Secondary | ICD-10-CM

## 2021-03-17 NOTE — Patient Instructions (Signed)
I will be referring you to Internal medicine  - call me in meantime if needed  ? ?No change in medications  ?

## 2021-03-17 NOTE — Assessment & Plan Note (Signed)
Referred to ortho 07/04/2011  ?- referred again  04/25/12  ?- Status post total replacement of left hip 05/29/15  Dr Rush Farmer  ? ?F/u Blackmon also for chronic non-radicular low back pain  ?

## 2021-03-17 NOTE — Progress Notes (Signed)
Patient ID: [name] [name], male   DOB: 16-Jul-1955   MRN: [id number] ? ? ?Brief patient profile:  ?71   yobm quit smoking in early 1980's with hypertension hyperlipidemia and atypical chest pain with a left heart catheter showing 60% obstruction of the right-sided branches in the 1999 and a negative stress study in May of 2007> cabg required 10/2010 ? ?  ?History of Present Illness  ?08/12/2010 ov/[name] [name]  Cc new onset cp comes and goes x 1 week so went to ER July 30th with neg w/u and much less cp since er.  Location = Left of sternum only, aching but   No relation to activity, lasts no more than a few seconds, not noticeable supine or sleeping.  Does ok with exercise with no pains. ?rec ?Cardiac eval  ? ? ?DATE OF ADMISSION: 10/18/2010  ?DATE OF DISCHARGE: 10/24/2010  ?DISCHARGE SUMMARY  ?PRIMARY ADMITTING DIAGNOSIS: Chest pain.  ?ADDITIONAL/DISCHARGE DIAGNOSES:  ?1. Coronary artery disease.  ?2. New-onset angina.  ?3. Hypertension.  ?4. Hyperlipidemia.  ?5. Remote history of tobacco abuse.  ?6. Postoperative acute blood loss anemia.  ?PROCEDURES PERFORMED:  ?1. Cardiac catheterization.  ?2. Coronary artery bypass grafting x4 (left internal mammary artery to  ?the LAD, saphenous vein graft to the second diagonal, saphenous  ?vein graft to the first obtuse marginal, saphenous vein graft to  ?the posterior descending).  ?3. Endoscopic vein harvest, right leg ? ?  ? ?10/24/16  Back injury at home leaning over to get something out of car > midline low back pain>  Ortho > shots > no more need for flex/advil  ? ? ? ?04/05/2017 acute  ov/[name] [name] re: new back pain in pt with hbp ?Chief Complaint  ?Patient presents with  ? Acute Visit  ?  shrap pain in left side for 1 week now   ?new left sided back pain acute onset x  two weeks prior to OV  When bending over to reach for luggage  ?Positional /better on lying down or sitting still  ?Started advil 3 x day some better ?No radicular features/ rash/ pain with cough or change with  urination /bms ?Other back pain better p injection of pain at ortho office  ?rec ?Tdap today  ?flexoril 10 mg up to 4 x daily as needed for muscle spasm ?Advil 3-4 with meals as needed for pain (= up to 12 per day)  ?Heating pad may help ?Call your orthopedic doctor for follow up  ? ? ? 08/10/17  Neg Myoview ? ? ?09/23/2020  f/u ov/[name] [name] re:  hbp, ihd/ hyperlidemia    ?Chief Complaint  ?Patient presents with  ? Follow-up  ?  Pt states no concerns.  ?Dyspnea:  limited by joints but doing power walk 45 min tiw ?Cough: none ?Sleeping: no resp problems  ?SABA use: none  ?02: none  ?Covid status:   vax x 4 / never infected ?Rec ?No change in your medications ? ? ?03/17/2021  f/u ov/[name] [name] re: hbp/ihd/hyperlipidemia   ?Chief Complaint  ?Patient presents with  ? Follow-up  ?  Doing good  ?Dyspnea:  limited by back more than breathing so no regular aerobics  ?Cough: no cough  ?Sleeping: no resp cc ?SABA use: none  ?02: none  ?  ?No obvious day to day or daytime variability or assoc excess/ purulent sputum or mucus plugs or hemoptysis or cp or chest tightness, subjective wheeze or overt sinus or hb symptoms.  ? ?Sleeping  without nocturnal  or early am  exacerbation  of respiratory  c/o's or need for noct saba. Also denies any obvious fluctuation of symptoms with weather or environmental changes or other aggravating or alleviating factors except as outlined above  ? ?No unusual exposure hx or h/o childhood pna/ asthma or knowledge of premature birth. ? ?Current Allergies, Complete Past Medical History, Past Surgical History, Family History, and Social History were reviewed in [facility] record. ? ?ROS  The following are not active complaints unless bolded ?Hoarseness, sore throat, dysphagia, dental problems, itching, sneezing,  nasal congestion or discharge of excess mucus or purulent secretions, ear ache,   fever, chills, sweats, unintended wt loss or wt gain, classically pleuritic or exertional cp,   orthopnea pnd or arm/hand swelling  or leg swelling, presyncope, palpitations, abdominal pain, anorexia, nausea, vomiting, diarrhea  or change in bowel habits or change in bladder habits, change in stools or change in urine, dysuria, hematuria,  rash, arthralgias, visual complaints, headache, numbness, weakness or ataxia or problems with walking or coordination,  change in mood or  memory. ?      ? ?Current Meds  ?Medication Sig  ? acetaminophen (TYLENOL) 650 MG CR tablet Take 650 mg by mouth every 8 (eight) hours as needed for pain.  ? aspirin EC 81 MG tablet Take 1 tablet (81 mg total) by mouth daily.  ? Camphor-Menthol-Methyl Sal (SALONPAS) 3.01-16-08 % PTCH Apply 1 patch topically daily as needed (back pain).  ? clotrimazole (LOTRIMIN) 1 % cream APPLY TO AFFECTED AREA(S) TWO TIMES A DAY  ? losartan-hydrochlorothiazide (HYZAAR) 100-25 MG tablet Take 1 tablet by mouth daily.  ? meclizine (ANTIVERT) 25 MG tablet Take 1 tablet (25 mg total) by mouth 3 (three) times daily as needed for dizziness.  ? Menthol, Topical Analgesic, (BENGAY EX) Apply 1 application topically daily as needed (back pain).  ? metoprolol tartrate (LOPRESSOR) 25 MG tablet Take 1 tablet (25 mg total) by mouth 2 (two) times daily.  ? nitroGLYCERIN (NITROSTAT) 0.4 MG SL tablet Place 1 tablet (0.4 mg total) under the tongue every 5 (five) minutes as needed for chest pain.  ?    ? ?  ? ? ?  ?Past Medical History from [facility]: ?S/p THR on L Blackman and Erskine Emery PA  ?HYPERTENSION, BENIGN ESSENTIAL, CONTROLLED (ICD-401.1)  ?OBESITY  ?- Target wt = 190 for BMI < 30  ?Colon polyps..............................................................................................  ?- colonoscopy 4/04, letter sent 05/15/07 reviewed with pt July 17, 2008 and August 03, 2009 > referred back to GI  ?CHEST PAIN (ICD-786.50) ....................................................................................  [name] [name] ?- s/p cabg 10/19/2010 ?HYPERLIPIDEMIA  ?- target  LDL less than 70 based on documented ischemic heart disease  ?Nephrolithiasis ..........................................................................................................Marland KitchenWrenn  ?HEALTH MAINTENANCE..........................................................................................[name]  ?- TD 04/05/2017  ?- Prevnar 13 09/05/2018  ?- CPX   09/23/2020  ? ? ? ? ?Family History:  ?hypertension in mother and father no premature heart disease in any direct relatives nor stroke diabetes or cancer, except that his mother has developed leukemia  ?1 brother healthy  ?Neg prostate ca  ? ? ? ?Social History:  ?quit smoking about 1983 denies alcohol use.  ?Works at a [employer]  ?  ?   ? ?   ?Objective:  ? Physical Exam ? ?Wts ? ?03/17/2021      202  ? 09/23/2020   197  ?03/23/2020    210 ? 09/23/2019  209 ?03/15/2019     201 ?11/12/2018   201  ?09/05/2018  197  ?wt 201 November 20, 2007 >  205 April 18, 2008 > 184 01/03/2011 > 182 04/04/2011 > 07/04/2011  185  >189 09/19/2011 > 01/02/2012 194 >204 02/09/2012 >196 04/04/2012 > 04/25/2012 194 > 11/02/2012  193 > 01/19/2013  190 >  05/23/2014  193 > 05/25/2015 196 > 09/02/2016   200   > 04/05/2017   207 > 05/18/2017  99% RA> 09/04/2017 203>  03/08/2018   204  ? ? Vital signs reviewed  03/17/2021  - Note at rest 02 sats  100% on RA  ? ?General appearance:    amb wm  ? ? HEENT : pt wearing mask not removed for exam due to covid -19 concerns.  ? ? ?NECK :  without JVD/Nodes/TM/ nl carotid upstrokes bilaterally ? ? ?LUNGS: no acc muscle use,  Nl contour chest which is clear to A and P bilaterally without cough on insp or exp maneuvers ? ? ?CV:  RRR  no s3 or murmur or increase in P2, and no edema  ? ?ABD:  soft and nontender with nl inspiratory excursion in the supine position. No bruits or organomegaly appreciated, bowel sounds nl ? ?MS:  Nl gait/ ext warm without deformities, calf tenderness, cyanosis or clubbing ?No obvious joint restrictions  ? ?SKIN: warm and dry without lesions    ? ?NEURO:  alert, approp, nl sensorium with  no motor or cerebellar deficits apparent.  ? ? ? ? ? ? ?  ?  ? ?  ?  ? ?Assessment:  ?   ? ?    ?   ? ? ?

## 2021-03-17 NOTE — Assessment & Plan Note (Signed)
Target LDL < 70 since has known CAD > rx per cardiology ? ?Lab Results  ?Component Value Date  ? CHOL 109 12/10/2020  ? HDL 41 12/10/2020  ? [place] 53 12/10/2020  ? TRIG 68 12/10/2020  ? CHOLHDL 2.7 12/10/2020  ?  ? ?No change rx  ?

## 2021-03-17 NOTE — Assessment & Plan Note (Signed)
Referred to [facility] for PCP  ? ? ?    ?  ? ?Each maintenance medication was reviewed in detail including emphasizing most importantly the difference between maintenance and prns and under what circumstances the prns are to be triggered using an action plan format where appropriate. ? ?Total time for H and P, chart review, counseling,  and generating customized AVS unique to this summary final  office visit / same day charting = 25 min  ?     ?

## 2021-03-17 NOTE — Assessment & Plan Note (Addendum)
. ?  Lab Results  ?Component Value Date  ? CREATININE 1.11 09/23/2020  ? CREATININE 1.06 03/23/2020  ? CREATININE 0.90 09/14/2019  ? ? ? ?Adequate control on present rx, reviewed in detail with pt > no change in rx needed   ?>>> referred to [facility] for PCP ? ? ?

## 2021-03-23 ENCOUNTER — Ambulatory Visit: Payer: 59 | Admitting: Internal Medicine

## 2021-03-23 ENCOUNTER — Encounter: Payer: Self-pay | Admitting: Physician Assistant

## 2021-03-23 ENCOUNTER — Ambulatory Visit (INDEPENDENT_AMBULATORY_CARE_PROVIDER_SITE_OTHER): Payer: 59 | Admitting: Physician Assistant

## 2021-03-23 DIAGNOSIS — M545 Low back pain, unspecified: Secondary | ICD-10-CM | POA: Diagnosis not present

## 2021-03-23 MED ORDER — CYCLOBENZAPRINE HCL 10 MG PO TABS: 10.0000 mg | ORAL_TABLET | Freq: Three times a day (TID) | ORAL | 0 refills | Status: DC | PRN

## 2021-03-23 MED ORDER — METHYLPREDNISOLONE 4 MG PO TABS: ORAL_TABLET | ORAL | 0 refills | Status: DC

## 2021-03-23 NOTE — Progress Notes (Signed)
? ?Office Visit Note ?  ?Patient: [name] [name]           ?Date of Birth: 1955-08-28           ?MRN: [id number] ?Visit Date: 03/23/2021 ?             ?Requested by: Tanda Rockers, MD ?[address] ?[address] ?[place],  [place] [address] ?PCP: Tanda Rockers, MD ? ? ?Assessment & Plan: ?Visit Diagnoses:  ?1. Acute midline low back pain without sciatica   ? ? ?Plan: Given his more frequent and sudden onset of back pain despite conservative measures which have included medications and continued home exercise program recommend MRI.  We will have him follow-up after the MRI to go over results discuss further treatment.  The interim can place him on a Medrol Dosepak no NSAIDs while on the prednisone.  Continue Flexeril new prescription was given.  Questions were encouraged and answered at length. ? ?Follow-Up Instructions: Return After MRI.  ? ?Orders:  ?No orders of the defined types were placed in this encounter. ? ?Meds ordered this encounter  ?Medications  ? methylPREDNISolone (MEDROL) 4 MG tablet  ?  Sig: Take as directed  ?  Dispense:  21 tablet  ?  Refill:  0  ? cyclobenzaprine (FLEXERIL) 10 MG tablet  ?  Sig: Take 1 tablet (10 mg total) by mouth 3 (three) times daily as needed for muscle spasms.  ?  Dispense:  30 tablet  ?  Refill:  0  ? ? ? ? Procedures: ?No procedures performed ? ? ?Clinical Data: ?No additional findings. ? ? ?Subjective: ? ?HPI ?[name] [name] comes in today for low back pain.  We have seen him over the years for low back pain.  Last seen 07/25/2019.  He stated she has had on and off low back pain since 2021 and it is becoming more frequent and more sudden onset.  He has had devious MRI in 2018.  Which showed central disc protrusion at L5-S1 which closely approximated the S1 nerve root.  Right subarticular disc extrusion at L4-5 with which resulting in impingement of the right L5 nerve root. ?He is having no radicular symptoms down either leg.  He denies any waking pain bowel bladder dysfunction  saddle anesthesia.  He is not diabetic.  He is tried ibuprofen muscle relaxer and Bengay.  He is also has been taking Tylenol before bed so that he does not awaken due to the back pain.  Most recent back pain started on Sunday after he bent over to unplug a vacuum.  He continues to do his home exercise program as taught by therapy 3 times a week. ? ?Review of Systems  ?Constitutional:  Negative for chills and fever.  ?Musculoskeletal:  Positive for back pain.  ? ? ?Objective: ?Vital Signs: There were no vitals taken for this visit. ? ?Physical Exam ?Constitutional:   ?   Appearance: He is normal weight. He is not ill-appearing or diaphoretic.  ?Pulmonary:  ?   Effort: Pulmonary effort is normal.  ?Neurological:  ?   Mental Status: He is alert and oriented to person, place, and time.  ?Psychiatric:     ?   Mood and Affect: Mood normal.  ? ? ?Ortho Exam ?Positive straight leg raise bilaterally.  5 out of 5 strength throughout the lower extremities against resistance.  Good range of motion bilateral hips. ? ?Specialty Comments:  ?No specialty comments available. ? ?Imaging: ?No results found. ? ? ?  PMFS History: ?Patient Active Problem List  ? Diagnosis Date Noted  ? Anxiety disorder 03/10/2018  ? Acute low back pain without sciatica 10/31/2016  ? Status post total replacement of left hip 05/29/2015  ? Left carotid bruit 11/14/2012  ? Health care maintenance 04/25/2012  ? Hip pain, left 04/06/2011  ? Hx of CABG 11/05/2010  ? CAD (coronary artery disease)   ? COLONIC POLYPS 05/26/2007  ? Essential hypertension 05/26/2007  ? Chronic ischemic heart disease 05/26/2007  ? HEMORRHOIDS, INTERNAL 05/26/2007  ? Allergic rhinitis 05/26/2007  ? CONSTIPATION, CHRONIC 05/26/2007  ? GASTROINTESTINAL HEMORRHAGE, HX OF 05/26/2007  ? HYPERCHOLESTEROLEMIA 05/09/2007  ? ?Past Medical History:  ?Diagnosis Date  ? Arthritis   ? Blood clot in vein   ? superficial / right leg following CABG  ? CAD (coronary artery disease)   ? Chest pain   ?  Colon polyps   ? Complication of anesthesia   ? hard to wake up  ? Essential hypertension, benign   ? Headache   ? migraines as teenager  ? Heart murmur   ? History of blood transfusion   ? History of kidney stones   ? Hyperlipemia   ? Obesity   ? Tobacco abuse   ?  ?Family History  ?Problem Relation Age of Onset  ? Hypertension Father   ? Leukemia Mother   ? Hypertension Mother   ? Colon cancer Neg Hx   ?  ?Past Surgical History:  ?Procedure Laterality Date  ? CARDIAC CATHETERIZATION    ? CIRCUMCISION  1986  ? CORONARY ARTERY BYPASS GRAFT    ? Coronary artery bypass grafting x4  10/21/2010  ? CYSTOSCOPY WITH RETROGRADE PYELOGRAM, URETEROSCOPY AND STENT PLACEMENT Left 09/01/2014  ? Procedure: CYSTOSCOPY WITH RETROGRADE PYELOGRAM, URETEROSCOPY AND STENT PLACEMENT;  Surgeon: Irine Seal, MD;  Location: [facility];  Service: Urology;  Laterality: Left;  ? CYSTOSCOPY WITH URETEROSCOPY AND STENT PLACEMENT Left 09/09/2014  ? Procedure: CYSTOSCOPY,LEFT URETEROSCOPY AND STENT PLACEMENT;  Surgeon: Irine Seal, MD;  Location: [facility];  Service: Urology;  Laterality: Left;  ? HEMORRHOID SURGERY    ? HOLMIUM LASER APPLICATION Left 04/17/6806  ? Procedure: WITH HOLMIUM LASER ;  Surgeon: Irine Seal, MD;  Location: [facility];  Service: Urology;  Laterality: Left;  ? Stapled hemorrhoidopexy.  10/19/2006  ? TOTAL HIP ARTHROPLASTY Left 05/29/2015  ? Procedure: LEFT TOTAL HIP ARTHROPLASTY ANTERIOR APPROACH;  Surgeon: Mcarthur Rossetti, MD;  Location: [facility];  Service: Orthopedics;  Laterality: Left;  ? VASECTOMY  1986  ? ?Social History  ? ?Occupational History  ? Occupation: [employer]  ?Tobacco Use  ? Smoking status: Former  ?  Packs/day: 0.50  ?  Years: 7.00  ?  Pack years: 3.50  ?  Types: Cigarettes  ?  Quit date: 01/10/1981  ?  Years since quitting: 40.2  ? Smokeless tobacco: Never  ?Vaping Use  ? Vaping Use: Never used  ?Substance and Sexual Activity  ? Alcohol use: No  ?  Alcohol/week: 0.0 standard drinks  ? Drug use: No  ? Sexual  activity: Not on file  ? ? ? ? ? ? ?

## 2021-03-23 NOTE — Addendum Note (Signed)
Addended by: Robyne Peers on: 03/23/2021 01:55 PM ? ? Modules accepted: Orders ? ?

## 2021-03-31 ENCOUNTER — Telehealth: Payer: Self-pay

## 2021-03-31 ENCOUNTER — Telehealth: Payer: Self-pay | Admitting: Physician Assistant

## 2021-03-31 NOTE — Telephone Encounter (Signed)
Called pt 1X and left vm for MRI review appt after 3/30 with PA Clark or Dr. Ninfa Linden ?

## 2021-03-31 NOTE — Telephone Encounter (Signed)
Called pt, LVM to call back and scheduled his New Pt appt

## 2021-03-31 NOTE — Telephone Encounter (Signed)
Dr. Melvyn Novas is a lung doctor so this is not a transfer of care. He can schedule new pt visit I am taking new pts. ?

## 2021-03-31 NOTE — Telephone Encounter (Signed)
Pt is calling to Transfer care due to Dr. Melvyn Novas retiring. Dr. Melvyn Novas sent a referral to transfer here. Pt is interested in become a pt of Dr. Sharlet Salina. I advised pt upon approval I would call him back to get him scheduled. ? ?Please advise. ?

## 2021-04-08 ENCOUNTER — Ambulatory Visit
Admission: RE | Admit: 2021-04-08 | Discharge: 2021-04-08 | Disposition: A | Discharge status: Home / Self Care | Payer: 59 | Source: Ambulatory Visit | Attending: Physician Assistant | Admitting: Physician Assistant

## 2021-04-08 DIAGNOSIS — M545 Low back pain, unspecified: Secondary | ICD-10-CM

## 2021-04-12 ENCOUNTER — Encounter: Payer: Self-pay | Admitting: Physician Assistant

## 2021-04-12 ENCOUNTER — Ambulatory Visit (INDEPENDENT_AMBULATORY_CARE_PROVIDER_SITE_OTHER): Payer: 59 | Admitting: Physician Assistant

## 2021-04-12 DIAGNOSIS — M545 Low back pain, unspecified: Secondary | ICD-10-CM

## 2021-04-12 NOTE — Addendum Note (Signed)
Addended by: Robyne Peers on: 04/12/2021 01:39 PM ? ? Modules accepted: Orders ? ?

## 2021-04-12 NOTE — Progress Notes (Signed)
Mr. Feasel returns today to go over the MRI of his lumbar spine.  He continues to have low back pain without any radicular symptoms.  States pain to be 6-7 out of 10 pain.  Does not seem to be associated with position.  It does awaken him at times if he does not take Tylenol before going to bed.  He had prior back pain and underwent epidural steroid injections in the past which have helped.  He states he has had no change in overall symptoms. ? ?MRI lumbar spine is reviewed with the patient.  MRI shows L4-5 disc herniation to be overall improved in regards to size and its impingement on the spinal canal.  Right lateral recess stenosis remains at L4-5 with impingement upon the L5 nerve root.  Disc and facet degeneration at L3-4 with mild recess and neural foraminal stenosis. ? ?Review of systems see HPI otherwise negative ? ?Physical exam: General well-developed well-nourished male ambulates without any assistive devices nonantalgic gait. ?Lower lumbar spine nontender over the lumbar spine paraspinous region.  Negative straight leg raise bilaterally. ? ?Impression: Low back pain without radicular symptoms ? ?Plan: ?Recommend repeat epidural steroid injection at L4-5.  He will continue to use his back exercises.  Follow-up with [place] as needed pain persist or becomes worse.  Questions were encouraged and answered at length. ? ? ?

## 2021-05-17 ENCOUNTER — Encounter: Payer: Self-pay | Admitting: Physical Medicine and Rehabilitation

## 2021-05-17 ENCOUNTER — Ambulatory Visit: Payer: Self-pay

## 2021-05-17 ENCOUNTER — Ambulatory Visit (INDEPENDENT_AMBULATORY_CARE_PROVIDER_SITE_OTHER): Payer: 59 | Admitting: Physical Medicine and Rehabilitation

## 2021-05-17 VITALS — BP 134/82 | HR 63

## 2021-05-17 DIAGNOSIS — M5416 Radiculopathy, lumbar region: Secondary | ICD-10-CM

## 2021-05-17 MED ORDER — METHYLPREDNISOLONE ACETATE 80 MG/ML IJ SUSP
80.0000 mg | Freq: Once | INTRAMUSCULAR | Status: AC
  Administered 2021-05-17: 80 mg

## 2021-05-17 NOTE — Patient Instructions (Signed)

## 2021-05-30 ENCOUNTER — Other Ambulatory Visit: Payer: Self-pay | Admitting: Internal Medicine

## 2021-06-01 ENCOUNTER — Other Ambulatory Visit: Payer: Self-pay | Admitting: Physical Medicine and Rehabilitation

## 2021-06-01 ENCOUNTER — Telehealth: Payer: Self-pay | Admitting: Physical Medicine and Rehabilitation

## 2021-06-01 DIAGNOSIS — M5416 Radiculopathy, lumbar region: Secondary | ICD-10-CM

## 2021-06-01 NOTE — Progress Notes (Signed)
Spoke with patient via telephone this afternoon, reports some relief of pain for 2 days following right L4-L5 interlaminar epidural steroid injection on 5/8, we believe the next step is to perform a diagnostic and hopefully therapeutic right L5 transforaminal epidural steroid injection under fluoroscopic guidance. I will place order, patient will be called to schedule.

## 2021-06-01 NOTE — Telephone Encounter (Signed)
Pt called asking for a return call from dr. Ernestina Patches. Pt states he had an back injection that lasted for a new days and would like to discuss next steps. Please call pt at [phone].

## 2021-06-02 NOTE — Procedures (Signed)
Lumbar Epidural Steroid Injection - Interlaminar Approach with Fluoroscopic Guidance  Patient: [name] [name]      Date of Birth: 11/07/1955 MRN: [id number] PCP: Tanda Rockers, MD      Visit Date: 05/17/2021   Universal Protocol:     Consent Given By: the patient  Position: PRONE  Additional Comments: Vital signs were monitored before and after the procedure. Patient was prepped and draped in the usual sterile fashion. The correct patient, procedure, and site was verified.   Injection Procedure Details:   Procedure diagnoses: Lumbar radiculopathy [M54.16]   Meds Administered:  Meds ordered this encounter  Medications   methylPREDNISolone acetate (DEPO-MEDROL) injection 80 mg     Laterality: Right  Location/Site:  L4-5  Needle: 3.5 in., 20 ga. Tuohy  Needle Placement: Paramedian epidural  Findings:   -Comments: Excellent flow of contrast into the epidural space.  Procedure Details: Using a paramedian approach from the side mentioned above, the region overlying the inferior lamina was localized under fluoroscopic visualization and the soft tissues overlying this structure were infiltrated with 4 ml. of 1% Lidocaine without Epinephrine. The Tuohy needle was inserted into the epidural space using a paramedian approach.   The epidural space was localized using loss of resistance along with counter oblique bi-planar fluoroscopic views.  After negative aspirate for air, blood, and CSF, a 2 ml. volume of Isovue-250 was injected into the epidural space and the flow of contrast was observed. Radiographs were obtained for documentation purposes.    The injectate was administered into the level noted above.   Additional Comments:  The patient tolerated the procedure well Dressing: 2 x 2 sterile gauze and Band-Aid    Post-procedure details: Patient was observed during the procedure. Post-procedure instructions were reviewed.  Patient left the clinic in stable  condition.

## 2021-06-02 NOTE — Progress Notes (Signed)
[name] [name] - 66 y.o. male MRN [id number]  Date of birth: Aug 18, 1955  Office Visit Note: Visit Date: 05/17/2021 PCP: Tanda Rockers, MD Referred by: Tanda Rockers, MD  Subjective: Chief Complaint  Patient presents with   Lower Back - Follow-up   HPI:  [name] [name] is a 66 y.o. male who comes in today at the request of Benita Stabile, PA-C for planned Right L4-5 Lumbar Interlaminar epidural steroid injection with fluoroscopic guidance.  The patient has failed conservative care including home exercise, medications, time and activity modification.  This injection will be diagnostic and hopefully therapeutic.  Please see requesting physician notes for further details and justification. MRI reviewed with images and spine model.  MRI reviewed in the note below.  Prior transforaminal epidural steroid injection years ago did give him a lot of relief.  His disc herniation was bigger at the time and more truly radicular pain.  Having pain really centered lower back somewhat into the buttocks.  He still feel like this is probably related to the lateral recess narrowing than arthritis.  Would consider transforaminal injection versus facet joint block if he just does not get as much relief from this injection.  ROS Otherwise per HPI.  Assessment & Plan: Visit Diagnoses:    ICD-10-CM   1. Lumbar radiculopathy  M54.16 XR C-ARM NO REPORT    Epidural Steroid injection    methylPREDNISolone acetate (DEPO-MEDROL) injection 80 mg      Plan: No additional findings.   Meds & Orders:  Meds ordered this encounter  Medications   methylPREDNISolone acetate (DEPO-MEDROL) injection 80 mg    Orders Placed This Encounter  Procedures   XR C-ARM NO REPORT   Epidural Steroid injection    Follow-up: Return if symptoms worsen or fail to improve.   Procedures: No procedures performed  Lumbar Epidural Steroid Injection - Interlaminar Approach with Fluoroscopic Guidance  Patient: [name] [name]      Date of Birth: 05-17-1955 MRN: [id number] PCP: Tanda Rockers, MD      Visit Date: 05/17/2021   Universal Protocol:     Consent Given By: the patient  Position: PRONE  Additional Comments: Vital signs were monitored before and after the procedure. Patient was prepped and draped in the usual sterile fashion. The correct patient, procedure, and site was verified.   Injection Procedure Details:   Procedure diagnoses: Lumbar radiculopathy [M54.16]   Meds Administered:  Meds ordered this encounter  Medications   methylPREDNISolone acetate (DEPO-MEDROL) injection 80 mg     Laterality: Right  Location/Site:  L4-5  Needle: 3.5 in., 20 ga. Tuohy  Needle Placement: Paramedian epidural  Findings:   -Comments: Excellent flow of contrast into the epidural space.  Procedure Details: Using a paramedian approach from the side mentioned above, the region overlying the inferior lamina was localized under fluoroscopic visualization and the soft tissues overlying this structure were infiltrated with 4 ml. of 1% Lidocaine without Epinephrine. The Tuohy needle was inserted into the epidural space using a paramedian approach.   The epidural space was localized using loss of resistance along with counter oblique bi-planar fluoroscopic views.  After negative aspirate for air, blood, and CSF, a 2 ml. volume of Isovue-250 was injected into the epidural space and the flow of contrast was observed. Radiographs were obtained for documentation purposes.    The injectate was administered into the level noted above.   Additional Comments:  The patient tolerated the procedure well Dressing: 2 x  2 sterile gauze and Band-Aid    Post-procedure details: Patient was observed during the procedure. Post-procedure instructions were reviewed.  Patient left the clinic in stable condition.   Clinical History: MRI LUMBAR SPINE WITHOUT CONTRAST   TECHNIQUE: Multiplanar, multisequence MR  imaging of the lumbar spine was performed. No intravenous contrast was administered.   COMPARISON:  Lumbar spine MRI 11/02/2016   FINDINGS: Segmentation:  5 lumbar vertebrae.  Hypoplastic ribs at T12.   Alignment: Mild lumbar dextroscoliosis. Trace retrolisthesis of L5 on S1.   Vertebrae: No fracture, suspicious marrow lesion, or significant marrow edema.   Conus medullaris and cauda equina: Conus extends to the T12 level. Conus and cauda equina appear normal.   Paraspinal and other soft tissues: Unremarkable.   Disc levels:   Disc desiccation from L2-3 to L5-S1. Mild disc space narrowing from L2-3 to L4-5 and moderate narrowing at L5-S1.   T12-L1 and L1-2: Negative.   L2-3: Disc bulging, a right foraminal to extraforaminal disc protrusion with annular fissure, and mild facet hypertrophy result in unchanged mild right neural foraminal stenosis without spinal stenosis.   L3-4: Disc bulging and mild-to-moderate facet and ligamentum flavum hypertrophy have progressed and result in mild left greater than right lateral recess stenosis and mild left greater than right neural foraminal stenosis without spinal stenosis.   L4-5: The right paracentral to subarticular disc extrusion on the prior study has decreased in size. A residual moderately large disc protrusion results in severe residual right lateral recess stenosis with right L5 nerve root impingement. Disc bulging and mild facet hypertrophy result in borderline to mild bilateral neural foraminal stenosis. No significant residual generalized spinal stenosis.   L5-S1: Disc bulging, a broad central disc protrusion, and slight facet hypertrophy result in borderline to mild bilateral lateral recess stenosis without spinal or neural foraminal stenosis, unchanged.   IMPRESSION: 1. Decreased size of L4-5 disc herniation with improved spinal canal patency. Residual right lateral recess stenosis and right L5 nerve root  impingement. 2. Mildly progressive disc and facet degeneration at L3-4 with mild lateral recess and neural foraminal stenosis.     Electronically Signed   By: Logan Bores M.D.   On: 04/08/2021 09:06     Objective:  VS:  HT:    WT:   BMI:     BP:134/82  HR:63bpm  TEMP: ( )  RESP:95 % Physical Exam Vitals and nursing note reviewed.  Constitutional:      General: He is not in acute distress.    Appearance: Normal appearance. He is not ill-appearing.  HENT:     Head: Normocephalic and atraumatic.     Right Ear: External ear normal.     Left Ear: External ear normal.     Nose: No congestion.  Eyes:     Extraocular Movements: Extraocular movements intact.  Cardiovascular:     Rate and Rhythm: Normal rate.     Pulses: Normal pulses.  Pulmonary:     Effort: Pulmonary effort is normal. No respiratory distress.  Abdominal:     General: There is no distension.     Palpations: Abdomen is soft.  Musculoskeletal:        General: No tenderness or signs of injury.     Cervical back: Neck supple.     Right lower leg: No edema.     Left lower leg: No edema.     Comments: Patient has good distal strength without clonus.  Skin:    Findings: No erythema or rash.  Neurological:     General: No focal deficit present.     Mental Status: He is alert and oriented to person, place, and time.     Sensory: No sensory deficit.     Motor: No weakness or abnormal muscle tone.     Coordination: Coordination normal.  Psychiatric:        Mood and Affect: Mood normal.        Behavior: Behavior normal.     Imaging: No results found.

## 2021-06-13 ENCOUNTER — Other Ambulatory Visit: Payer: Self-pay | Admitting: Physician Assistant

## 2021-07-08 ENCOUNTER — Other Ambulatory Visit: Payer: Self-pay | Admitting: *Deleted

## 2021-07-08 ENCOUNTER — Other Ambulatory Visit: Payer: Self-pay

## 2021-07-08 MED ORDER — SIMVASTATIN 40 MG PO TABS: 80.0000 mg | ORAL_TABLET | Freq: Every day | ORAL | 0 refills | Status: DC

## 2021-07-19 ENCOUNTER — Ambulatory Visit: Payer: Self-pay

## 2021-07-19 ENCOUNTER — Ambulatory Visit (INDEPENDENT_AMBULATORY_CARE_PROVIDER_SITE_OTHER): Payer: 59 | Admitting: Physical Medicine and Rehabilitation

## 2021-07-19 ENCOUNTER — Encounter: Payer: Self-pay | Admitting: Physical Medicine and Rehabilitation

## 2021-07-19 VITALS — BP 135/84 | HR 61

## 2021-07-19 DIAGNOSIS — M5416 Radiculopathy, lumbar region: Secondary | ICD-10-CM

## 2021-07-19 MED ORDER — METHYLPREDNISOLONE ACETATE 80 MG/ML IJ SUSP
80.0000 mg | Freq: Once | INTRAMUSCULAR | Status: AC
  Administered 2021-07-19: 80 mg

## 2021-07-19 NOTE — Progress Notes (Signed)
Pt state lower back pain. Pt state walking, standing and sitting makes the pain worse. Pt state he takes over the counter pain meds to help ease his pain.  Numeric Pain Rating Scale and Functional Assessment Average Pain 8   In the last MONTH (on 0-10 scale) has pain interfered with the following?  1. General activity like being  able to carry out your everyday physical activities such as walking, climbing stairs, carrying groceries, or moving a chair?  Rating(10)   +Driver, -BT, -Dye Allergies.

## 2021-07-19 NOTE — Procedures (Signed)
Lumbosacral Transforaminal Epidural Steroid Injection - Sub-Pedicular Approach with Fluoroscopic Guidance  Patient: [name] [name]      Date of Birth: Nov 14, 1955 MRN: [id number] PCP: Tanda Rockers, MD      Visit Date: 07/19/2021   Universal Protocol:    Date/Time: 07/19/2021  Consent Given By: the patient  Position: PRONE  Additional Comments: Vital signs were monitored before and after the procedure. Patient was prepped and draped in the usual sterile fashion. The correct patient, procedure, and site was verified.   Injection Procedure Details:   Procedure diagnoses: Lumbar radiculopathy [M54.16]    Meds Administered:  Meds ordered this encounter  Medications   methylPREDNISolone acetate (DEPO-MEDROL) injection 80 mg    Laterality: Right  Location/Site: L4  Needle:5.0 in., 22 ga.  Short bevel or Quincke spinal needle  Needle Placement: Transforaminal  Findings:    -Comments: Excellent flow of contrast along the nerve, nerve root and into the epidural space.  Procedure Details: After squaring off the end-plates to get a true AP view, the C-arm was positioned so that an oblique view of the foramen as noted above was visualized. The target area is just inferior to the "nose of the scotty dog" or sub pedicular. The soft tissues overlying this structure were infiltrated with 2-3 ml. of 1% Lidocaine without Epinephrine.  The spinal needle was inserted toward the target using a "trajectory" view along the fluoroscope beam.  Under AP and lateral visualization, the needle was advanced so it did not puncture dura and was located close the 6 O'Clock position of the pedical in AP tracterory. Biplanar projections were used to confirm position. Aspiration was confirmed to be negative for CSF and/or blood. A 1-2 ml. volume of Isovue-250 was injected and flow of contrast was noted at each level. Radiographs were obtained for documentation purposes.   After attaining the desired  flow of contrast documented above, a 0.5 to 1.0 ml test dose of 0.25% Marcaine was injected into each respective transforaminal space.  The patient was observed for 90 seconds post injection.  After no sensory deficits were reported, and normal lower extremity motor function was noted,   the above injectate was administered so that equal amounts of the injectate were placed at each foramen (level) into the transforaminal epidural space.   Additional Comments:  The patient tolerated the procedure well Dressing: 2 x 2 sterile gauze and Band-Aid    Post-procedure details: Patient was observed during the procedure. Post-procedure instructions were reviewed.  Patient left the clinic in stable condition.

## 2021-07-19 NOTE — Progress Notes (Signed)
[name] [name] - 66 y.o. male MRN [id number]  Date of birth: 23-Jun-1955  Office Visit Note: Visit Date: 07/19/2021 PCP: Tanda Rockers, MD Referred by: Tanda Rockers, MD  Subjective: Chief Complaint  Patient presents with   Lower Back - Pain   HPI:  [name] [name] is a 66 y.o. male who comes in today at the request of Barnet Pall, FNP for planned Right L4-5 Lumbar Transforaminal epidural steroid injection with fluoroscopic guidance.  The patient has failed conservative care including home exercise, medications, time and activity modification.  This injection will be diagnostic and hopefully therapeutic.  Please see requesting physician notes for further details and justification.  Mostly midline low back pain some radiating into the right more than left buttock area.  Known history of lumbar herniated disc with some regression but still impacting the lateral recess.  We will try a right L4 transforaminal injection today could conceivably look at regrouping with physical therapy for more dry needling and manual treatment.   ROS Otherwise per HPI.  Assessment & Plan: Visit Diagnoses:    ICD-10-CM   1. Lumbar radiculopathy  M54.16 XR C-ARM NO REPORT    Epidural Steroid injection    methylPREDNISolone acetate (DEPO-MEDROL) injection 80 mg      Plan: No additional findings.   Meds & Orders:  Meds ordered this encounter  Medications   methylPREDNISolone acetate (DEPO-MEDROL) injection 80 mg    Orders Placed This Encounter  Procedures   XR C-ARM NO REPORT   Epidural Steroid injection    Follow-up: Return if symptoms worsen or fail to improve.   Procedures: No procedures performed  Lumbosacral Transforaminal Epidural Steroid Injection - Sub-Pedicular Approach with Fluoroscopic Guidance  Patient: [name] [name]      Date of Birth: 1955/06/02 MRN: [id number] PCP: Tanda Rockers, MD      Visit Date: 07/19/2021   Universal Protocol:    Date/Time:  07/19/2021  Consent Given By: the patient  Position: PRONE  Additional Comments: Vital signs were monitored before and after the procedure. Patient was prepped and draped in the usual sterile fashion. The correct patient, procedure, and site was verified.   Injection Procedure Details:   Procedure diagnoses: Lumbar radiculopathy [M54.16]    Meds Administered:  Meds ordered this encounter  Medications   methylPREDNISolone acetate (DEPO-MEDROL) injection 80 mg    Laterality: Right  Location/Site: L4  Needle:5.0 in., 22 ga.  Short bevel or Quincke spinal needle  Needle Placement: Transforaminal  Findings:    -Comments: Excellent flow of contrast along the nerve, nerve root and into the epidural space.  Procedure Details: After squaring off the end-plates to get a true AP view, the C-arm was positioned so that an oblique view of the foramen as noted above was visualized. The target area is just inferior to the "nose of the scotty dog" or sub pedicular. The soft tissues overlying this structure were infiltrated with 2-3 ml. of 1% Lidocaine without Epinephrine.  The spinal needle was inserted toward the target using a "trajectory" view along the fluoroscope beam.  Under AP and lateral visualization, the needle was advanced so it did not puncture dura and was located close the 6 O'Clock position of the pedical in AP tracterory. Biplanar projections were used to confirm position. Aspiration was confirmed to be negative for CSF and/or blood. A 1-2 ml. volume of Isovue-250 was injected and flow of contrast was noted at each level. Radiographs were obtained for documentation purposes.  After attaining the desired flow of contrast documented above, a 0.5 to 1.0 ml test dose of 0.25% Marcaine was injected into each respective transforaminal space.  The patient was observed for 90 seconds post injection.  After no sensory deficits were reported, and normal lower extremity motor function was  noted,   the above injectate was administered so that equal amounts of the injectate were placed at each foramen (level) into the transforaminal epidural space.   Additional Comments:  The patient tolerated the procedure well Dressing: 2 x 2 sterile gauze and Band-Aid    Post-procedure details: Patient was observed during the procedure. Post-procedure instructions were reviewed.  Patient left the clinic in stable condition.    Clinical History: MRI LUMBAR SPINE WITHOUT CONTRAST   TECHNIQUE: Multiplanar, multisequence MR imaging of the lumbar spine was performed. No intravenous contrast was administered.   COMPARISON:  Lumbar spine MRI 11/02/2016   FINDINGS: Segmentation:  5 lumbar vertebrae.  Hypoplastic ribs at T12.   Alignment: Mild lumbar dextroscoliosis. Trace retrolisthesis of L5 on S1.   Vertebrae: No fracture, suspicious marrow lesion, or significant marrow edema.   Conus medullaris and cauda equina: Conus extends to the T12 level. Conus and cauda equina appear normal.   Paraspinal and other soft tissues: Unremarkable.   Disc levels:   Disc desiccation from L2-3 to L5-S1. Mild disc space narrowing from L2-3 to L4-5 and moderate narrowing at L5-S1.   T12-L1 and L1-2: Negative.   L2-3: Disc bulging, a right foraminal to extraforaminal disc protrusion with annular fissure, and mild facet hypertrophy result in unchanged mild right neural foraminal stenosis without spinal stenosis.   L3-4: Disc bulging and mild-to-moderate facet and ligamentum flavum hypertrophy have progressed and result in mild left greater than right lateral recess stenosis and mild left greater than right neural foraminal stenosis without spinal stenosis.   L4-5: The right paracentral to subarticular disc extrusion on the prior [name] has decreased in size. A residual moderately large disc protrusion results in severe residual right lateral recess stenosis with right L5 nerve root  impingement. Disc bulging and mild facet hypertrophy result in borderline to mild bilateral neural foraminal stenosis. No significant residual generalized spinal stenosis.   L5-S1: Disc bulging, a broad central disc protrusion, and slight facet hypertrophy result in borderline to mild bilateral lateral recess stenosis without spinal or neural foraminal stenosis, unchanged.   IMPRESSION: 1. Decreased size of L4-5 disc herniation with improved spinal canal patency. Residual right lateral recess stenosis and right L5 nerve root impingement. 2. Mildly progressive disc and facet degeneration at L3-4 with mild lateral recess and neural foraminal stenosis.     Electronically Signed   By: Logan Bores M.D.   On: 04/08/2021 09:06     Objective:  VS:  HT:    WT:   BMI:     BP:135/84  HR:61bpm  TEMP: ( )  RESP:  Physical Exam Vitals and nursing note reviewed.  Constitutional:      General: He is not in acute distress.    Appearance: Normal appearance. He is not ill-appearing.  HENT:     Head: Normocephalic and atraumatic.     Right Ear: External ear normal.     Left Ear: External ear normal.     Nose: No congestion.  Eyes:     Extraocular Movements: Extraocular movements intact.  Cardiovascular:     Rate and Rhythm: Normal rate.     Pulses: Normal pulses.  Pulmonary:     Effort:  Pulmonary effort is normal. No respiratory distress.  Abdominal:     General: There is no distension.     Palpations: Abdomen is soft.  Musculoskeletal:        General: No tenderness or signs of injury.     Cervical back: Neck supple.     Right lower leg: No edema.     Left lower leg: No edema.     Comments: Patient has good distal strength without clonus.  Skin:    Findings: No erythema or rash.  Neurological:     General: No focal deficit present.     Mental Status: He is alert and oriented to person, place, and time.     Sensory: No sensory deficit.     Motor: No weakness or abnormal  muscle tone.     Coordination: Coordination normal.  Psychiatric:        Mood and Affect: Mood normal.        Behavior: Behavior normal.      Imaging: XR C-ARM NO REPORT  Result Date: 07/19/2021 Please see Notes tab for imaging impression.

## 2021-07-19 NOTE — Patient Instructions (Signed)

## 2021-07-27 ENCOUNTER — Ambulatory Visit (INDEPENDENT_AMBULATORY_CARE_PROVIDER_SITE_OTHER): Payer: 59 | Admitting: Internal Medicine

## 2021-07-27 ENCOUNTER — Encounter: Payer: Self-pay | Admitting: Internal Medicine

## 2021-07-27 VITALS — BP 126/80 | HR 80 | Resp 18 | Ht 68.0 in | Wt 200.0 lb

## 2021-07-27 DIAGNOSIS — Z23 Encounter for immunization: Secondary | ICD-10-CM | POA: Diagnosis not present

## 2021-07-27 DIAGNOSIS — R7303 Prediabetes: Secondary | ICD-10-CM | POA: Diagnosis not present

## 2021-07-27 DIAGNOSIS — E118 Type 2 diabetes mellitus with unspecified complications: Secondary | ICD-10-CM | POA: Insufficient documentation

## 2021-07-27 DIAGNOSIS — I10 Essential (primary) hypertension: Secondary | ICD-10-CM

## 2021-07-27 DIAGNOSIS — R42 Dizziness and giddiness: Secondary | ICD-10-CM | POA: Diagnosis not present

## 2021-07-27 DIAGNOSIS — Z1159 Encounter for screening for other viral diseases: Secondary | ICD-10-CM

## 2021-07-27 DIAGNOSIS — E782 Mixed hyperlipidemia: Secondary | ICD-10-CM

## 2021-07-27 DIAGNOSIS — Z0000 Encounter for general adult medical examination without abnormal findings: Secondary | ICD-10-CM | POA: Diagnosis not present

## 2021-07-27 LAB — LIPID PANEL
Cholesterol: 114 mg/dL (ref 0–200)
HDL: 40.7 mg/dL (ref >39.00)
LDL Cholesterol: 58 mg/dL (ref 0–99)
NonHDL: 73.5
Total CHOL/HDL Ratio: 3
Triglycerides: 76 mg/dL (ref 0.0–149.0)
VLDL: 15.2 mg/dL (ref 0.0–40.0)

## 2021-07-27 LAB — HEMOGLOBIN A1C: Hgb A1c MFr Bld: 7 % — ABNORMAL HIGH (ref 4.6–6.5)

## 2021-07-27 LAB — COMPREHENSIVE METABOLIC PANEL
ALT: 52 U/L (ref 0–53)
AST: 29 U/L (ref 0–37)
Albumin: 4.8 g/dL (ref 3.5–5.2)
Alkaline Phosphatase: 40 U/L (ref 39–117)
BUN: 32 mg/dL — ABNORMAL HIGH (ref 6–23)
CO2: 29 mEq/L (ref 19–32)
Calcium: 9.9 mg/dL (ref 8.4–10.5)
Chloride: 99 mEq/L (ref 96–112)
Creatinine, Ser: 1.16 mg/dL (ref 0.40–1.50)
GFR: 65.43 mL/min (ref >60.00)
Glucose, Bld: 102 mg/dL — ABNORMAL HIGH (ref 70–99)
Potassium: 3.9 mEq/L (ref 3.5–5.1)
Sodium: 135 mEq/L (ref 135–145)
Total Bilirubin: 0.6 mg/dL (ref 0.2–1.2)
Total Protein: 7.4 g/dL (ref 6.0–8.3)

## 2021-07-27 LAB — CBC
HCT: 36.9 % — ABNORMAL LOW (ref 39.0–52.0)
Hemoglobin: 12.4 g/dL — ABNORMAL LOW (ref 13.0–17.0)
MCHC: 33.6 g/dL (ref 30.0–36.0)
MCV: 93 fl (ref 78.0–100.0)
Platelets: 238 10*3/uL (ref 150.0–400.0)
RBC: 3.96 Mil/uL — ABNORMAL LOW (ref 4.22–5.81)
RDW: 13.2 % (ref 11.5–15.5)
WBC: 6.9 10*3/uL (ref 4.0–10.5)

## 2021-07-27 MED ORDER — MECLIZINE HCL 25 MG PO TABS: 25.0000 mg | ORAL_TABLET | Freq: Three times a day (TID) | ORAL | 0 refills | Status: DC | PRN

## 2021-07-27 NOTE — Assessment & Plan Note (Signed)
BP at goal on losartan/hctz 100/25 mg daily and metoprolol 25 mg BID and checking CBC and CMP and lipid panel today.

## 2021-07-27 NOTE — Assessment & Plan Note (Signed)
Has had elevated HgA1c in the past without consistent follow up. Checking HgA1c today. Treat as appropriate and discussed briefly with patient today.

## 2021-07-27 NOTE — Assessment & Plan Note (Signed)
Goal LDL <70 which was not at goal last lipid panel. Checking lipid panel today and may need risk from simvastatin to lipitor if persistently high. Is currently taking simvastatin 40 mg daily and zetia 10 mg daily. Adjust as needed.

## 2021-07-27 NOTE — Progress Notes (Signed)
Subjective:   Patient ID: [name] [name], male    DOB: November 24, 1955, 66 y.o.   MRN: [id number]  HPI Here for physical, transfer of care/NP visit and needing ongoing care. Please see A/P for status and treatment of chronic medical problems.   Diet: heart healthy (may be undiagnosed diabetic on review of labs) Physical activity: sedentary Depression/mood screen: negative Hearing: intact to whispered voice Visual acuity: grossly normal with contacts, performs annual eye exam  ADLs: capable Fall risk: none Home safety: good Cognitive evaluation: intact to orientation, naming, recall and repetition EOL planning: adv directives discussed, not in place  [employer] Visit from 07/27/2021 in [place] at [place] Visit from 07/27/2021 in [place] at [facility]  PHQ-9 Total Score 0         03/20/2015    9:40 AM 03/20/2015    9:41 AM 07/15/2019    9:43 AM 09/14/2019   10:17 AM 07/27/2021    8:04 AM  [place] in the past year? No No   0  Was there an injury with Fall?     0  Fall Risk Category Calculator     0  Fall Risk Category     Low  Patient Fall Risk Level   Moderate fall risk Low fall risk     I have personally reviewed and have noted 1. The patient's medical and social history - reviewed today no changes 2. Their use of alcohol, tobacco or illicit drugs 3. Their current medications and supplements 4. The patient's functional ability including ADL's, fall risks, home safety risks and hearing or visual impairment. 5. Diet and physical activities 6. Evidence for depression or mood disorders 7. Care team reviewed and updated 8.  The patient is not on an opioid pain medication.  Patient Care Team: Hoyt Koch, MD as PCP - General (Internal Medicine) Josue Hector, MD as PCP - Cardiology (Cardiology) Josue Hector, MD as Consulting Physician (Cardiology) Grace Isaac,  MD (Inactive) (Cardiothoracic Surgery) Past Medical History:  Diagnosis Date   Arthritis    Blood clot in vein    superficial / right leg following CABG   CAD (coronary artery disease)    Chest pain    Colon polyps    Complication of anesthesia    hard to wake up   Essential hypertension, benign    Headache    migraines as teenager   Heart murmur    History of blood transfusion    History of kidney stones    Hyperlipemia    Obesity    Tobacco abuse    Past Surgical History:  Procedure Laterality Date   CARDIAC CATHETERIZATION     CIRCUMCISION  1986   CORONARY ARTERY BYPASS GRAFT     Coronary artery bypass grafting x4  10/21/2010   CYSTOSCOPY WITH RETROGRADE PYELOGRAM, URETEROSCOPY AND STENT PLACEMENT Left 09/01/2014   Procedure: CYSTOSCOPY WITH RETROGRADE PYELOGRAM, URETEROSCOPY AND STENT PLACEMENT;  Surgeon: Irine Seal, MD;  Location: [facility];  Service: Urology;  Laterality: Left;   CYSTOSCOPY WITH URETEROSCOPY AND STENT PLACEMENT Left 09/09/2014   Procedure: CYSTOSCOPY,LEFT URETEROSCOPY AND STENT PLACEMENT;  Surgeon: Irine Seal, MD;  Location: [facility];  Service: Urology;  Laterality: Left;   HEMORRHOID SURGERY     HOLMIUM LASER APPLICATION Left 04/18/8117   Procedure: WITH HOLMIUM LASER ;  Surgeon: Irine Seal, MD;  Location:  [facility];  Service: Urology;  Laterality: Left;   Stapled hemorrhoidopexy.  10/19/2006   TOTAL HIP ARTHROPLASTY Left 05/29/2015   Procedure: LEFT TOTAL HIP ARTHROPLASTY ANTERIOR APPROACH;  Surgeon: Mcarthur Rossetti, MD;  Location: [facility];  Service: Orthopedics;  Laterality: Left;   VASECTOMY  1986   Family History  Problem Relation Age of Onset   Hypertension Father    Leukemia Mother    Hypertension Mother    Colon cancer Neg Hx    Review of Systems  Constitutional: Negative.   HENT: Negative.    Eyes: Negative.   Respiratory:  Negative for cough, chest tightness and shortness of breath.   Cardiovascular:  Negative for chest pain, palpitations  and leg swelling.  Gastrointestinal:  Negative for abdominal distention, abdominal pain, constipation, diarrhea, nausea and vomiting.  Musculoskeletal: Negative.   Skin: Negative.   Neurological: Negative.   Psychiatric/Behavioral: Negative.      Objective:  Physical Exam Constitutional:      Appearance: He is well-developed.  HENT:     Head: Normocephalic and atraumatic.  Cardiovascular:     Rate and Rhythm: Normal rate and regular rhythm.  Pulmonary:     Effort: Pulmonary effort is normal. No respiratory distress.     Breath sounds: Normal breath sounds. No wheezing or rales.  Abdominal:     General: Bowel sounds are normal. There is no distension.     Palpations: Abdomen is soft.     Tenderness: There is no abdominal tenderness. There is no rebound.  Musculoskeletal:     Cervical back: Normal range of motion.  Skin:    General: Skin is warm and dry.  Neurological:     Mental Status: He is alert and oriented to person, place, and time.     Coordination: Coordination normal.    Vitals:   07/27/21 0759  BP: 126/80  Pulse: 80  Resp: 18  SpO2: 96%  Weight: 200 lb (90.7 kg)  Height: '5\' 8"'$  (1.727 m)    Assessment & Plan:  Prevnar 20 given at visit

## 2021-07-27 NOTE — Patient Instructions (Signed)
We will check the labs today and have given you the pneumonia vaccine.

## 2021-07-28 LAB — HEPATITIS C ANTIBODY: Hepatitis C Ab: NONREACTIVE

## 2021-07-29 ENCOUNTER — Telehealth: Payer: Self-pay | Admitting: Physical Medicine and Rehabilitation

## 2021-07-29 ENCOUNTER — Telehealth: Payer: Self-pay | Admitting: Internal Medicine

## 2021-07-29 DIAGNOSIS — E119 Type 2 diabetes mellitus without complications: Secondary | ICD-10-CM

## 2021-07-29 NOTE — Telephone Encounter (Signed)
[name] [name] had an epidural injection last week with Ernestina Patches and his symptoms are not getting better. Please advise

## 2021-07-29 NOTE — Telephone Encounter (Signed)
PT calls today in need of advice on how to deal with his diabetes. PT had received a MyChart message from [facility] regarding his recent labs and was wanting to get some insight on ways to keep his blood sugar levels down. He would be willing to take a phone call or a my chart message, but was wanting to try and start making changes ASAP.  CB if needed: [phone]

## 2021-07-30 DIAGNOSIS — R42 Dizziness and giddiness: Secondary | ICD-10-CM | POA: Insufficient documentation

## 2021-07-30 NOTE — Assessment & Plan Note (Signed)
Prior episode of what sounds like BPPV. He has old prescription of meclizine which he likes to keep in case of flare. Needs refill as his is outdated. Okay with as needed meclizine 25 mg TID prn. Counseled that he should not take when not have dizziness as this medication can cause dizziness.

## 2021-07-30 NOTE — Assessment & Plan Note (Signed)
Flu shot yearly. Covid-19 counseled. Pneumonia 20 given today to complete. Shingrix counseled due at pharmacy. Tetanus due 2029. Colonoscopy due 2024. Counseled about sun safety and mole surveillance. Counseled about the dangers of distracted driving. Given 10 year screening recommendations.

## 2021-07-30 NOTE — Telephone Encounter (Signed)
Referral to nutrition done, to make changes decrease carbohydrates in food/drink.

## 2021-08-03 ENCOUNTER — Encounter: Payer: Self-pay | Admitting: Physical Medicine and Rehabilitation

## 2021-08-03 ENCOUNTER — Ambulatory Visit (INDEPENDENT_AMBULATORY_CARE_PROVIDER_SITE_OTHER): Payer: 59 | Admitting: Physical Medicine and Rehabilitation

## 2021-08-03 VITALS — BP 124/74 | HR 54

## 2021-08-03 DIAGNOSIS — G8929 Other chronic pain: Secondary | ICD-10-CM | POA: Diagnosis not present

## 2021-08-03 DIAGNOSIS — M47816 Spondylosis without myelopathy or radiculopathy, lumbar region: Secondary | ICD-10-CM | POA: Diagnosis not present

## 2021-08-03 DIAGNOSIS — M545 Low back pain, unspecified: Secondary | ICD-10-CM

## 2021-08-03 DIAGNOSIS — M5136 Other intervertebral disc degeneration, lumbar region: Secondary | ICD-10-CM

## 2021-08-03 NOTE — Progress Notes (Unsigned)
Pt state lower back pain. Pt state walking, standing and sitting makes the pain worse. Pt state he takes over the counter pain meds to help ease his pain. Pt has hx of inj on 07/19/21 pt it didn't work. Pt state the pain has been coming on stronger.  Numeric Pain Rating Scale and Functional Assessment Average Pain 8  Pain Right Now 5 My pain is intermittent, sharp, burning, dull, and aching Pain is worse with: walking, bending, sitting, standing, some activites, and laying down Pain improves with: heat/ice, medication, and injections   In the last MONTH (on 0-10 scale) has pain interfered with the following?  1. General activity like being  able to carry out your everyday physical activities such as walking, climbing stairs, carrying groceries, or moving a chair?  Rating(5)  2. Relation with others like being able to carry out your usual social activities and roles such as  activities at home, at work and in your community. Rating(6)  3. Enjoyment of life such that you have  been bothered by emotional problems such as feeling anxious, depressed or irritable?  Rating(7)

## 2021-08-03 NOTE — Progress Notes (Unsigned)
[name] [name] - 66 y.o. male MRN [id number]  Date of birth: 1955/10/28  Office Visit Note: Visit Date: 08/03/2021 PCP: Hoyt Koch, MD Referred by: Hoyt Koch, *  Subjective: Chief Complaint  Patient presents with   Lower Back - Pain   HPI: [name] [name] is a 66 y.o. male who comes in today for evaluation of chronic, worsening and severe midline lower back pain. Pain has been ongoing for several years, patient was initially seen in our office in 2018. Patient does not report specific aggravating factors associated with his pain, however states his back does feel stiff when moving from sitting to standing position intermittently. States typical pain is 8 out of 10. He reports some relief of pain with home exercise regimen, rest and use of over the counter medications. Patient also reports relief of pain with formal physical therapy in the past with Dr. Rudene Anda at [facility]. Patients lumbar MRI imaging from March exhibits decreased size of right disc extrusion at L4-L5, there is residual moderately large disc protrusion at this same level resulting in severe residual right lateral recess stenosis with right L5 nerve root impingement. No high grade spinal canal stenosis noted. Patient has underwent multiple lumbar epidural steroid injections in our office, states right L4-L5 interlaminar injection in 2018 was most beneficial and helped alleviate pain for 5 years. Patient states he does experience severe pain daily. Patient denies focal weakness, numbness and tingling. Patient denies recent trauma or falls.    Review of Systems  Musculoskeletal:  Positive for back pain.  Neurological:  Negative for tingling, sensory change, focal weakness and weakness.  All other systems reviewed and are negative.  Otherwise per HPI.  Assessment & Plan: Visit Diagnoses:    ICD-10-CM   1. Chronic midline low back pain without sciatica  M54.50 Ambulatory referral to  Physical Medicine Rehab   G89.29     2. Spondylosis without myelopathy or radiculopathy, lumbar region  M47.816 Ambulatory referral to Physical Medicine Rehab    3. Other intervertebral disc degeneration, lumbar region  M51.36 Ambulatory referral to Physical Medicine Rehab       Plan: Findings:  Chronic, worsening and severe midline lower back pain. Patient continues to have severe pain despite good conservative therapies such as formal physical therapy, home exercise regimen, rest and use of over the counter medications. Patients clinical presentation and exam are complex, his symptoms do not directly correlate with lumbar MRI imaging, his pain pattern seems to fit more with facet mediated pain, however there are minimal arthritic changes on lumbar MRI imaging. Patient did receive significant and sustained relief of pain for 5 years with previous interlaminar lumbar injection in 2018. Next step is to perform diagnostic and hopefully therapeutic right L5-S1 interlaminar epidural steroid injection under fluoroscopic guidance. I would also like patient to re-group with Dr. Joaquin Music at [facility] for a short course of therapy. I did provide patient with physical copy of order to take to facility. No red flag symptoms noted upon exam today.     Meds & Orders: No orders of the defined types were placed in this encounter.   Orders Placed This Encounter  Procedures   Ambulatory referral to Physical Medicine Rehab    Follow-up: Return for L5-S1 interlaminar epidural steroid injection.   Procedures: No procedures performed      Clinical History: MRI LUMBAR SPINE WITHOUT CONTRAST   TECHNIQUE: Multiplanar, multisequence MR imaging of the lumbar spine was performed. No intravenous  contrast was administered.   COMPARISON:  Lumbar spine MRI 11/02/2016   FINDINGS: Segmentation:  5 lumbar vertebrae.  Hypoplastic ribs at T12.   Alignment: Mild lumbar dextroscoliosis. Trace retrolisthesis  of L5 on S1.   Vertebrae: No fracture, suspicious marrow lesion, or significant marrow edema.   Conus medullaris and cauda equina: Conus extends to the T12 level. Conus and cauda equina appear normal.   Paraspinal and other soft tissues: Unremarkable.   Disc levels:   Disc desiccation from L2-3 to L5-S1. Mild disc space narrowing from L2-3 to L4-5 and moderate narrowing at L5-S1.   T12-L1 and L1-2: Negative.   L2-3: Disc bulging, a right foraminal to extraforaminal disc protrusion with annular fissure, and mild facet hypertrophy result in unchanged mild right neural foraminal stenosis without spinal stenosis.   L3-4: Disc bulging and mild-to-moderate facet and ligamentum flavum hypertrophy have progressed and result in mild left greater than right lateral recess stenosis and mild left greater than right neural foraminal stenosis without spinal stenosis.   L4-5: The right paracentral to subarticular disc extrusion on the prior study has decreased in size. A residual moderately large disc protrusion results in severe residual right lateral recess stenosis with right L5 nerve root impingement. Disc bulging and mild facet hypertrophy result in borderline to mild bilateral neural foraminal stenosis. No significant residual generalized spinal stenosis.   L5-S1: Disc bulging, a broad central disc protrusion, and slight facet hypertrophy result in borderline to mild bilateral lateral recess stenosis without spinal or neural foraminal stenosis, unchanged.   IMPRESSION: 1. Decreased size of L4-5 disc herniation with improved spinal canal patency. Residual right lateral recess stenosis and right L5 nerve root impingement. 2. Mildly progressive disc and facet degeneration at L3-4 with mild lateral recess and neural foraminal stenosis.     Electronically Signed   By: Logan Bores M.D.   On: 04/08/2021 09:06   He reports that he quit smoking about 40 years ago. His smoking use  included cigarettes. He has a 3.50 pack-year smoking history. He has never used smokeless tobacco.  Recent Labs    07/27/21 0855  HGBA1C 7.0*    Objective:  VS:  HT:    WT:   BMI:     BP:124/74  HR: (!) 54bpm  TEMP: ( )  RESP:  Physical Exam  Ortho Exam  Imaging: No results found.  Past Medical/Family/Surgical/Social History: Medications & Allergies reviewed per EMR, new medications updated. Patient Active Problem List   Diagnosis Date Noted   Vertigo 07/30/2021   Pre-diabetes 07/27/2021   Left carotid bruit 11/14/2012   Health care maintenance 04/25/2012   Hip pain, left 04/06/2011   Hx of CABG 11/05/2010   CAD (coronary artery disease)    COLONIC POLYPS 05/26/2007   Essential hypertension 05/26/2007   Chronic ischemic heart disease 05/26/2007   Allergic rhinitis 05/26/2007   CONSTIPATION, CHRONIC 05/26/2007   Hyperlipidemia 05/09/2007   Past Medical History:  Diagnosis Date   Arthritis    Blood clot in vein    superficial / right leg following CABG   CAD (coronary artery disease)    Chest pain    Colon polyps    Complication of anesthesia    hard to wake up   Essential hypertension, benign    Headache    migraines as teenager   Heart murmur    History of blood transfusion    History of kidney stones    Hyperlipemia    Obesity    Tobacco  abuse    Family History  Problem Relation Age of Onset   Hypertension Father    Leukemia Mother    Hypertension Mother    Colon cancer Neg Hx    Past Surgical History:  Procedure Laterality Date   CARDIAC CATHETERIZATION     CIRCUMCISION  1986   CORONARY ARTERY BYPASS GRAFT     Coronary artery bypass grafting x4  10/21/2010   CYSTOSCOPY WITH RETROGRADE PYELOGRAM, URETEROSCOPY AND STENT PLACEMENT Left 09/01/2014   Procedure: CYSTOSCOPY WITH RETROGRADE PYELOGRAM, URETEROSCOPY AND STENT PLACEMENT;  Surgeon: Irine Seal, MD;  Location: [facility];  Service: Urology;  Laterality: Left;   CYSTOSCOPY WITH URETEROSCOPY AND  STENT PLACEMENT Left 09/09/2014   Procedure: CYSTOSCOPY,LEFT URETEROSCOPY AND STENT PLACEMENT;  Surgeon: Irine Seal, MD;  Location: [facility];  Service: Urology;  Laterality: Left;   HEMORRHOID SURGERY     HOLMIUM LASER APPLICATION Left 9/35/7017   Procedure: WITH HOLMIUM LASER ;  Surgeon: Irine Seal, MD;  Location: [facility];  Service: Urology;  Laterality: Left;   Stapled hemorrhoidopexy.  10/19/2006   TOTAL HIP ARTHROPLASTY Left 05/29/2015   Procedure: LEFT TOTAL HIP ARTHROPLASTY ANTERIOR APPROACH;  Surgeon: Mcarthur Rossetti, MD;  Location: [facility];  Service: Orthopedics;  Laterality: Left;   VASECTOMY  1986   Social History   Occupational History   Occupation: [employer]  Tobacco Use   Smoking status: Former    Packs/day: 0.50    Years: 7.00    Total pack years: 3.50    Types: Cigarettes    Quit date: 01/10/1981    Years since quitting: 40.5   Smokeless tobacco: Never  Vaping Use   Vaping Use: Never used  Substance and Sexual Activity   Alcohol use: No    Alcohol/week: 0.0 standard drinks of alcohol   Drug use: No   Sexual activity: Not on file

## 2021-08-04 ENCOUNTER — Telehealth: Payer: Self-pay | Admitting: Cardiovascular Disease

## 2021-08-04 MED ORDER — SIMVASTATIN 40 MG PO TABS: 80.0000 mg | ORAL_TABLET | Freq: Every day | ORAL | 0 refills | Status: DC

## 2021-08-04 NOTE — Telephone Encounter (Signed)
Pt's medication was sent to pt's pharmacy as requested. Confirmation received.  °

## 2021-08-04 NOTE — Telephone Encounter (Signed)
*  STAT* If patient is at the pharmacy, call can be transferred to refill team.   1. Which medications need to be refilled? (please list name of each medication and dose if known)   simvastatin (ZOCOR) 40 MG tablet    2. Which pharmacy/location (including street and city if local pharmacy) is medication to be sent to?  [facility] [id number] - [place], [place] [address]. Do they need a 30 day or 90 day supply?  30 day   Pt has scheduled appt for 08/18/21. Please advise

## 2021-08-11 ENCOUNTER — Telehealth: Payer: Self-pay | Admitting: Physical Medicine and Rehabilitation

## 2021-08-11 NOTE — Telephone Encounter (Signed)
Attempted to call patient to update on status of approval for lumbar ESI. I did have peer to peer this afternoon and referral was denied due to lack of radicular symptoms. I did leave message for patient to call us back, we will cancel injection appointment.

## 2021-08-16 ENCOUNTER — Telehealth: Payer: Self-pay | Admitting: Physical Medicine and Rehabilitation

## 2021-08-16 NOTE — Progress Notes (Unsigned)
Cardiology Office Note    Date:  08/18/2021   ID:  [name] [name], DOB [birth year], MRN [id number]   PCP:  Hoyt Koch, MD   [place]  Cardiologist:  Jenkins Rouge, MD   Advanced Practice Provider:  No care team member to display Electrophysiologist:  None   [id number]   Chief Complaint  Patient presents with   Follow-up   Chest Pain    History of Present Illness:  [name] [name] is a 66 y.o. male with history of CAD status post CABG in 2012 low risk Myoview 2019, hypertension, HLD.   I saw the patient 05/2020 and doing well.  Patient comes in for yearly f/u. 2 weeks agohad an uncomfortable feeling in his chest. His wife was dealing with some issues and under a lot of stress. It lasted a couple of days. No SOB or radiation. It felt like indigestion. Belching helped. He didn't try NTG or TUMS. Walks 2 miles 3 times a week without chest pain. Labs 07/2021 reviewed on [facility]. A1C 7.0. Seeing a nutritionist next month and has cut back on sugar and sweets.    Past Medical History:  Diagnosis Date   Arthritis    Blood clot in vein    superficial / right leg following CABG   CAD (coronary artery disease)    Chest pain    Colon polyps    Complication of anesthesia    hard to wake up   Essential hypertension, benign    Headache    migraines as teenager   Heart murmur    History of blood transfusion    History of kidney stones    Hyperlipemia    Obesity    Tobacco abuse     Past Surgical History:  Procedure Laterality Date   CARDIAC CATHETERIZATION     CIRCUMCISION  1986   CORONARY ARTERY BYPASS GRAFT     Coronary artery bypass grafting x4  10/21/2010   CYSTOSCOPY WITH RETROGRADE PYELOGRAM, URETEROSCOPY AND STENT PLACEMENT Left 09/01/2014   Procedure: CYSTOSCOPY WITH RETROGRADE PYELOGRAM, URETEROSCOPY AND STENT PLACEMENT;  Surgeon: Irine Seal, MD;  Location: [facility];  Service: Urology;  Laterality: Left;   CYSTOSCOPY WITH URETEROSCOPY  AND STENT PLACEMENT Left 09/09/2014   Procedure: CYSTOSCOPY,LEFT URETEROSCOPY AND STENT PLACEMENT;  Surgeon: Irine Seal, MD;  Location: [facility];  Service: Urology;  Laterality: Left;   HEMORRHOID SURGERY     HOLMIUM LASER APPLICATION Left 9/52/8413   Procedure: WITH HOLMIUM LASER ;  Surgeon: Irine Seal, MD;  Location: [facility];  Service: Urology;  Laterality: Left;   Stapled hemorrhoidopexy.  10/19/2006   TOTAL HIP ARTHROPLASTY Left 05/29/2015   Procedure: LEFT TOTAL HIP ARTHROPLASTY ANTERIOR APPROACH;  Surgeon: Mcarthur Rossetti, MD;  Location: [facility];  Service: Orthopedics;  Laterality: Left;   VASECTOMY  1986    Current Medications: Current Meds  Medication Sig   acetaminophen (TYLENOL) 650 MG CR tablet Take 650 mg by mouth every 8 (eight) hours as needed for pain.   aspirin EC 81 MG tablet Take 1 tablet (81 mg total) by mouth daily.   Camphor-Menthol-Methyl Sal (SALONPAS) 3.01-16-08 % PTCH Apply 1 patch topically daily as needed (back pain).   clotrimazole (LOTRIMIN) 1 % cream APPLY TO AFFECTED AREA(S) TWO TIMES A DAY   cyclobenzaprine (FLEXERIL) 10 MG tablet Take 1 tablet (10 mg total) by mouth 3 (three) times daily as needed for muscle spasms.   losartan-hydrochlorothiazide (HYZAAR) 100-25 MG tablet TAKE  ONE TABLET BY MOUTH DAILY   meclizine (ANTIVERT) 25 MG tablet Take 1 tablet (25 mg total) by mouth 3 (three) times daily as needed for dizziness.   Menthol, Topical Analgesic, (BENGAY EX) Apply 1 application topically daily as needed (back pain).   metoprolol tartrate (LOPRESSOR) 25 MG tablet Take 1 tablet (25 mg total) by mouth 2 (two) times daily.   nitroGLYCERIN (NITROSTAT) 0.4 MG SL tablet Place 1 tablet (0.4 mg total) under the tongue every 5 (five) minutes as needed for chest pain.   simvastatin (ZOCOR) 40 MG tablet Take 2 tablets (80 mg total) by mouth at bedtime.     Allergies:   Codeine   Social History   Socioeconomic History   Marital status: Married    Spouse name:  Not on file   Number of children: Not on file   Years of education: Not on file   Highest education level: Not on file  Occupational History   Occupation: [employer]  Tobacco Use   Smoking status: Former    Packs/day: 0.50    Years: 7.00    Total pack years: 3.50    Types: Cigarettes    Quit date: 01/10/1981    Years since quitting: 40.6   Smokeless tobacco: Never  Vaping Use   Vaping Use: Never used  Substance and Sexual Activity   Alcohol use: No    Alcohol/week: 0.0 standard drinks of alcohol   Drug use: No   Sexual activity: Not on file  Other Topics Concern   Not on file  Social History Narrative   Semi aerobic workouts   Social Determinants of Health   Financial Resource Strain: Not on file  Food Insecurity: Not on file  Transportation Needs: Not on file  Physical Activity: Not on file  Stress: Not on file  Social Connections: Not on file     Family History:  The patient's  family history includes Hypertension in his father and mother; Leukemia in his mother.   ROS:   Please see the history of present illness.    ROS All other systems reviewed and are negative.   PHYSICAL EXAM:   VS:  BP 122/66   Pulse (!) 48   Ht '5\' 8"'$  (1.727 m)   Wt 197 lb 12.8 oz (89.7 kg)   SpO2 99%   BMI 30.08 kg/m   Physical Exam  GEN: Well nourished, well developed, in no acute distress  Neck: no JVD, carotid bruits, or masses Cardiac:RRR; no murmurs, rubs, or gallops  Respiratory:  clear to auscultation bilaterally, normal work of breathing GI: soft, nontender, nondistended, + BS Ext: without cyanosis, clubbing, or edema, Good distal pulses bilaterally Neuro:  Alert and Oriented x 3,  Psych: euthymic mood, full affect  Wt Readings from Last 3 Encounters:  08/18/21 197 lb 12.8 oz (89.7 kg)  07/27/21 200 lb (90.7 kg)  03/17/21 202 lb 9.6 oz (91.9 kg)      Studies/Labs Reviewed:   EKG:  EKG is  ordered today.  The ekg ordered today demonstrates sinus bradycardia 48/m  otherwise normal EKG  Recent Labs: 09/23/2020: TSH 1.02 07/27/2021: ALT 52; BUN 32; Creatinine, Ser 1.16; Hemoglobin 12.4; Platelets 238.0; Potassium 3.9; Sodium 135   Lipid Panel    Component Value Date/Time   CHOL 114 07/27/2021 0855   CHOL 109 12/10/2020 0742   TRIG 76.0 07/27/2021 0855   HDL 40.70 07/27/2021 0855   HDL 41 12/10/2020 0742   CHOLHDL 3 07/27/2021 0855  VLDL 15.2 07/27/2021 0855   LDLCALC 58 07/27/2021 0855   LDLCALC 53 12/10/2020 0742    Additional studies/ records that were reviewed today include:  S/P CABG 10/19/10 [place] F/U CAD  SURGICAL PROCEDURE: Coronary artery bypass grafting x4 with the left  internal mammary to the left anterior descending coronary artery,  reverse saphenous vein graft to the second diagonal coronary artery,  reverse saphenous vein graft to the first obtuse marginal, reverse  saphenous vein graft to the posterior descending coronary artery with  right leg endovein harvesting.    Myovue 08/10/17 non ischemic    Duplex 02/27/14  Plaque no stenosis f/u again 02/2016         Risk Assessment/Calculations:         ASSESSMENT:    1. Chest pain, unspecified type   2. Coronary artery disease involving native coronary artery of native heart without angina pectoris   3. Essential hypertension   4. Hyperlipidemia, unspecified hyperlipidemia type   5. Left carotid bruit   6. Pre-diabetes      PLAN:  In order of problems listed above:  Chest pain 2 weeks ago that lasted a couple days eased with belching. He didn't try a NTG. No exertional symptoms. Somewhat atypical but with 66 yr old grafts will do an exercise myoview.  CAD status post CABG in 2012, low risk Myoview 2019, no angina, continue ASA, metoprolol and hyzaar 150 min exercise weekly, weight loss   Hypertension well controlled on Hyzaar and metoprolol   Hyperlipidemia LDL 58 07/27/21  on Zocor   Left carotid bruit in past with 1-39% stenosis 2019-will repeat  dopplers  Pre-diabetes A1C 7.0-seeing a nutritionist next month. Will give him a diabetic diet to follow    Shared Decision Making/Informed Consent   Shared Decision Making/Informed Consent The risks [chest pain, shortness of breath, cardiac arrhythmias, dizziness, blood pressure fluctuations, myocardial infarction, stroke/transient ischemic attack, nausea, vomiting, allergic reaction, radiation exposure, metallic taste sensation and life-threatening complications (estimated to be 1 in 10,000)], benefits (risk stratification, diagnosing coronary artery disease, treatment guidance) and alternatives of a nuclear stress test were discussed in detail with [name] [name] and he agrees to proceed.    Medication Adjustments/Labs and Tests Ordered: Current medicines are reviewed at length with the patient today.  Concerns regarding medicines are outlined above.  Medication changes, Labs and Tests ordered today are listed in the Patient Instructions below. Patient Instructions  Medication Instructions:  Your physician recommends that you continue on your current medications as directed. Please refer to the Current Medication list given to you today.  *If you need a refill on your cardiac medications before your next appointment, please call your pharmacy*   Lab Work: None If you have labs (blood work) drawn today and your tests are completely normal, you will receive your results only by: [place] (if you have MyChart) OR A paper copy in the mail If you have any lab test that is abnormal or we need to change your treatment, we will call you to review the results.   Testing/Procedures: Your physician has requested that you have a lexiscan myoview. For further information please visit [email or web address]. Please follow instruction sheet, as given.  Your physician has requested that you have a carotid duplex. This test is an ultrasound of the carotid arteries in your neck. It looks at blood  flow through these arteries that supply the brain with blood. Allow one hour for this exam. There are no restrictions or special instructions.  Follow-Up: At [facility], you and your health needs are our priority.  As part of our continuing mission to provide you with exceptional heart care, we have created designated Provider Care Teams.  These Care Teams include your primary Cardiologist (physician) and Advanced Practice Providers (APPs -  Physician Assistants and Nurse Practitioners) who all work together to provide you with the care you need, when you need it.   Your next appointment:   1 year(s)  The format for your next appointment:   In Person  Provider:   Jenkins Rouge, MD {   Other Instructions See letter for Stress Test Instructions  Important Information About Sugar       Preventing Diabetic Ketoacidosis Diabetic ketoacidosis (DKA) is a life-threatening complication of diabetes (diabetes mellitus). It develops when there is not enough of a hormone called insulin in the body. If the body does not have enough insulin, it cannot divide (break down) sugar (glucose) into usable cells, so it breaks down fats instead. This leads to the production of acids (ketones), which can cause the blood to have too much acid in it (acidosis). DKA is a medical emergency that must be treated at a hospital. You may be more likely to develop DKA if you have type 1 diabetes and you take insulin. You can prevent DKA by working closely with your health care provider to manage your diabetes. What nutrition changes can be made?  Follow your meal plan, as directed by your health care provider or diet and nutrition specialist (dietitian). Eat healthy meals at about the same time every day. Have healthy snacks between meals. Avoid not eating for long periods of time. Do not skip meals, especially if you are ill. Avoid regularly eating foods that contain a lot of sugar. Also avoid drinking alcohol.  Sugary food and alcohol increase your risk of high blood glucose (hyperglycemia), which increases your risk for DKA. Drink enough fluid to keep your urine pale yellow. Dehydration increases your risk for DKA. What actions can I take to lower my risk? To lower your risk for DKA, manage your diabetes as directed by your health care provider: Take insulin and other diabetes medicines as directed. Check your blood glucose every day, as often as directed. Make a sick day plan in advance with your health care provider. Follow your sick day plan whenever you cannot eat or drink as usual. Check your urine for ketones as often as directed. During times when you are sick, check your ketones every 4-6 hours. If you develop symptoms of DKA, check your ketones right away. If you have ketones in your urine: Contact your health care provider right away. Do not exercise. Know the symptoms of DKA so that you can get treatment as soon as possible. Make sure that people at work, home, and school know how to check your blood glucose, in case you are not able to do it yourself. Wear a medical alert bracelet or carry a card that says that you have diabetes and lists what medicines you take. Why are these changes important? Preventing high blood glucose and dehydration helps prevent DKA. You may need to work with your health care provider to adjust your diabetes management plan to lower your risk of developing DKA. DKA can lead to a serious medical emergency that can be life-threatening. Where to find support For more support with preventing DKA: Talk with your health care provider. Consider joining a support group. The American Diabetes Association has an online support  community at: community.diabetes.org Where to find more information Learn more about preventing DKA from: American Diabetes Association: diabetes.org Association of Diabetes Care & Education Specialists: diabeteseducator.org American Heart  Association: heart.org Contact a health care provider if: You develop symptoms of DKA, such as: Fatigue. Weight loss. Excessive thirst or excessive urination. Light-headedness or rapid breathing. Fruity or sweet-smelling breath. Vision changes, confusion, or irritability. Pain in the abdomen, nausea, or vomiting. Feeling warm in your face (flushed). This may or may not include a reddish color coming to your face. If you develop any of these symptoms, do not wait to see if the symptoms will go away. Get medical help right away. Call your local emergency services (911 in the U[place] Do not drive yourself to the hospital. Summary Preventing high blood glucose and dehydration helps prevent DKA. You may need to work with your health care provider to adjust your diabetes management plan to lower your risk of developing DKA. Check your urine for ketones as often as directed. You may need to check more often when your blood glucose level is high and when you are ill. DKA is a medical emergency. Make sure you know the symptoms so that you can get treatment right away. This information is not intended to replace advice given to you by your health care provider. Make sure you discuss any questions you have with your health care provider. Document Revised: 10/30/2019 Document Reviewed: 10/30/2019 Elsevier Patient Education  [place], Ermalinda Barrios, [place]  08/18/2021 8:23 AM    [facility] [place], [place], [place]  [address] Phone: [phone]; Fax: [phone]

## 2021-08-16 NOTE — Telephone Encounter (Signed)
Patient called. He is returning a call to [place].

## 2021-08-18 ENCOUNTER — Ambulatory Visit (INDEPENDENT_AMBULATORY_CARE_PROVIDER_SITE_OTHER): Payer: 59 | Admitting: Physician Assistant

## 2021-08-18 ENCOUNTER — Encounter: Payer: Self-pay | Admitting: Physician Assistant

## 2021-08-18 VITALS — BP 122/66 | HR 48 | Ht 68.0 in | Wt 197.8 lb

## 2021-08-18 DIAGNOSIS — E785 Hyperlipidemia, unspecified: Secondary | ICD-10-CM | POA: Diagnosis not present

## 2021-08-18 DIAGNOSIS — I2510 Atherosclerotic heart disease of native coronary artery without angina pectoris: Secondary | ICD-10-CM | POA: Diagnosis not present

## 2021-08-18 DIAGNOSIS — R0989 Other specified symptoms and signs involving the circulatory and respiratory systems: Secondary | ICD-10-CM

## 2021-08-18 DIAGNOSIS — R7303 Prediabetes: Secondary | ICD-10-CM

## 2021-08-18 DIAGNOSIS — R079 Chest pain, unspecified: Secondary | ICD-10-CM | POA: Diagnosis not present

## 2021-08-18 DIAGNOSIS — I10 Essential (primary) hypertension: Secondary | ICD-10-CM | POA: Diagnosis not present

## 2021-08-18 NOTE — Patient Instructions (Addendum)
Medication Instructions:  Your physician recommends that you continue on your current medications as directed. Please refer to the Current Medication list given to you today.  *If you need a refill on your cardiac medications before your next appointment, please call your pharmacy*   Lab Work: None If you have labs (blood work) drawn today and your tests are completely normal, you will receive your results only by: [place] (if you have MyChart) OR A paper copy in the mail If you have any lab test that is abnormal or we need to change your treatment, we will call you to review the results.   Testing/Procedures: Your physician has requested that you have a lexiscan myoview. For further information please visit HugeFiesta.[email or web address]. Please follow instruction sheet, as given.  Your physician has requested that you have a carotid duplex. This test is an ultrasound of the carotid arteries in your neck. It looks at blood flow through these arteries that supply the brain with blood. Allow one hour for this exam. There are no restrictions or special instructions.   Follow-Up: At [facility], you and your health needs are our priority.  As part of our continuing mission to provide you with exceptional heart care, we have created designated Provider Care Teams.  These Care Teams include your primary Cardiologist (physician) and Advanced Practice Providers (APPs -  Physician Assistants and Nurse Practitioners) who all work together to provide you with the care you need, when you need it.   Your next appointment:   1 year(s)  The format for your next appointment:   In Person  Provider:   Jenkins Rouge, MD {   Other Instructions See letter for Stress Test Instructions  Important Information About Sugar       Preventing Diabetic Ketoacidosis Diabetic ketoacidosis (DKA) is a life-threatening complication of diabetes (diabetes mellitus). It develops when there is not enough of a  hormone called insulin in the body. If the body does not have enough insulin, it cannot divide (break down) sugar (glucose) into usable cells, so it breaks down fats instead. This leads to the production of acids (ketones), which can cause the blood to have too much acid in it (acidosis). DKA is a medical emergency that must be treated at a hospital. You may be more likely to develop DKA if you have type 1 diabetes and you take insulin. You can prevent DKA by working closely with your health care provider to manage your diabetes. What nutrition changes can be made?  Follow your meal plan, as directed by your health care provider or diet and nutrition specialist (dietitian). Eat healthy meals at about the same time every day. Have healthy snacks between meals. Avoid not eating for long periods of time. Do not skip meals, especially if you are ill. Avoid regularly eating foods that contain a lot of sugar. Also avoid drinking alcohol. Sugary food and alcohol increase your risk of high blood glucose (hyperglycemia), which increases your risk for DKA. Drink enough fluid to keep your urine pale yellow. Dehydration increases your risk for DKA. What actions can I take to lower my risk? To lower your risk for DKA, manage your diabetes as directed by your health care provider: Take insulin and other diabetes medicines as directed. Check your blood glucose every day, as often as directed. Make a sick day plan in advance with your health care provider. Follow your sick day plan whenever you cannot eat or drink as usual. Check your urine for ketones  as often as directed. During times when you are sick, check your ketones every 4-6 hours. If you develop symptoms of DKA, check your ketones right away. If you have ketones in your urine: Contact your health care provider right away. Do not exercise. Know the symptoms of DKA so that you can get treatment as soon as possible. Make sure that people at work, home,  and school know how to check your blood glucose, in case you are not able to do it yourself. Wear a medical alert bracelet or carry a card that says that you have diabetes and lists what medicines you take. Why are these changes important? Preventing high blood glucose and dehydration helps prevent DKA. You may need to work with your health care provider to adjust your diabetes management plan to lower your risk of developing DKA. DKA can lead to a serious medical emergency that can be life-threatening. Where to find support For more support with preventing DKA: Talk with your health care provider. Consider joining a support group. The American Diabetes Association has an online support community at: community.diabetes.org Where to find more information Learn more about preventing DKA from: American Diabetes Association: diabetes.org Association of Diabetes Care & Education Specialists: diabeteseducator.org American Heart Association: heart.org Contact a health care provider if: You develop symptoms of DKA, such as: Fatigue. Weight loss. Excessive thirst or excessive urination. Light-headedness or rapid breathing. Fruity or sweet-smelling breath. Vision changes, confusion, or irritability. Pain in the abdomen, nausea, or vomiting. Feeling warm in your face (flushed). This may or may not include a reddish color coming to your face. If you develop any of these symptoms, do not wait to see if the symptoms will go away. Get medical help right away. Call your local emergency services (911 in the [place] Do not drive yourself to the hospital. Summary Preventing high blood glucose and dehydration helps prevent DKA. You may need to work with your health care provider to adjust your diabetes management plan to lower your risk of developing DKA. Check your urine for ketones as often as directed. You may need to check more often when your blood glucose level is high and when you are ill. DKA is a  medical emergency. Make sure you know the symptoms so that you can get treatment right away. This information is not intended to replace advice given to you by your health care provider. Make sure you discuss any questions you have with your health care provider. Document Revised: 10/30/2019 Document Reviewed: 10/30/2019 Elsevier Patient Education  [place].

## 2021-08-18 NOTE — Telephone Encounter (Signed)
I spoke with pt and he said this happens randomly throughout the day. It is not racing or palpitations he can just hear it in his Left ear.  After speaking with Ermalinda Barrios, I advised pt to try taking an OTC congestant for a few days to see if it helps. If not, he can f/u with his PCP.

## 2021-08-18 NOTE — Addendum Note (Signed)
Addended by: Carylon Perches on: 08/18/2021 08:46 AM   Modules accepted: Orders

## 2021-08-18 NOTE — Telephone Encounter (Signed)
LM for pt to return my call

## 2021-08-23 ENCOUNTER — Encounter: Payer: 59 | Admitting: Physical Medicine and Rehabilitation

## 2021-08-27 ENCOUNTER — Telehealth (HOSPITAL_COMMUNITY): Payer: Self-pay | Admitting: *Deleted

## 2021-08-27 NOTE — Telephone Encounter (Signed)
Close encounter 

## 2021-08-28 ENCOUNTER — Other Ambulatory Visit: Payer: Self-pay | Admitting: Internal Medicine

## 2021-08-30 ENCOUNTER — Other Ambulatory Visit: Payer: Self-pay | Admitting: Physician Assistant

## 2021-08-31 ENCOUNTER — Ambulatory Visit (HOSPITAL_COMMUNITY)
Admission: RE | Admit: 2021-08-31 | Discharge: 2021-08-31 | Disposition: A | Discharge status: Home / Self Care | Payer: 59 | Source: Ambulatory Visit | Attending: Cardiovascular Disease | Admitting: Cardiovascular Disease

## 2021-08-31 ENCOUNTER — Ambulatory Visit (HOSPITAL_BASED_OUTPATIENT_CLINIC_OR_DEPARTMENT_OTHER)
Admission: RE | Admit: 2021-08-31 | Discharge: 2021-08-31 | Disposition: A | Discharge status: Home / Self Care | Payer: 59 | Source: Ambulatory Visit | Attending: Cardiovascular Disease | Admitting: Cardiovascular Disease

## 2021-08-31 DIAGNOSIS — R079 Chest pain, unspecified: Secondary | ICD-10-CM | POA: Insufficient documentation

## 2021-08-31 DIAGNOSIS — R0989 Other specified symptoms and signs involving the circulatory and respiratory systems: Secondary | ICD-10-CM | POA: Insufficient documentation

## 2021-08-31 LAB — MYOCARDIAL PERFUSION IMAGING
LV dias vol: 114 mL (ref 62–150)
LV sys vol: 51 mL
Nuc Stress EF: 55 %
Peak HR: 71 {beats}/min
Rest HR: 46 {beats}/min
Rest Nuclear Isotope Dose: 10.8 mCi
SDS: 1
SRS: 1
SSS: 2
ST Depression (mm): 0 mm
Stress Nuclear Isotope Dose: 32.6 mCi
TID: 1.13

## 2021-08-31 MED ORDER — REGADENOSON 0.4 MG/5ML IV SOLN
0.4000 mg | Freq: Once | INTRAVENOUS | Status: AC
  Administered 2021-08-31: 0.4 mg via INTRAVENOUS

## 2021-08-31 MED ORDER — TECHNETIUM TC 99M TETROFOSMIN IV KIT
32.6000 | PACK | Freq: Once | INTRAVENOUS | Status: AC | PRN
Start: 2021-08-31 — End: 2021-08-31
  Administered 2021-08-31: 32.6 via INTRAVENOUS

## 2021-08-31 MED ORDER — TECHNETIUM TC 99M TETROFOSMIN IV KIT
10.8000 | PACK | Freq: Once | INTRAVENOUS | Status: AC | PRN
  Administered 2021-08-31: 10.8 via INTRAVENOUS

## 2021-09-02 ENCOUNTER — Ambulatory Visit: Payer: 59

## 2021-09-03 ENCOUNTER — Telehealth: Payer: Self-pay

## 2021-09-03 NOTE — Telephone Encounter (Signed)
Pt is requesting a refill on: losartan-hydrochlorothiazide (HYZAAR) 100-25 MG tablet  Pharmacy: [facility] [id number] - [address], [place] 07/27/21 ROV 10/29/21

## 2021-09-05 ENCOUNTER — Other Ambulatory Visit: Payer: Self-pay | Admitting: Cardiovascular Disease

## 2021-09-05 ENCOUNTER — Other Ambulatory Visit: Payer: Self-pay | Admitting: Internal Medicine

## 2021-09-06 ENCOUNTER — Other Ambulatory Visit: Payer: Self-pay

## 2021-09-07 ENCOUNTER — Other Ambulatory Visit: Payer: Self-pay | Admitting: *Deleted

## 2021-09-07 MED ORDER — SIMVASTATIN 40 MG PO TABS
40.0000 mg | ORAL_TABLET | Freq: Every day | ORAL | 12 refills | Status: DC
Start: 2021-09-07 — End: 2022-09-15

## 2021-09-09 ENCOUNTER — Ambulatory Visit: Payer: 59

## 2021-10-14 ENCOUNTER — Encounter: Payer: 59 | Attending: Internal Medicine | Admitting: Skilled Nursing Facility1

## 2021-10-14 DIAGNOSIS — E119 Type 2 diabetes mellitus without complications: Secondary | ICD-10-CM | POA: Diagnosis present

## 2021-10-16 ENCOUNTER — Encounter: Payer: Self-pay | Admitting: Internal Medicine

## 2021-10-18 ENCOUNTER — Encounter: Payer: Self-pay | Admitting: Skilled Nursing Facility1

## 2021-10-18 NOTE — Progress Notes (Signed)
Patient was seen on 10/14/2021 for the first of a series of three diabetes self-management courses [facility].  Patient Education Plan per assessed needs and concerns is to attend three course education program for Diabetes Self Management Education.  Pt arrives with his very supportive wife.   A1C was 7.0   The following learning objectives were met by the patient during this class: Describe diabetes, types of diabetes and pathophysiology State some common risk factors for diabetes Defines the role of glucose and insulin Describe the relationship between diabetes and cardiovascular and other risks State the members of the Healthcare Team States the rationale for glucose monitoring and when to test State their individual [place] the importance of logging glucose readings and how to interpret the readings Identifies A1C target Explain the correlation between A1c and eAG values State symptoms and treatment of high blood glucose and low blood glucose Explain proper technique for glucose testing and identify proper sharps disposal  Handouts given during class include: How to Thrive:  A Guide for Your Journey with Diabetes by the ADA Meal Plan Card and carbohydrate content list Dietary intake form Low Sodium Flavoring Tips Types of Fats Dining Out Label reading Snack list The diabetes portion plate Diabetes Resources A1c to eAG Conversion Chart Blood Glucose Log Diabetes Recommended Care Schedule Support Group Diabetes Success Plan Core Class Satisfaction Survey   Follow-Up Plan: Attend core 2

## 2021-10-20 ENCOUNTER — Telehealth: Payer: Self-pay | Admitting: Internal Medicine

## 2021-10-21 ENCOUNTER — Encounter: Payer: 59 | Attending: Internal Medicine | Admitting: Skilled Nursing Facility1

## 2021-10-21 ENCOUNTER — Encounter: Payer: Self-pay | Admitting: Skilled Nursing Facility1

## 2021-10-21 DIAGNOSIS — E119 Type 2 diabetes mellitus without complications: Secondary | ICD-10-CM | POA: Insufficient documentation

## 2021-10-21 NOTE — Progress Notes (Signed)

## 2021-10-25 ENCOUNTER — Other Ambulatory Visit: Payer: Self-pay | Admitting: *Deleted

## 2021-10-25 DIAGNOSIS — E119 Type 2 diabetes mellitus without complications: Secondary | ICD-10-CM

## 2021-10-25 DIAGNOSIS — R7303 Prediabetes: Secondary | ICD-10-CM

## 2021-10-25 MED ORDER — ONETOUCH ULTRA 2 W/DEVICE KIT: PACK | 0 refills | Status: AC

## 2021-10-25 NOTE — Telephone Encounter (Signed)
Sent glucose monitor to pharmacy.

## 2021-10-25 NOTE — Telephone Encounter (Signed)
error 

## 2021-10-25 NOTE — Telephone Encounter (Signed)
Patient needs the glucose monitor that Dr. Sharlet Salina ok'd.  Please send the monitor to Kristopher Oppenheim at [facility], [place]

## 2021-10-28 ENCOUNTER — Encounter: Payer: Self-pay | Admitting: Dietician

## 2021-10-28 ENCOUNTER — Encounter: Payer: 59 | Attending: Internal Medicine | Admitting: Dietician

## 2021-10-28 DIAGNOSIS — Z713 Dietary counseling and surveillance: Secondary | ICD-10-CM | POA: Insufficient documentation

## 2021-10-28 DIAGNOSIS — E119 Type 2 diabetes mellitus without complications: Secondary | ICD-10-CM | POA: Insufficient documentation

## 2021-10-28 NOTE — Progress Notes (Signed)
Patient was seen on 10/28/21 for the third of a series of three diabetes self-management courses at the [facility].   State the amount of activity recommended for healthy living Describe activities suitable for individual needs Identify ways to regularly incorporate activity into daily life Identify barriers to activity and ways to over come these barriers Identify diabetes medications being personally used and their primary action for lowering glucose and possible side effects Describe role of stress on blood glucose and develop strategies to address psychosocial issues Identify diabetes complications and ways to prevent them Explain how to manage diabetes during illness Evaluate success in meeting personal goal Establish 2-3 goals that they will plan to diligently work on  Goals:  I will count my carb choices at most meals and snacks I will be active 30 minutes or more 3 times a week I will take my diabetes medications as scheduled I will eat less unhealthy fats by eating less cereal bars, nabs, chips  Your patient has identified these potential barriers to change:  Motivation Finances Stress Lack of Family Support  Your patient has identified their diabetes self-care support plan as  Family Forensic scientist Resources [facility] Support Group  American Diabetes Association Website    Plan:  Attend Support Group as desired

## 2021-10-29 ENCOUNTER — Ambulatory Visit: Payer: 59 | Admitting: Internal Medicine

## 2021-11-03 ENCOUNTER — Encounter: Payer: Self-pay | Admitting: Internal Medicine

## 2021-11-03 ENCOUNTER — Ambulatory Visit (INDEPENDENT_AMBULATORY_CARE_PROVIDER_SITE_OTHER): Payer: 59 | Admitting: Internal Medicine

## 2021-11-03 ENCOUNTER — Ambulatory Visit: Payer: 59 | Admitting: Internal Medicine

## 2021-11-03 VITALS — BP 120/70 | HR 54 | Temp 97.8°F | Ht 68.0 in | Wt 186.0 lb

## 2021-11-03 DIAGNOSIS — R7303 Prediabetes: Secondary | ICD-10-CM | POA: Diagnosis not present

## 2021-11-03 DIAGNOSIS — Z23 Encounter for immunization: Secondary | ICD-10-CM | POA: Diagnosis not present

## 2021-11-03 LAB — POCT GLYCOSYLATED HEMOGLOBIN (HGB A1C): HbA1c POC (<> result, manual entry): 6.1 % (ref 4.0–5.6)

## 2021-11-03 NOTE — Patient Instructions (Addendum)
Your HgA1c is 6.1.  If you want to check the sugars you can do that first thing in the morning a few times a week. The goal would be less than 120 on average.

## 2021-11-03 NOTE — Progress Notes (Signed)
   Subjective:   Patient ID: [name] [name], male    DOB: [birth year], 66 y.o.   MRN: [id number]  HPI The patient is a 66 YO man coming in for follow up pre-diabetes. Met with nutrition. Down 15 pounds.   Review of Systems  Constitutional: Negative.   HENT: Negative.    Eyes: Negative.   Respiratory:  Negative for cough, chest tightness and shortness of breath.   Cardiovascular:  Negative for chest pain, palpitations and leg swelling.  Gastrointestinal:  Negative for abdominal distention, abdominal pain, constipation, diarrhea, nausea and vomiting.  Musculoskeletal: Negative.   Skin: Negative.   Neurological: Negative.   Psychiatric/Behavioral: Negative.      Objective:  Physical Exam Constitutional:      Appearance: Normal appearance.  HENT:     Head: Normocephalic.  Cardiovascular:     Rate and Rhythm: Normal rate.  Pulmonary:     Effort: Pulmonary effort is normal.  Musculoskeletal:        General: Normal range of motion.  Skin:    General: Skin is warm and dry.  Neurological:     General: No focal deficit present.     Mental Status: He is alert and oriented to person, place, and time.     Vitals:   11/03/21 0913  BP: 120/70  Pulse: (!) 54  Temp: 97.8 F (36.6 C)  TempSrc: Oral  SpO2: 93%  Weight: 186 lb (84.4 kg)  Height: $Remove'5\' 8"'yDjlPvy$  (1.727 m)    Assessment & Plan:  Flu shot given at visit

## 2021-11-03 NOTE — Assessment & Plan Note (Signed)
POC HgA1c done today in office 6.1. He has lost 15 pounds and made significant lifestyle changes. Encouraged follow up 6 months for ongoing monitoring.

## 2021-12-20 ENCOUNTER — Telehealth: Payer: Self-pay | Admitting: Internal Medicine

## 2021-12-20 ENCOUNTER — Other Ambulatory Visit: Payer: Self-pay

## 2021-12-20 MED ORDER — LOSARTAN POTASSIUM-HCTZ 100-25 MG PO TABS: 1.0000 | ORAL_TABLET | Freq: Every day | ORAL | 0 refills | Status: DC

## 2021-12-20 NOTE — Telephone Encounter (Signed)
Patient called stating that he needs a refill on his medication losartan-hydrochlorothiazide (HYZAAR) 100-25 MG tablet sent to his pharmacy  [facility] [id number] - [place], [place]  . Patient stated that the pharmacy was able to give him 4 pills until they hear back from Dr. Sharlet Salina about his refill request.

## 2021-12-29 ENCOUNTER — Other Ambulatory Visit: Payer: Self-pay

## 2021-12-29 MED ORDER — LOSARTAN POTASSIUM-HCTZ 100-25 MG PO TABS: 1.0000 | ORAL_TABLET | Freq: Every day | ORAL | 0 refills | Status: DC

## 2022-01-12 ENCOUNTER — Encounter: Payer: Self-pay | Admitting: Skilled Nursing Facility1

## 2022-01-12 ENCOUNTER — Encounter: Payer: Medicare Other | Attending: Internal Medicine | Admitting: Skilled Nursing Facility1

## 2022-01-12 VITALS — Ht 68.0 in | Wt 179.2 lb

## 2022-01-12 DIAGNOSIS — E119 Type 2 diabetes mellitus without complications: Secondary | ICD-10-CM | POA: Insufficient documentation

## 2022-01-12 NOTE — Progress Notes (Signed)
Appointment start:  8:14 am Appointment End:  8:45 am  Patient attended Diabetes Core Classes 1-3 between 10/14/2021 and 10/28/2021 at [facility]. The purpose of the meeting today is to review information learned during those classes as well as review patient application and goals.   What are one or two positive things that you are doing right now to manage your diabetes? Diet changes and physical activity.  What is the hardest part about your diabetes right now, causing you the most concern, or is the most worrisome to you about your diabetes?  Checking blood sugar levels regularly.  What questions do you have today? No questions as of today.  Have you participated in any diabetes support group? No.  History: cardiac surgery  A1C:  6.1% (11/03/21) Medications include: See list. Sleep:  no concerns. Weight:  179.2 lb. Blood Glucose:  Fasting:  Not testing regularly.   2 hours after starting a meal: Not testing regularly.   Have you had any low blood sugar readings in the past month? Not testing regularly.   Social History: Supportive wife. Exercise:  walking  24 hour diet recall: Breakfast: breakfast bar or cheerios with a banana Snack: None reported. Lunch: turkey/ham sandwich and cheese Snack: None reported.  Dinner: meat and vegetables, pot roast with vegetables  Snack: None reported. Beverages: water  Pt states he has stopped eating fast food as much and is drinking a lot more water. Pt states he recently retired. Pt states he rarely drinks sodas and juice. Pt states he is still walking regularly and is making better food choices.  Pt states he usually doesn't eat three meals a day. Pt states his diet changed a lot since his heart surgery. Pt states he recently moved and he has not been able to take his blood sugars regularly. Pt states he feels like he doesn't eat enough fruit throughout the day.   Specific focus but not limited to the  following: Review of blood glucose monitoring and interpretation including the recommended target ranges and Hgb A1c.  Review of carbohydrate counting, importance of regularly scheduled meals/snacks, and meal planning to improve quality of diet. Review of the effects of physical activity on glucose levels and long-term glucose control.  Recommended goal of 150 minutes of physical activity/week. Review of patient medications and discussed role of medication on blood glucose and possible side effects. Discussion of strategies to manage stress, psychosocial issues, and other obstacles to diabetes management. Review of short-term complications: hyper- and hypo-glycemia (causes, symptoms, and treatment options) Review of prevention, detection, and treatment of long-term complications.  Discussion of the role of prolonged elevated glucose levels on body systems.  Continuing Goals: Start taking glucose levels on a daily basis.  Continue eating a nutritious diet and make sure to eat consistently throughout the day. Continue to keep physical activity as a part of your daily routine.    Future Follow up: Follow-up with dietitian as needed.

## 2022-01-21 ENCOUNTER — Telehealth: Payer: Self-pay | Admitting: Internal Medicine

## 2022-01-21 NOTE — Telephone Encounter (Signed)
Patient called and would like a copy of his vaccination records printed out.  He would like to pick it up on Monday, 01/24/2021.  Please leave it at the front desk.

## 2022-01-21 NOTE — Telephone Encounter (Signed)
Immunization report has been printed and left up front for pt to pick up.

## 2022-02-03 ENCOUNTER — Encounter: Payer: Self-pay | Admitting: Family Medicine

## 2022-02-03 ENCOUNTER — Telehealth (INDEPENDENT_AMBULATORY_CARE_PROVIDER_SITE_OTHER): Payer: Medicare Other | Admitting: Family Medicine

## 2022-02-03 DIAGNOSIS — R051 Acute cough: Secondary | ICD-10-CM

## 2022-02-03 DIAGNOSIS — R6883 Chills (without fever): Secondary | ICD-10-CM

## 2022-02-03 DIAGNOSIS — U071 COVID-19: Secondary | ICD-10-CM | POA: Diagnosis not present

## 2022-02-03 MED ORDER — NIRMATRELVIR/RITONAVIR (PAXLOVID)TABLET: 3.0000 | ORAL_TABLET | Freq: Two times a day (BID) | ORAL | 0 refills | Status: AC

## 2022-02-03 NOTE — Progress Notes (Signed)
MyChart Video Visit    Virtual Visit via Video Note   This visit type was conducted due to national recommendations for restrictions regarding the COVID-19 Pandemic (e.g. social distancing) in an effort to limit this patient's exposure and mitigate transmission in our community. This patient is at least at moderate risk for complications without adequate follow up. This format is felt to be most appropriate for this patient at this time. Physical exam was limited by quality of the video and audio technology used for the visit. CMA was able to get the patient set up on a video visit.  Patient location: Home. Patient and provider in visit Provider location: Office  I discussed the limitations of evaluation and management by telemedicine and the availability of in person appointments. The patient expressed understanding and agreed to proceed.  Visit Date: 02/03/2022  Today's healthcare provider: Harland Dingwall, NP-C     Subjective:    Patient ID: [name] [name], male    DOB: 26-Oct-1955, 67 y.o.   MRN: [id number]  Chief Complaint  Patient presents with   Covid Positive    Tested positive 1/25 Sx include: nasal congestion, cough and chills    HPI Symptom onset yesterday. Chills, congestion and cough.   Positive Covid test today.   First time having Covid.  He is fully vaccinated.   Does not smoke.  No hx of lung disease.       Past Medical History:  Diagnosis Date   Arthritis    Blood clot in vein    superficial / right leg following CABG   CAD (coronary artery disease)    Chest pain    Colon polyps    Complication of anesthesia    hard to wake up   Essential hypertension, benign    Headache    migraines as teenager   Heart murmur    History of blood transfusion    History of kidney stones    Hyperlipemia    Obesity    Tobacco abuse     Past Surgical History:  Procedure Laterality Date   CARDIAC CATHETERIZATION     CIRCUMCISION  1986   CORONARY  ARTERY BYPASS GRAFT     Coronary artery bypass grafting x4  10/21/2010   CYSTOSCOPY WITH RETROGRADE PYELOGRAM, URETEROSCOPY AND STENT PLACEMENT Left 09/01/2014   Procedure: CYSTOSCOPY WITH RETROGRADE PYELOGRAM, URETEROSCOPY AND STENT PLACEMENT;  Surgeon: Irine Seal, MD;  Location: [facility];  Service: Urology;  Laterality: Left;   CYSTOSCOPY WITH URETEROSCOPY AND STENT PLACEMENT Left 09/09/2014   Procedure: CYSTOSCOPY,LEFT URETEROSCOPY AND STENT PLACEMENT;  Surgeon: Irine Seal, MD;  Location: [facility];  Service: Urology;  Laterality: Left;   HEMORRHOID SURGERY     HOLMIUM LASER APPLICATION Left 06/07/4130   Procedure: WITH HOLMIUM LASER ;  Surgeon: Irine Seal, MD;  Location: [facility];  Service: Urology;  Laterality: Left;   Stapled hemorrhoidopexy.  10/19/2006   TOTAL HIP ARTHROPLASTY Left 05/29/2015   Procedure: LEFT TOTAL HIP ARTHROPLASTY ANTERIOR APPROACH;  Surgeon: Mcarthur Rossetti, MD;  Location: [facility];  Service: Orthopedics;  Laterality: Left;   VASECTOMY  1986    Family History  Problem Relation Age of Onset   Hypertension Father    Leukemia Mother    Hypertension Mother    Colon cancer Neg Hx     Social History   Socioeconomic History   Marital status: Married    Spouse name: Not on file   Number of children: Not on file  Years of education: Not on file   Highest education level: Not on file  Occupational History   Occupation: [employer]  Tobacco Use   Smoking status: Former    Packs/day: 0.50    Years: 7.00    Total pack years: 3.50    Types: Cigarettes    Quit date: 01/10/1981    Years since quitting: 41.0   Smokeless tobacco: Never  Vaping Use   Vaping Use: Never used  Substance and Sexual Activity   Alcohol use: No    Alcohol/week: 0.0 standard drinks of alcohol   Drug use: No   Sexual activity: Not on file  Other Topics Concern   Not on file  Social History Narrative   Semi aerobic workouts   Social Determinants of Health   Financial Resource Strain:  Not on file  Food Insecurity: Not on file  Transportation Needs: Not on file  Physical Activity: Not on file  Stress: Not on file  Social Connections: Not on file  Intimate Partner Violence: Not on file    Outpatient Medications Prior to Visit  Medication Sig Dispense Refill   acetaminophen (TYLENOL) 650 MG CR tablet Take 650 mg by mouth every 8 (eight) hours as needed for pain.     aspirin EC 81 MG tablet Take 1 tablet (81 mg total) by mouth daily.     Blood Glucose Monitoring Suppl (ONE TOUCH ULTRA 2) w/Device KIT Check blood sugar once daily. 1 kit 0   Camphor-Menthol-Methyl Sal (SALONPAS) 3.01-16-08 % PTCH Apply 1 patch topically daily as needed (back pain).     clotrimazole (LOTRIMIN) 1 % cream APPLY TO AFFECTED AREA(S) TWO TIMES A DAY 30 g 11   cyclobenzaprine (FLEXERIL) 10 MG tablet Take 1 tablet (10 mg total) by mouth 3 (three) times daily as needed for muscle spasms. 30 tablet 0   ezetimibe (ZETIA) 10 MG tablet TAKE ONE TABLET BY MOUTH DAILY 90 tablet 3   losartan-hydrochlorothiazide (HYZAAR) 100-25 MG tablet Take 1 tablet by mouth daily. 90 tablet 0   meclizine (ANTIVERT) 25 MG tablet Take 1 tablet (25 mg total) by mouth 3 (three) times daily as needed for dizziness. 30 tablet 0   Menthol, Topical Analgesic, (BENGAY EX) Apply 1 application topically daily as needed (back pain).     metoprolol tartrate (LOPRESSOR) 25 MG tablet Take 1 tablet (25 mg total) by mouth 2 (two) times daily. 180 tablet 3   nitroGLYCERIN (NITROSTAT) 0.4 MG SL tablet Place 1 tablet (0.4 mg total) under the tongue every 5 (five) minutes as needed for chest pain. 25 tablet 3   simvastatin (ZOCOR) 40 MG tablet Take 1 tablet (40 mg total) by mouth daily at 6 PM. 60 tablet 12   No facility-administered medications prior to visit.    Allergies  Allergen Reactions   Codeine Other (See Comments)    migraines    ROS     Objective:    Physical Exam  There were no vitals taken for this visit. Wt Readings  from Last 3 Encounters:  01/12/22 179 lb 3.2 oz (81.3 kg)  11/03/21 186 lb (84.4 kg)  10/18/21 185 lb 8 oz (84.1 kg)   Alert and oriented and in no acute distress.  Respirations unlabored.  Speaking in complete sentences without difficulty.     Assessment & Plan:   Problem List Items Addressed This Visit   None Visit Diagnoses     COVID-19 virus infection    -  Primary   Relevant  Medications   nirmatrelvir/ritonavir (PAXLOVID) 20 x 150 MG & 10 x '100MG'$  TABS   Chills       Acute cough          Paxlovid prescribed.  Discussed how to take the medication and potential side effects.  He will stop statin therapy while taking this medication.  Encouraged symptomatic management.  Counseling on guidelines for quarantine and isolation.  He will follow-up if worsening or if he has any concerns.  I am having [name] [name] start on nirmatrelvir/ritonavir. I am also having him maintain his acetaminophen, aspirin EC, Salonpas, (Menthol, Topical Analgesic, (BENGAY EX)), nitroGLYCERIN, clotrimazole, metoprolol tartrate, cyclobenzaprine, meclizine, ezetimibe, simvastatin, ONE TOUCH ULTRA 2, and losartan-hydrochlorothiazide.  Meds ordered this encounter  Medications   nirmatrelvir/ritonavir (PAXLOVID) 20 x 150 MG & 10 x '100MG'$  TABS    Sig: Take 3 tablets by mouth 2 (two) times daily for 5 days. (Take nirmatrelvir 150 mg two tablets twice daily for 5 days and ritonavir 100 mg one tablet twice daily for 5 days) Patient GFR is >60    Dispense:  30 tablet    Refill:  0    Order Specific Question:   Supervising Provider    Answer:   Pricilla Holm A [[id number]]    I discussed the assessment and treatment plan with the patient. The patient was provided an opportunity to ask questions and all were answered. The patient agreed with the plan and demonstrated an understanding of the instructions.   The patient was advised to call back or seek an in-person evaluation if the symptoms worsen or if the  condition fails to improve as anticipated.  I provided 17 minutes of face-to-face time during this encounter.   Harland Dingwall, NP-C [facility] [place] [phone] (phone) [phone] (fax)  [place]

## 2022-03-17 ENCOUNTER — Encounter: Payer: Self-pay | Admitting: Radiology

## 2022-04-12 DIAGNOSIS — K080 Exfoliation of teeth due to systemic causes: Secondary | ICD-10-CM | POA: Diagnosis not present

## 2022-05-05 ENCOUNTER — Ambulatory Visit: Payer: 59 | Admitting: Internal Medicine

## 2022-05-06 ENCOUNTER — Encounter: Payer: Self-pay | Admitting: Internal Medicine

## 2022-05-06 ENCOUNTER — Ambulatory Visit (INDEPENDENT_AMBULATORY_CARE_PROVIDER_SITE_OTHER): Payer: Medicare Other | Admitting: Internal Medicine

## 2022-05-06 VITALS — BP 122/70 | HR 54 | Temp 98.0°F | Ht 68.0 in | Wt 177.0 lb

## 2022-05-06 DIAGNOSIS — E782 Mixed hyperlipidemia: Secondary | ICD-10-CM

## 2022-05-06 DIAGNOSIS — I10 Essential (primary) hypertension: Secondary | ICD-10-CM | POA: Diagnosis not present

## 2022-05-06 DIAGNOSIS — R7303 Prediabetes: Secondary | ICD-10-CM | POA: Diagnosis not present

## 2022-05-06 DIAGNOSIS — R42 Dizziness and giddiness: Secondary | ICD-10-CM | POA: Diagnosis not present

## 2022-05-06 LAB — LIPID PANEL
Cholesterol: 107 mg/dL (ref 0–200)
HDL: 39.6 mg/dL (ref >39.00)
LDL Cholesterol: 54 mg/dL (ref 0–99)
NonHDL: 67.03
Total CHOL/HDL Ratio: 3
Triglycerides: 63 mg/dL (ref 0.0–149.0)
VLDL: 12.6 mg/dL (ref 0.0–40.0)

## 2022-05-06 LAB — CBC
HCT: 35.2 % — ABNORMAL LOW (ref 39.0–52.0)
Hemoglobin: 12 g/dL — ABNORMAL LOW (ref 13.0–17.0)
MCHC: 34.2 g/dL (ref 30.0–36.0)
MCV: 94.3 fl (ref 78.0–100.0)
Platelets: 239 10*3/uL (ref 150.0–400.0)
RBC: 3.73 Mil/uL — ABNORMAL LOW (ref 4.22–5.81)
RDW: 13.1 % (ref 11.5–15.5)
WBC: 5.8 10*3/uL (ref 4.0–10.5)

## 2022-05-06 LAB — HEMOGLOBIN A1C: Hgb A1c MFr Bld: 6.5 % (ref 4.6–6.5)

## 2022-05-06 LAB — COMPREHENSIVE METABOLIC PANEL
ALT: 42 U/L (ref 0–53)
AST: 32 U/L (ref 0–37)
Albumin: 4.6 g/dL (ref 3.5–5.2)
Alkaline Phosphatase: 37 U/L — ABNORMAL LOW (ref 39–117)
BUN: 27 mg/dL — ABNORMAL HIGH (ref 6–23)
CO2: 30 mEq/L (ref 19–32)
Calcium: 9.9 mg/dL (ref 8.4–10.5)
Chloride: 102 mEq/L (ref 96–112)
Creatinine, Ser: 1.18 mg/dL (ref 0.40–1.50)
GFR: 63.75 mL/min (ref >60.00)
Glucose, Bld: 106 mg/dL — ABNORMAL HIGH (ref 70–99)
Potassium: 4.2 mEq/L (ref 3.5–5.1)
Sodium: 140 mEq/L (ref 135–145)
Total Bilirubin: 0.5 mg/dL (ref 0.2–1.2)
Total Protein: 7.6 g/dL (ref 6.0–8.3)

## 2022-05-06 MED ORDER — MECLIZINE HCL 25 MG PO TABS: 25.0000 mg | ORAL_TABLET | Freq: Three times a day (TID) | ORAL | 0 refills | Status: AC | PRN

## 2022-05-06 NOTE — Assessment & Plan Note (Signed)
BP at goal on losartan/hctz 100/25 mg daily and metoprolol 25 mg BID. Checking CMP and adjust as needed.

## 2022-05-06 NOTE — Assessment & Plan Note (Signed)
Checking HgA1c today and adjust as needed. He has continued to make lifestyle changes and is down another 9 pounds since last visit. Encouraged to keep up with changes.

## 2022-05-06 NOTE — Assessment & Plan Note (Signed)
No recurrence but old bottle of meclizine. Refilled today for as needed use only. #30 no refills.

## 2022-05-06 NOTE — Patient Instructions (Signed)
We will check the labs today. 

## 2022-05-06 NOTE — Progress Notes (Signed)
   Subjective:   Patient ID: [name] [name], male    DOB: [birth year], 67 y.o.   MRN: [id number]  HPI The patient is a 67 YO man coming in for follow up.  Review of Systems  Constitutional: Negative.   HENT: Negative.    Eyes: Negative.   Respiratory:  Negative for cough, chest tightness and shortness of breath.   Cardiovascular:  Negative for chest pain, palpitations and leg swelling.  Gastrointestinal:  Negative for abdominal distention, abdominal pain, constipation, diarrhea, nausea and vomiting.  Musculoskeletal: Negative.   Skin: Negative.   Neurological: Negative.   Psychiatric/Behavioral: Negative.      Objective:  Physical Exam Constitutional:      Appearance: He is well-developed.  HENT:     Head: Normocephalic and atraumatic.  Cardiovascular:     Rate and Rhythm: Normal rate and regular rhythm.  Pulmonary:     Effort: Pulmonary effort is normal. No respiratory distress.     Breath sounds: Normal breath sounds. No wheezing or rales.  Abdominal:     General: Bowel sounds are normal. There is no distension.     Palpations: Abdomen is soft.     Tenderness: There is no abdominal tenderness. There is no rebound.  Musculoskeletal:     Cervical back: Normal range of motion.  Skin:    General: Skin is warm and dry.  Neurological:     Mental Status: He is alert and oriented to person, place, and time.     Coordination: Coordination normal.     Vitals:   05/06/22 0813  BP: 122/70  Pulse: (!) 54  Temp: 98 F (36.7 C)  TempSrc: Oral  SpO2: 97%  Weight: 177 lb (80.3 kg)  Height: 5\' 8"  (1.727 m)    Assessment & Plan:

## 2022-05-06 NOTE — Assessment & Plan Note (Signed)
Checking lipid panel and adjust zetia 10 mg daily and simvastatin 40 mg daily for goal LDL <70.

## 2022-05-13 ENCOUNTER — Encounter: Payer: Self-pay | Admitting: Gastroenterology

## 2022-06-19 ENCOUNTER — Other Ambulatory Visit: Payer: Self-pay | Admitting: Internal Medicine

## 2022-06-20 ENCOUNTER — Telehealth: Payer: Self-pay | Admitting: Internal Medicine

## 2022-06-20 ENCOUNTER — Other Ambulatory Visit: Payer: Self-pay

## 2022-06-20 MED ORDER — LOSARTAN POTASSIUM-HCTZ 100-25 MG PO TABS: 1.0000 | ORAL_TABLET | Freq: Every day | ORAL | 0 refills | Status: DC

## 2022-06-20 MED ORDER — METOPROLOL TARTRATE 25 MG PO TABS: 25.0000 mg | ORAL_TABLET | Freq: Two times a day (BID) | ORAL | 3 refills | Status: DC

## 2022-06-20 NOTE — Telephone Encounter (Signed)
Prescription Request  06/20/2022  LOV: 05/06/2022  What is the name of the medication or equipment? metoprolol tartrate (LOPRESSOR) 25 MG tablet  Have you contacted your pharmacy to request a refill? Yes   Which pharmacy would you like this sent to?  [facility] [id number] [place], [place] - [address] [place] [address] Phone: [phone] Fax: [phone]    Patient notified that their request is being sent to the clinical staff for review and that they should receive a response within 2 business days.   Please advise at Mobile [phone] (mobile)

## 2022-06-28 ENCOUNTER — Other Ambulatory Visit: Payer: Self-pay

## 2022-06-28 MED ORDER — EZETIMIBE 10 MG PO TABS: 10.0000 mg | ORAL_TABLET | Freq: Every day | ORAL | 0 refills | Status: DC

## 2022-07-13 DIAGNOSIS — H5213 Myopia, bilateral: Secondary | ICD-10-CM | POA: Diagnosis not present

## 2022-08-01 ENCOUNTER — Encounter: Payer: 59 | Admitting: Internal Medicine

## 2022-08-17 ENCOUNTER — Encounter: Payer: Self-pay | Admitting: Internal Medicine

## 2022-08-17 ENCOUNTER — Ambulatory Visit (INDEPENDENT_AMBULATORY_CARE_PROVIDER_SITE_OTHER): Payer: Medicare Other | Admitting: Internal Medicine

## 2022-08-17 VITALS — BP 120/74 | HR 95 | Temp 98.6°F | Ht 68.0 in | Wt 179.6 lb

## 2022-08-17 DIAGNOSIS — D126 Benign neoplasm of colon, unspecified: Secondary | ICD-10-CM

## 2022-08-17 DIAGNOSIS — Z0000 Encounter for general adult medical examination without abnormal findings: Secondary | ICD-10-CM

## 2022-08-17 DIAGNOSIS — R7303 Prediabetes: Secondary | ICD-10-CM | POA: Diagnosis not present

## 2022-08-17 DIAGNOSIS — E782 Mixed hyperlipidemia: Secondary | ICD-10-CM | POA: Diagnosis not present

## 2022-08-17 DIAGNOSIS — Z951 Presence of aortocoronary bypass graft: Secondary | ICD-10-CM

## 2022-08-17 DIAGNOSIS — I10 Essential (primary) hypertension: Secondary | ICD-10-CM

## 2022-08-17 LAB — MICROALBUMIN / CREATININE URINE RATIO
Creatinine,U: 164.3 mg/dL
Microalb Creat Ratio: 0.4 mg/g (ref 0.0–30.0)
Microalb, Ur: <0.7 mg/dL (ref 0.0–1.9)

## 2022-08-17 LAB — HEMOGLOBIN A1C: Hgb A1c MFr Bld: 6.6 % — ABNORMAL HIGH (ref 4.6–6.5)

## 2022-08-17 NOTE — Assessment & Plan Note (Signed)
Recent lipids at goal on zetia 10 mg daily and simvastatin 40 mg daily. Continue.

## 2022-08-17 NOTE — Assessment & Plan Note (Signed)
BP at goal on metoprolol 25 mg BID and losartan/hydrochlorothiazide 100/25 mg daily. Recent CMP at goal will continue same dosing.

## 2022-08-17 NOTE — Progress Notes (Signed)
Subjective:   Patient ID: [name] [name], male    DOB: Jan 24, 1955, 67 y.o.   MRN: [id number]  HPI Here for medicare wellness and physical, no new complaints. Please see A/P for status and treatment of chronic medical problems.   Diet: heart healthy Physical activity: active walks 3 times per week Depression/mood screen: negative Hearing: intact to whispered voice Visual acuity: grossly normal, performs annual eye exam  ADLs: capable Fall risk: none Home safety: good Cognitive evaluation: intact to orientation, naming, recall and repetition EOL planning: adv directives discussed  Flowsheet Row Office Visit from 08/17/2022 in [facility] [place]  PHQ-2 Total Score 0       Flowsheet Row Office Visit from 07/27/2021 in [facility]  PHQ-9 Total Score 0         07/15/2019    9:43 AM 09/14/2019   10:17 AM 07/27/2021    8:04 AM 05/06/2022    8:16 AM 08/17/2022    8:09 AM  Fall Risk  Falls in the past year?   0 0 0  Was there an injury with Fall?   0 0 0  Fall Risk Category Calculator   0 0 0  Fall Risk Category (Retired)   Low    (RETIRED) Patient Fall Risk Level Moderate fall risk Low fall risk     Patient at Risk for Falls Due to    No Fall Risks No Fall Risks  Fall risk Follow up    Falls evaluation completed Falls evaluation completed    I have personally reviewed and have noted 1. The patient's medical and social history - reviewed today no changes 2. Their use of alcohol, tobacco or illicit drugs 3. Their current medications and supplements 4. The patient's functional ability including ADL's, fall risks, home safety risks and hearing or visual impairment. 5. Diet and physical activities 6. Evidence for depression or mood disorders 7. Care team reviewed and updated 8.  The patient is not on an opioid pain medication  Patient Care Team: Myrlene Broker, MD as PCP - General (Internal Medicine) Wendall Stade, MD as PCP - Cardiology (Cardiology) Wendall Stade, MD as Consulting Physician (Cardiology) Delight Ovens, MD (Inactive) (Cardiothoracic Surgery) Past Medical History:  Diagnosis Date   Arthritis    Blood clot in vein    superficial / right leg following CABG   CAD (coronary artery disease)    Chest pain    Colon polyps    Complication of anesthesia    hard to wake up   Essential hypertension, benign    Headache    migraines as teenager   Heart murmur    History of blood transfusion    History of kidney stones    Hyperlipemia    Obesity    Tobacco abuse    Past Surgical History:  Procedure Laterality Date   CARDIAC CATHETERIZATION     CIRCUMCISION  1986   CORONARY ARTERY BYPASS GRAFT     Coronary artery bypass grafting x4  10/21/2010   CYSTOSCOPY WITH RETROGRADE PYELOGRAM, URETEROSCOPY AND STENT PLACEMENT Left 09/01/2014   Procedure: CYSTOSCOPY WITH RETROGRADE PYELOGRAM, URETEROSCOPY AND STENT PLACEMENT;  Surgeon: Bjorn Pippin, MD;  Location: [facility];  Service: Urology;  Laterality: Left;   CYSTOSCOPY WITH URETEROSCOPY AND STENT PLACEMENT Left 09/09/2014   Procedure: CYSTOSCOPY,LEFT URETEROSCOPY AND STENT PLACEMENT;  Surgeon: Bjorn Pippin, MD;  Location: [facility];  Service: Urology;  Laterality: Left;  HEMORRHOID SURGERY     HOLMIUM LASER APPLICATION Left 09/09/2014   Procedure: WITH HOLMIUM LASER ;  Surgeon: Bjorn Pippin, MD;  Location: [facility];  Service: Urology;  Laterality: Left;   Stapled hemorrhoidopexy.  10/19/2006   TOTAL HIP ARTHROPLASTY Left 05/29/2015   Procedure: LEFT TOTAL HIP ARTHROPLASTY ANTERIOR APPROACH;  Surgeon: Kathryne Hitch, MD;  Location: [facility];  Service: Orthopedics;  Laterality: Left;   VASECTOMY  1986   Family History  Problem Relation Age of Onset   Hypertension Father    Leukemia Mother    Hypertension Mother    Colon cancer Neg Hx    Review of Systems  Constitutional: Negative.   HENT: Negative.    Eyes: Negative.    Respiratory:  Negative for cough, chest tightness and shortness of breath.   Cardiovascular:  Negative for chest pain, palpitations and leg swelling.  Gastrointestinal:  Negative for abdominal distention, abdominal pain, constipation, diarrhea, nausea and vomiting.  Musculoskeletal: Negative.   Skin: Negative.   Neurological: Negative.   Psychiatric/Behavioral: Negative.      Objective:  Physical Exam Constitutional:      Appearance: He is well-developed.  HENT:     Head: Normocephalic and atraumatic.  Cardiovascular:     Rate and Rhythm: Normal rate and regular rhythm.  Pulmonary:     Effort: Pulmonary effort is normal. No respiratory distress.     Breath sounds: Normal breath sounds. No wheezing or rales.  Abdominal:     General: Bowel sounds are normal. There is no distension.     Palpations: Abdomen is soft.     Tenderness: There is no abdominal tenderness. There is no rebound.  Musculoskeletal:     Cervical back: Normal range of motion.  Skin:    General: Skin is warm and dry.  Neurological:     Mental Status: He is alert and oriented to person, place, and time.     Coordination: Coordination normal.     Vitals:   08/17/22 0804  BP: 120/74  Pulse: 95  Temp: 98.6 F (37 C)  TempSrc: Temporal  SpO2: 98%  Weight: 179 lb 9 oz (81.4 kg)  Height: 5\' 8"  (1.727 m)    Assessment & Plan:

## 2022-08-17 NOTE — Assessment & Plan Note (Signed)
No chest pains and taking beta blocker, ARB, aspirin daily. Continue same.

## 2022-08-17 NOTE — Patient Instructions (Signed)
Call the heart doctor for a visit and we will get the colonoscopy scheduled.

## 2022-08-17 NOTE — Assessment & Plan Note (Signed)
Flu shot yearly. Pneumonia complete. Shingrix due at pharmacy. Tetanus due 2029. Colonoscopy due referral to GI placed. Counseled about sun safety and mole surveillance. Counseled about the dangers of distracted driving. Given 10 year screening recommendations.

## 2022-08-17 NOTE — Assessment & Plan Note (Signed)
Most recent levels in diabetes range but only 1 reading. Checking HgA1c and microalbumin:creatinine and foot exam done. If persistently >6.5 will be new diabetes. On ARB and statin. Adjust as needed.

## 2022-08-17 NOTE — Assessment & Plan Note (Signed)
Due repeat colonoscopy this year referral to GI placed as he has not been outreached.

## 2022-08-18 ENCOUNTER — Other Ambulatory Visit: Payer: Self-pay | Admitting: Internal Medicine

## 2022-08-18 DIAGNOSIS — B356 Tinea cruris: Secondary | ICD-10-CM

## 2022-09-09 ENCOUNTER — Other Ambulatory Visit: Payer: Self-pay | Admitting: Internal Medicine

## 2022-09-09 DIAGNOSIS — B356 Tinea cruris: Secondary | ICD-10-CM

## 2022-09-15 ENCOUNTER — Other Ambulatory Visit: Payer: Self-pay | Admitting: Cardiovascular Disease

## 2022-09-16 ENCOUNTER — Telehealth: Payer: Self-pay | Admitting: Internal Medicine

## 2022-09-16 ENCOUNTER — Other Ambulatory Visit: Payer: Self-pay | Admitting: Internal Medicine

## 2022-09-16 DIAGNOSIS — B356 Tinea cruris: Secondary | ICD-10-CM

## 2022-09-16 MED ORDER — CLOTRIMAZOLE 1 % EX CREA: TOPICAL_CREAM | Freq: Two times a day (BID) | CUTANEOUS | 11 refills | Status: AC

## 2022-09-16 NOTE — Telephone Encounter (Signed)
Pt called wanting to switch his medication to Dr. Okey Dupre to prescribed for clotrimazole (LOTRIMIN) 1 % cream. Please advise

## 2022-09-16 NOTE — Telephone Encounter (Signed)
Sent in

## 2022-09-26 ENCOUNTER — Other Ambulatory Visit: Payer: Self-pay | Admitting: Cardiovascular Disease

## 2022-10-18 DIAGNOSIS — K080 Exfoliation of teeth due to systemic causes: Secondary | ICD-10-CM | POA: Diagnosis not present

## 2022-10-25 ENCOUNTER — Ambulatory Visit: Payer: Medicare Other | Admitting: Internal Medicine

## 2022-11-04 ENCOUNTER — Encounter: Payer: Self-pay | Admitting: Gastroenterology

## 2022-11-04 ENCOUNTER — Ambulatory Visit (AMBULATORY_SURGERY_CENTER): Payer: Medicare Other | Admitting: *Deleted

## 2022-11-04 VITALS — Ht 68.0 in | Wt 177.0 lb

## 2022-11-04 DIAGNOSIS — Z860101 Personal history of adenomatous and serrated colon polyps: Secondary | ICD-10-CM

## 2022-11-04 MED ORDER — NA SULFATE-K SULFATE-MG SULF 17.5-3.13-1.6 GM/177ML PO SOLN: 1.0000 | Freq: Once | ORAL | 0 refills | Status: AC

## 2022-11-04 NOTE — Progress Notes (Signed)
Pt's name and DOB verified at the beginning of the pre-visit wit 2 identifiers  Pt denies any difficulty with ambulating,sitting, laying down or rolling side to side  Gave both [facility] main # and MD on call # prior to instructions.   No egg or soy allergy known to patient   No issues known to pt with past sedation with any surgeries or procedures  Pt denies having issues being intubated  Pt has no issues moving head neck or swallowing  No FH of Malignant Hyperthermia  Pt is not on diet pills or shots  Pt is not on home 02   Pt is not on blood thinners   Pt denies issues with constipation   Pt is not on dialysis  Pt denise any abnormal heart rhythms   Pt denies any upcoming cardiac testing  Pt encouraged to use to use Singlecare or Goodrx to reduce cost   Patient's chart reviewed by Cathlyn Parsons CNRA prior to pre-visit and patient appropriate for the [facility].  Pre-visit completed and red dot placed by patient's name on their procedure day (on provider's schedule).  [employer] Coupon sent bia text to pt per his permission  Visit by phone  Pt states weight is 177 lb  Instructed pt why it is important to and  to call if they have any changes in health or new medications. Directed them to the # given and on instructions.     Instructions reviewed with pt and pt states understanding. Instructed to review again prior to procedure. Pt states they will.   Instructions sent by mail with coupon and by my chart

## 2022-12-07 ENCOUNTER — Ambulatory Visit: Payer: Medicare Other | Admitting: Physician Assistant

## 2022-12-13 ENCOUNTER — Ambulatory Visit: Payer: Medicare Other | Admitting: Internal Medicine

## 2022-12-14 ENCOUNTER — Other Ambulatory Visit: Payer: Self-pay | Admitting: Internal Medicine

## 2022-12-15 ENCOUNTER — Ambulatory Visit: Payer: Medicare Other | Admitting: Internal Medicine

## 2022-12-15 ENCOUNTER — Encounter: Payer: Self-pay | Admitting: Internal Medicine

## 2022-12-15 VITALS — BP 112/80 | HR 69 | Temp 98.4°F | Ht 68.0 in | Wt 184.0 lb

## 2022-12-15 DIAGNOSIS — E785 Hyperlipidemia, unspecified: Secondary | ICD-10-CM

## 2022-12-15 DIAGNOSIS — E1169 Type 2 diabetes mellitus with other specified complication: Secondary | ICD-10-CM

## 2022-12-15 DIAGNOSIS — E118 Type 2 diabetes mellitus with unspecified complications: Secondary | ICD-10-CM

## 2022-12-15 LAB — POCT GLYCOSYLATED HEMOGLOBIN (HGB A1C): HbA1c POC (<> result, manual entry): 6.4 % (ref 4.0–5.6)

## 2022-12-15 NOTE — Progress Notes (Signed)
   Subjective:   Patient ID: [name] [name], male    DOB: 05/21/1955, 67 y.o.   MRN: [id number]  HPI The patient is a 67 YO man coming in for diabetes follow up. Has made some small changes.   Review of Systems  Constitutional: Negative.   HENT: Negative.    Eyes: Negative.   Respiratory:  Negative for cough, chest tightness and shortness of breath.   Cardiovascular:  Negative for chest pain, palpitations and leg swelling.  Gastrointestinal:  Negative for abdominal distention, abdominal pain, constipation, diarrhea, nausea and vomiting.  Musculoskeletal: Negative.   Skin: Negative.   Neurological: Negative.   Psychiatric/Behavioral: Negative.      Objective:  Physical Exam Constitutional:      Appearance: He is well-developed.  HENT:     Head: Normocephalic and atraumatic.  Cardiovascular:     Rate and Rhythm: Normal rate and regular rhythm.  Pulmonary:     Effort: Pulmonary effort is normal. No respiratory distress.     Breath sounds: Normal breath sounds. No wheezing or rales.  Abdominal:     General: Bowel sounds are normal. There is no distension.     Palpations: Abdomen is soft.     Tenderness: There is no abdominal tenderness. There is no rebound.  Musculoskeletal:     Cervical back: Normal range of motion.  Skin:    General: Skin is warm and dry.  Neurological:     Mental Status: He is alert and oriented to person, place, and time.     Coordination: Coordination normal.     Vitals:   12/15/22 0832  BP: 112/80  Pulse: 69  Temp: 98.4 F (36.9 C)  TempSrc: Oral  SpO2: 98%  Weight: 179 lb (81.2 kg)  Height: 5\' 8"  (1.727 m)    Assessment & Plan:

## 2022-12-15 NOTE — Assessment & Plan Note (Signed)
Last lipid panel within the year and LDL <70. Will continue simvastatin and zetia at current doses.

## 2022-12-15 NOTE — Assessment & Plan Note (Signed)
Multiple readings in diabetes range. POC HgA1c done today and 6.2 given encouragement. Would need to see consistent readings out of diabetes range to change diagnosis. Discussed diet and exercise and eye exam. Taking statin and ARB.

## 2022-12-15 NOTE — Addendum Note (Signed)
Addended by: Levonne Lapping on: 12/15/2022 09:05 AM   Modules accepted: Orders

## 2022-12-23 ENCOUNTER — Telehealth: Payer: Self-pay | Admitting: Internal Medicine

## 2022-12-23 ENCOUNTER — Other Ambulatory Visit: Payer: Self-pay

## 2022-12-23 NOTE — Telephone Encounter (Signed)
Patient has one pill left.   Prescription Request  12/23/2022  LOV: 12/15/2022  What is the name of the medication or equipment?  losartan-hydrochlorothiazide (HYZAAR) 100-25 MG tablet  Have you contacted your pharmacy to request a refill? Yes   Which pharmacy would you like this sent to?  [id number] [place], [place] - [address] [place] [address] Phone: [phone] Fax: [phone]    Patient notified that their request is being sent to the clinical staff for review and that they should receive a response within 2 business days.   Please advise at Mobile [phone] (mobile)

## 2022-12-27 ENCOUNTER — Other Ambulatory Visit: Payer: Self-pay

## 2022-12-27 ENCOUNTER — Telehealth: Payer: Self-pay | Admitting: Internal Medicine

## 2022-12-27 ENCOUNTER — Encounter: Payer: Self-pay | Admitting: Gastroenterology

## 2022-12-27 ENCOUNTER — Other Ambulatory Visit: Payer: Self-pay | Admitting: Cardiovascular Disease

## 2022-12-27 ENCOUNTER — Ambulatory Visit: Payer: Medicare Other | Admitting: Gastroenterology

## 2022-12-27 VITALS — BP 106/63 | HR 46 | Temp 97.3°F | Resp 12 | Ht 68.0 in | Wt 177.0 lb

## 2022-12-27 DIAGNOSIS — K6289 Other specified diseases of anus and rectum: Secondary | ICD-10-CM

## 2022-12-27 DIAGNOSIS — K5730 Diverticulosis of large intestine without perforation or abscess without bleeding: Secondary | ICD-10-CM | POA: Diagnosis not present

## 2022-12-27 DIAGNOSIS — K641 Second degree hemorrhoids: Secondary | ICD-10-CM

## 2022-12-27 DIAGNOSIS — D123 Benign neoplasm of transverse colon: Secondary | ICD-10-CM | POA: Diagnosis not present

## 2022-12-27 DIAGNOSIS — Z1211 Encounter for screening for malignant neoplasm of colon: Secondary | ICD-10-CM | POA: Diagnosis not present

## 2022-12-27 DIAGNOSIS — D122 Benign neoplasm of ascending colon: Secondary | ICD-10-CM

## 2022-12-27 DIAGNOSIS — D120 Benign neoplasm of cecum: Secondary | ICD-10-CM

## 2022-12-27 DIAGNOSIS — Z860101 Personal history of adenomatous and serrated colon polyps: Secondary | ICD-10-CM | POA: Diagnosis not present

## 2022-12-27 DIAGNOSIS — D124 Benign neoplasm of descending colon: Secondary | ICD-10-CM | POA: Diagnosis not present

## 2022-12-27 DIAGNOSIS — K644 Residual hemorrhoidal skin tags: Secondary | ICD-10-CM | POA: Diagnosis not present

## 2022-12-27 DIAGNOSIS — D125 Benign neoplasm of sigmoid colon: Secondary | ICD-10-CM

## 2022-12-27 DIAGNOSIS — D128 Benign neoplasm of rectum: Secondary | ICD-10-CM

## 2022-12-27 MED ORDER — SODIUM CHLORIDE 0.9 % IV SOLN: 500.0000 mL | Freq: Once | INTRAVENOUS | Status: DC

## 2022-12-27 MED ORDER — LOSARTAN POTASSIUM-HCTZ 100-25 MG PO TABS: 1.0000 | ORAL_TABLET | Freq: Every day | ORAL | 0 refills | Status: DC

## 2022-12-27 NOTE — Progress Notes (Signed)
GASTROENTEROLOGY PROCEDURE H&P NOTE   Primary Care Physician: Myrlene Broker, MD  HPI: [name] [name] is a 67 y.o. male who presents for Colonoscopy for surveillance of prior adenomas.  Past Medical History:  Diagnosis Date   Arthritis    Blood clot in vein    superficial / right leg following CABG   CAD (coronary artery disease)    Chest pain    Colon polyps    Complication of anesthesia    hard to wake up   Essential hypertension, benign    Headache    migraines as teenager   Heart murmur    History of blood transfusion    Hyperlipemia    Obesity    Tobacco abuse    Past Surgical History:  Procedure Laterality Date   CARDIAC CATHETERIZATION     CIRCUMCISION  1986   CORONARY ARTERY BYPASS GRAFT     Coronary artery bypass grafting x4  10/21/2010   CYSTOSCOPY WITH RETROGRADE PYELOGRAM, URETEROSCOPY AND STENT PLACEMENT Left 09/01/2014   Procedure: CYSTOSCOPY WITH RETROGRADE PYELOGRAM, URETEROSCOPY AND STENT PLACEMENT;  Surgeon: Bjorn Pippin, MD;  Location: [facility];  Service: Urology;  Laterality: Left;   CYSTOSCOPY WITH URETEROSCOPY AND STENT PLACEMENT Left 09/09/2014   Procedure: CYSTOSCOPY,LEFT URETEROSCOPY AND STENT PLACEMENT;  Surgeon: Bjorn Pippin, MD;  Location: [facility];  Service: Urology;  Laterality: Left;   HEMORRHOID SURGERY     HOLMIUM LASER APPLICATION Left 09/09/2014   Procedure: WITH HOLMIUM LASER ;  Surgeon: Bjorn Pippin, MD;  Location: [facility];  Service: Urology;  Laterality: Left;   Stapled hemorrhoidopexy.  10/19/2006   TOTAL HIP ARTHROPLASTY Left 05/29/2015   Procedure: LEFT TOTAL HIP ARTHROPLASTY ANTERIOR APPROACH;  Surgeon: Kathryne Hitch, MD;  Location: [facility];  Service: Orthopedics;  Laterality: Left;   VASECTOMY  1986   Current Outpatient Medications  Medication Sig Dispense Refill   acetaminophen (TYLENOL) 650 MG CR tablet Take 650 mg by mouth every 8 (eight) hours as needed for pain.     aspirin EC 81 MG tablet Take 1 tablet (81 mg  total) by mouth daily.     Blood Glucose Monitoring Suppl (ONE TOUCH ULTRA 2) w/Device KIT Check blood sugar once daily. 1 kit 0   Camphor-Menthol-Methyl Sal (SALONPAS) 3.01-16-08 % PTCH Apply 1 patch topically daily as needed (back pain).     clotrimazole (LOTRIMIN) 1 % cream Apply topically 2 (two) times daily. 30 g 11   ezetimibe (ZETIA) 10 MG tablet TAKE 1 TABLET BY MOUTH DAILY 90 tablet 0   losartan-hydrochlorothiazide (HYZAAR) 100-25 MG tablet Take 1 tablet by mouth daily. 90 tablet 0   meclizine (ANTIVERT) 25 MG tablet Take 1 tablet (25 mg total) by mouth 3 (three) times daily as needed for dizziness. 30 tablet 0   Menthol, Topical Analgesic, (BENGAY EX) Apply 1 application topically daily as needed (back pain).     metoprolol tartrate (LOPRESSOR) 25 MG tablet Take 1 tablet (25 mg total) by mouth 2 (two) times daily. 180 tablet 3   nitroGLYCERIN (NITROSTAT) 0.4 MG SL tablet Place 1 tablet (0.4 mg total) under the tongue every 5 (five) minutes as needed for chest pain. 25 tablet 3   simvastatin (ZOCOR) 40 MG tablet TAKE 1 TABLET (40 MG TOTAL) BY MOUTH DAILY AT 6 PM. 60 tablet 12   No current facility-administered medications for this visit.    Current Outpatient Medications:    acetaminophen (TYLENOL) 650 MG CR tablet, Take 650 mg by mouth  every 8 (eight) hours as needed for pain., Disp: , Rfl:    aspirin EC 81 MG tablet, Take 1 tablet (81 mg total) by mouth daily., Disp: , Rfl:    Blood Glucose Monitoring Suppl (ONE TOUCH ULTRA 2) w/Device KIT, Check blood sugar once daily., Disp: 1 kit, Rfl: 0   Camphor-Menthol-Methyl Sal (SALONPAS) 3.01-16-08 % PTCH, Apply 1 patch topically daily as needed (back pain)., Disp: , Rfl:    clotrimazole (LOTRIMIN) 1 % cream, Apply topically 2 (two) times daily., Disp: 30 g, Rfl: 11   ezetimibe (ZETIA) 10 MG tablet, TAKE 1 TABLET BY MOUTH DAILY, Disp: 90 tablet, Rfl: 0   losartan-hydrochlorothiazide (HYZAAR) 100-25 MG tablet, Take 1 tablet by mouth daily., Disp:  90 tablet, Rfl: 0   meclizine (ANTIVERT) 25 MG tablet, Take 1 tablet (25 mg total) by mouth 3 (three) times daily as needed for dizziness., Disp: 30 tablet, Rfl: 0   Menthol, Topical Analgesic, (BENGAY EX), Apply 1 application topically daily as needed (back pain)., Disp: , Rfl:    metoprolol tartrate (LOPRESSOR) 25 MG tablet, Take 1 tablet (25 mg total) by mouth 2 (two) times daily., Disp: 180 tablet, Rfl: 3   nitroGLYCERIN (NITROSTAT) 0.4 MG SL tablet, Place 1 tablet (0.4 mg total) under the tongue every 5 (five) minutes as needed for chest pain., Disp: 25 tablet, Rfl: 3   simvastatin (ZOCOR) 40 MG tablet, TAKE 1 TABLET (40 MG TOTAL) BY MOUTH DAILY AT 6 PM., Disp: 60 tablet, Rfl: 12 Allergies  Allergen Reactions   Codeine Other (See Comments)    migraines   Family History  Problem Relation Age of Onset   Leukemia Mother    Hypertension Mother    Hypertension Father    Colon cancer Neg Hx    Colon polyps Neg Hx    Esophageal cancer Neg Hx    Rectal cancer Neg Hx    Stomach cancer Neg Hx    Social History   Socioeconomic History   Marital status: Married    Spouse name: Not on file   Number of children: Not on file   Years of education: Not on file   Highest education level: Associate degree: occupational, Scientist, product/process development, or vocational program  Occupational History   Occupation: [employer]  Tobacco Use   Smoking status: Former    Current packs/day: 0.00    Average packs/day: 0.5 packs/day for 7.0 years (3.5 ttl pk-yrs)    Types: Cigarettes    Start date: 01/10/1974    Quit date: 01/10/1981    Years since quitting: 41.9   Smokeless tobacco: Never  Vaping Use   Vaping status: Never Used  Substance and Sexual Activity   Alcohol use: No    Alcohol/week: 0.0 standard drinks of alcohol   Drug use: No   Sexual activity: Not on file  Other Topics Concern   Not on file  Social History Narrative   Semi aerobic workouts   Social Drivers of Health   Financial Resource Strain: Low  Risk  (10/23/2022)   Overall Financial Resource Strain (CARDIA)    Difficulty of Paying Living Expenses: Not hard at all  Food Insecurity: No Food Insecurity (10/23/2022)   Hunger Vital Sign    Worried About Running Out of Food in the Last Year: Never true    Ran Out of Food in the Last Year: Never true  Transportation Needs: No Transportation Needs (10/23/2022)   PRAPARE - Administrator, Civil Service (Medical): No  Lack of Transportation (Non-Medical): No  Physical Activity: Unknown (10/23/2022)   Exercise Vital Sign    Days of Exercise per Week: 2 days    Minutes of Exercise per Session: Patient declined  Stress: No Stress Concern Present (10/23/2022)   [employer] of Occupational Health - Occupational Stress Questionnaire    Feeling of Stress : Not at all  Social Connections: Moderately Integrated (10/23/2022)   Social Connection and Isolation Panel [NHANES]    Frequency of Communication with Friends and Family: Three times a week    Frequency of Social Gatherings with Friends and Family: Patient declined    Attends Religious Services: More than 4 times per year    Active Member of [employer] or Organizations: No    Attends Engineer, structural: Not on file    Marital Status: Married  Catering manager Violence: Not on file    Physical Exam: There were no vitals filed for this visit. There is no height or weight on file to calculate BMI. GEN: NAD EYE: Sclerae anicteric ENT: MMM CV: Non-tachycardic GI: Soft, NT/ND NEURO:  Alert & Oriented x 3  Lab Results: No results for input(s): "WBC", "HGB", "HCT", "PLT" in the last 72 hours. BMET No results for input(s): "NA", "K", "CL", "CO2", "GLUCOSE", "BUN", "CREATININE", "CALCIUM" in the last 72 hours. LFT No results for input(s): "PROT", "ALBUMIN", "AST", "ALT", "ALKPHOS", "BILITOT", "BILIDIR", "IBILI" in the last 72 hours. PT/INR No results for input(s): "LABPROT", "INR" in the last 72  hours.   Impression / Plan: This is a 67 y.o.male who presents for Colonoscopy for surveillance of prior adenomas.  The risks and benefits of endoscopic evaluation/treatment were discussed with the patient and/or family; these include but are not limited to the risk of perforation, infection, bleeding, missed lesions, lack of diagnosis, severe illness requiring hospitalization, as well as anesthesia and sedation related illnesses.  The patient's history has been reviewed, patient examined, no change in status, and deemed stable for procedure.  The patient and/or family is agreeable to proceed.    Corliss Parish, MD [facility] Office # [phone]

## 2022-12-27 NOTE — Telephone Encounter (Signed)
error 

## 2022-12-27 NOTE — Patient Instructions (Signed)
Await pathology results.  Continue present medications. Resume previous diet. High fiber diet.  Use FiberCon 1-2 tablets daily.  Handouts on polyps, diverticulosis, and hemorrhoids provided.  YOU HAD AN ENDOSCOPIC PROCEDURE TODAY AT THE [facility]:   Refer to the procedure report that was given to you for any specific questions about what was found during the examination.  If the procedure report does not answer your questions, please call your gastroenterologist to clarify.  If you requested that your care partner not be given the details of your procedure findings, then the procedure report has been included in a sealed envelope for you to review at your convenience later.  YOU SHOULD EXPECT: Some feelings of bloating in the abdomen. Passage of more gas than usual.  Walking can help get rid of the air that was put into your GI tract during the procedure and reduce the bloating. If you had a lower endoscopy (such as a colonoscopy or flexible sigmoidoscopy) you may notice spotting of blood in your stool or on the toilet paper. If you underwent a bowel prep for your procedure, you may not have a normal bowel movement for a few days.  Please Note:  You might notice some irritation and congestion in your nose or some drainage.  This is from the oxygen used during your procedure.  There is no need for concern and it should clear up in a day or so.  SYMPTOMS TO REPORT IMMEDIATELY:  Following lower endoscopy (colonoscopy or flexible sigmoidoscopy):  Excessive amounts of blood in the stool  Significant tenderness or worsening of abdominal pains  Swelling of the abdomen that is new, acute  Fever of 100F or higher   For urgent or emergent issues, a gastroenterologist can be reached at any hour by calling [phone]. Do not use MyChart messaging for urgent concerns.    DIET:  We do recommend a small meal at first, but then you may proceed to your regular diet.  Drink plenty of  fluids but you should avoid alcoholic beverages for 24 hours.  ACTIVITY:  You should plan to take it easy for the rest of today and you should NOT DRIVE or use heavy machinery until tomorrow (because of the sedation medicines used during the test).    FOLLOW UP: Our staff will call the number listed on your records the next business day following your procedure.  We will call around 7:15- 8:00 am to check on you and address any questions or concerns that you may have regarding the information given to you following your procedure. If we do not reach you, we will leave a message.     If any biopsies were taken you will be contacted by phone or by letter within the next 1-3 weeks.  Please call us at [phone] if you have not heard about the biopsies in 3 weeks.    SIGNATURES/CONFIDENTIALITY: You and/or your care partner have signed paperwork which will be entered into your electronic medical record.  These signatures attest to the fact that that the information above on your After Visit Summary has been reviewed and is understood.  Full responsibility of the confidentiality of this discharge information lies with you and/or your care-partner.

## 2022-12-27 NOTE — Op Note (Signed)
[facility] Patient Name: [name] [name] Procedure Date: 12/27/2022 9:04 AM MRN: [id number] Endoscopist: Corliss Parish , MD, [id number] Age: 67 Referring MD:  Date of Birth: Dec 01, 1955 Gender: Male Account #: [id number] Procedure:                Colonoscopy Indications:              Surveillance: Personal history of adenomatous                            polyps on last colonoscopy > 5 years ago Medicines:                Monitored Anesthesia Care Procedure:                Pre-Anesthesia Assessment:                           - Prior to the procedure, a History and Physical                            was performed, and patient medications and                            allergies were reviewed. The patient's tolerance of                            previous anesthesia was also reviewed. The risks                            and benefits of the procedure and the sedation                            options and risks were discussed with the patient.                            All questions were answered, and informed consent                            was obtained. Prior Anticoagulants: The patient has                            taken no anticoagulant or antiplatelet agents                            except for aspirin. ASA Grade Assessment: III - A                            patient with severe systemic disease. After                            reviewing the risks and benefits, the patient was                            deemed in satisfactory condition to undergo the  procedure.                           After obtaining informed consent, the colonoscope                            was passed under direct vision. Throughout the                            procedure, the patient's blood pressure, pulse, and                            oxygen saturations were monitored continuously. The                            Olympus Scope SN: [id number] was introduced  through                            the anus and advanced to the 5 cm into the ileum.                            The colonoscopy was performed without difficulty.                            The patient tolerated the procedure. The quality of                            the bowel preparation was adequate. The terminal                            ileum, ileocecal valve, appendiceal orifice, and                            rectum were photographed. Scope In: 9:15:16 AM Scope Out: 9:31:17 AM Scope Withdrawal Time: 0 hours 10 minutes 38 seconds  Total Procedure Duration: 0 hours 16 minutes 1 second  Findings:                 Skin tags were found on perianal exam.                           The digital rectal exam findings include                            hemorrhoids. Pertinent negatives include no                            palpable rectal lesions.                           The terminal ileum and ileocecal valve appeared                            normal.  Five sessile polyps were found in the rectum,                            descending colon, hepatic flexure, ascending colon                            and cecum. The polyps were 2 to 8 mm in size. These                            polyps were removed with a cold snare. Resection                            and retrieval were complete.                           A single small-mouthed diverticulum was found in                            the ascending colon.                           Multiple small-mouthed diverticula were found in                            the recto-sigmoid colon, sigmoid colon and                            descending colon.                           Anal papilla was hypertrophied.                           Non-bleeding non-thrombosed external and internal                            hemorrhoids were found during retroflexion, during                            perianal exam and during digital exam. The                             hemorrhoids were Grade II (internal hemorrhoids                            that prolapse but reduce spontaneously). Complications:            No immediate complications. Estimated Blood Loss:     Estimated blood loss was minimal. Impression:               - Perianal skin tags found on perianal exam.                            Hemorrhoids found on digital rectal exam.                           -  The examined portion of the ileum was normal.                           - Five, 2 to 8 mm polyps in the rectum, in the                            descending colon, at the hepatic flexure, in the                            ascending colon and in the cecum, removed with a                            cold snare. Resected and retrieved.                           -Single diverticulum in the ascending colon.                           - Diverticulosis in the recto-sigmoid colon, in the                            sigmoid colon and in the descending colon.                           - Anal papilla was hypertrophied.                           - Non-bleeding non-thrombosed external and internal                            hemorrhoids. Recommendation:           - The patient will be observed post-procedure,                            until all discharge criteria are met.                           - Discharge patient to home.                           - Patient has a contact number available for                            emergencies. The signs and symptoms of potential                            delayed complications were discussed with the                            patient. Return to normal activities tomorrow.                            Written discharge instructions were provided to the  patient.                           - High fiber diet.                           - Use FiberCon 1-2 tablets PO daily.                           - Continue present  medications.                           - Await pathology results.                           - Repeat colonoscopy in 3/5/7 years for                            surveillance based on pathology results.                           - The findings and recommendations were discussed                            with the patient.                           - The findings and recommendations were discussed                            with the patient's family. Corliss Parish, MD 12/27/2022 9:37:10 AM

## 2022-12-27 NOTE — Progress Notes (Signed)
Called to room to assist during endoscopic procedure.  Patient ID and intended procedure confirmed with present staff. Received instructions for my participation in the procedure from the performing physician.  

## 2022-12-27 NOTE — Progress Notes (Signed)
A/O x 3, gd SR's, VSS, report to RN

## 2022-12-27 NOTE — Telephone Encounter (Signed)
Pt called back checking on the status of his losartan please advise.

## 2022-12-27 NOTE — Progress Notes (Signed)
Pt's states no medical or surgical changes since previsit or office visit. 

## 2022-12-27 NOTE — Telephone Encounter (Signed)
Any updates on this refill request?  Please advise, Thanks Prescription Request  12/27/2022  LOV: Visit date not found  What is the name of the medication or equipment? losartan-hydrochlorothiazide (HYZAAR) 100-25 MG tablet   Have you contacted your pharmacy to request a refill? Yes   Which pharmacy would you like this sent to?  [id number] [place], [place] - [address] [place] [address] Phone: [phone] Fax: [phone]  Patient notified that their request is being sent to the clinical staff for review and that they should receive a response within 2 business days.   Please advise at Mobile [phone] (mobile)

## 2022-12-28 ENCOUNTER — Telehealth: Payer: Self-pay | Admitting: *Deleted

## 2022-12-28 NOTE — Telephone Encounter (Signed)
  Follow up Call-     12/27/2022    8:14 AM  Call back number  Post procedure Call Back phone  # [phone]  Permission to leave phone message Yes     Patient questions:  Do you have a fever, pain , or abdominal swelling? No. Pain Score  0 *  Have you tolerated food without any problems? Yes.    Have you been able to return to your normal activities? Yes.    Do you have any questions about your discharge instructions: Diet   No. Medications  No. Follow up visit  No.  Do you have questions or concerns about your Care? No.  Actions: * If pain score is 4 or above: No action needed, pain <4.

## 2022-12-29 LAB — SURGICAL PATHOLOGY

## 2023-01-01 ENCOUNTER — Encounter: Payer: Self-pay | Admitting: Gastroenterology

## 2023-02-09 DIAGNOSIS — H5213 Myopia, bilateral: Secondary | ICD-10-CM | POA: Diagnosis not present

## 2023-02-14 ENCOUNTER — Encounter: Payer: Self-pay | Admitting: Physician Assistant

## 2023-02-14 DIAGNOSIS — I6529 Occlusion and stenosis of unspecified carotid artery: Secondary | ICD-10-CM

## 2023-02-14 HISTORY — DX: Occlusion and stenosis of unspecified carotid artery: I65.29

## 2023-02-14 NOTE — Progress Notes (Signed)
 Cardiology Office Note:    Date:  02/15/2023  ID:  JACQUAN SAVAS, DOB 03/12/1955, MRN [id number] PCP: Rollene Almarie LABOR, MD  [facility] Providers Cardiologist:  Maude Emmer, MD       Patient Profile:      Coronary artery disease  S/p CABG in 2012 Myoview  08/31/21: no ischemia or infarction, EF 55; low risk  Hypertension  Hyperlipidemia   Pre-diabetes  Anemia  Carotid artery stenosis  US  08/31/21: Bilat ICA 1-39          TROI FLORENDO is a 68 y.o. male who returns for follow up of CAD. He was last seen in clinic by Olivia Pavy, PA-C in 08/2021.   Discussed the use of AI scribe software for clinical note transcription with the patient, who gave verbal consent to proceed.  History of Present Illness   He experienced some discomfort last week, which he describes as possibly related to gas. The discomfort was inconsistent, lasting about 10 to 15 minutes off and on, and has not recurred. No associated breathing problems or prolonged symptoms. Since his heart surgery 20 years ago, he is more attentive to unusual sensations. He experiences occasional lightheadedness, described as a feeling of being off balance rather than spinning. This has been present since 2021, and he uses meclizine  sparingly due to its side effects, having not completed a full bottle since it was prescribed. He carries nitroglycerin  tablets but has not needed to use them since his heart surgery 12 years ago, ensuring they are replaced annually if unopened. No chest pain or shortness of breath during physical activities, such as walking or climbing stairs. 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 He quit smoking 41 years ago when his daughter was born.      Review of Systems  Gastrointestinal:  Negative for hematochezia and melena.  Genitourinary:  Negative for hematuria.  -See HPI    Studies Reviewed:    EKG Interpretation Date/Time:  Wednesday February 15 2023 08:18:18 EST Ventricular Rate:  48 PR Interval:  156 QRS Duration:  84 QT Interval:  444 QTC Calculation: 396 R Axis:   47  Text Interpretation: Sinus bradycardia Nonspecific T wave abnormality No significant change since last tracing Confirmed by Lelon Hamilton [phone]) on 02/15/2023 8:21:31 AM    Results   LABS - Chart Review LDL: 54 (April 2024)       Risk Assessment/Calculations:             Physical Exam:   VS:  BP 110/68   Pulse (!) 49   Ht 5' 8 (1.727 m)   Wt 179 lb (81.2 kg)   SpO2 96%   BMI 27.22 kg/m    Wt Readings from Last 3 Encounters:  02/15/23 179 lb (81.2 kg)  12/27/22 177 lb (80.3 kg)  12/15/22 184 lb (83.5 kg)    Constitutional:      Appearance: Healthy appearance. Not in distress.  Neck:     Vascular: JVD normal.  Pulmonary:     Breath sounds: Normal breath sounds. No wheezing. No rales.  Cardiovascular:     Normal rate. Regular rhythm.     Murmurs: There is a grade 1/6 systolic murmur at the ULSB.  Edema:    Peripheral edema absent.  Abdominal:     Palpations: Abdomen is soft.      Assessment and Plan:   Assessment & Plan  Coronary artery disease involving native coronary artery of native heart without angina pectoris Status post CABG in 2012.  Myoview  in August 2023 low risk and negative for ischemia.  He is doing well without chest discomfort to suggest angina.  Continue aspirin  81 mg daily, simvastatin  40 mg daily.  Follow-up 1 year. Essential hypertension The patient's blood pressure is controlled on his current regimen.  Continue current therapy.  Hyperlipidemia associated with type 2 diabetes mellitus (HCC) LDL in April 2024 was optimal at 54.  Continue simvastatin  40 mg daily, Zetia  10 mg daily.  He notes he has annual physical with his PCP in April of this year. Bilateral carotid artery stenosis US  08/2021 with bilateral ICA stenosis 1-39%.  Continue ASA 81 mg daily, simvastatin   40 mg daily. Murmur He has a soft systolic murmur on exam.  He has not had an echocardiogram since 2012.  Obtain follow-up 2D echocardiogram to further evaluate. Bradycardia, sinus HR in the 40s. He has lightheadedness sometimes.  -DC Lopressor  -Start Metoprolol  succinate 25 mg once daily - consider DC if he continues to have issues with lightheadedness.      Dispo:  Return in about 1 year (around 02/15/2024) for Routine Follow Up, w/ Dr. Delford.  Signed, Glendia Ferrier, PA-C

## 2023-02-15 ENCOUNTER — Encounter: Payer: Self-pay | Admitting: Physician Assistant

## 2023-02-15 ENCOUNTER — Ambulatory Visit: Payer: Medicare Other | Attending: Physician Assistant | Admitting: Physician Assistant

## 2023-02-15 VITALS — BP 110/68 | HR 49 | Ht 68.0 in | Wt 179.0 lb

## 2023-02-15 DIAGNOSIS — I2510 Atherosclerotic heart disease of native coronary artery without angina pectoris: Secondary | ICD-10-CM | POA: Diagnosis not present

## 2023-02-15 DIAGNOSIS — E1169 Type 2 diabetes mellitus with other specified complication: Secondary | ICD-10-CM

## 2023-02-15 DIAGNOSIS — R001 Bradycardia, unspecified: Secondary | ICD-10-CM

## 2023-02-15 DIAGNOSIS — I10 Essential (primary) hypertension: Secondary | ICD-10-CM

## 2023-02-15 DIAGNOSIS — I6523 Occlusion and stenosis of bilateral carotid arteries: Secondary | ICD-10-CM

## 2023-02-15 DIAGNOSIS — E785 Hyperlipidemia, unspecified: Secondary | ICD-10-CM

## 2023-02-15 DIAGNOSIS — R011 Cardiac murmur, unspecified: Secondary | ICD-10-CM

## 2023-02-15 MED ORDER — NITROGLYCERIN 0.4 MG SL SUBL: 0.4000 mg | SUBLINGUAL_TABLET | SUBLINGUAL | 3 refills | Status: DC | PRN

## 2023-02-15 MED ORDER — METOPROLOL SUCCINATE ER 25 MG PO TB24: 25.0000 mg | ORAL_TABLET | Freq: Every day | ORAL | 3 refills | Status: DC

## 2023-02-15 NOTE — Assessment & Plan Note (Signed)
 LDL in April 2024 was optimal at 54.  Continue simvastatin  40 mg daily, Zetia  10 mg daily.  He notes he has annual physical with his PCP in April of this year.

## 2023-02-15 NOTE — Patient Instructions (Addendum)
 Medication Instructions:  Your physician has recommended you make the following change in your medication:  STOP Lopressor   START Toprol  XL 25 mg taking 1 daily   *If you need a refill on your cardiac medications before your next appointment, please call your pharmacy*   Lab Work: None ordered  If you have labs (blood work) drawn today and your tests are completely normal, you will receive your results only by: MyChart Message (if you have MyChart) OR A paper copy in the mail If you have any lab test that is abnormal or we need to change your treatment, we will call you to review the results.   Testing/Procedures: Your physician has requested that you have an echocardiogram. Echocardiography is a painless test that uses sound waves to create images of your heart. It provides your doctor with information about the size and shape of your heart and how well your heart's chambers and valves are working. This procedure takes approximately one hour. There are no restrictions for this procedure. Please do NOT wear cologne, perfume, aftershave, or lotions (deodorant is allowed). Please arrive 15 minutes prior to your appointment time.  Please note: We ask at that you not bring children with you during ultrasound (echo/ vascular) testing. Due to room size and safety concerns, children are not allowed in the ultrasound rooms during exams. Our front office staff cannot provide observation of children in our lobby area while testing is being conducted. An adult accompanying a patient to their appointment will only be allowed in the ultrasound room at the discretion of the ultrasound technician under special circumstances. We apologize for any inconvenience.   Follow-Up: At [facility], you and your health needs are our priority.  As part of our continuing mission to provide you with exceptional heart care, we have created designated Provider Care Teams.  These Care Teams include your primary  Cardiologist (physician) and Advanced Practice Providers (APPs -  Physician Assistants and Nurse Practitioners) who all work together to provide you with the care you need, when you need it.  We recommend signing up for the patient portal called MyChart.  Sign up information is provided on this After Visit Summary.  MyChart is used to connect with patients for Virtual Visits (Telemedicine).  Patients are able to view lab/test results, encounter notes, upcoming appointments, etc.  Non-urgent messages can be sent to your provider as well.   To learn more about what you can do with MyChart, go to [email or web address].    Your next appointment:   1 year(s)  Provider:   Maude Emmer, MD  or Glendia Ferrier, PA-C         Other Instructions       1st Floor: - Lobby - Registration  - Pharmacy  - Lab - Cafe  2nd Floor: - PV Lab - Diagnostic Testing (echo, CT, nuclear med)  3rd Floor: - Vacant  4th Floor: - TCTS (cardiothoracic surgery) - AFib Clinic - Structural Heart Clinic - Vascular Surgery  - Vascular Ultrasound  5th Floor: - [facility] (general and EP) - Clinical Pharmacy for coumadin, hypertension, lipid, weight-loss medications, and med management appointments    Valet parking services will be available as well.

## 2023-02-15 NOTE — Assessment & Plan Note (Signed)
 Status post CABG in 2012.  Myoview  in August 2023 low risk and negative for ischemia.  He is doing well without chest discomfort to suggest angina.  Continue aspirin  81 mg daily, simvastatin  40 mg daily.  Follow-up 1 year.

## 2023-02-15 NOTE — Assessment & Plan Note (Signed)
 US  08/2021 with bilateral ICA stenosis 1-39%.  Continue ASA 81 mg daily, simvastatin  40 mg daily.

## 2023-02-15 NOTE — Assessment & Plan Note (Signed)
The patient's blood pressure is controlled on his current regimen.  Continue current therapy.  

## 2023-03-07 ENCOUNTER — Other Ambulatory Visit (HOSPITAL_COMMUNITY): Payer: Medicare Other

## 2023-03-08 ENCOUNTER — Ambulatory Visit (HOSPITAL_COMMUNITY): Payer: Medicare Other | Attending: Cardiology

## 2023-03-08 ENCOUNTER — Encounter: Payer: Self-pay | Admitting: Physician Assistant

## 2023-03-08 DIAGNOSIS — I358 Other nonrheumatic aortic valve disorders: Secondary | ICD-10-CM | POA: Insufficient documentation

## 2023-03-08 DIAGNOSIS — R011 Cardiac murmur, unspecified: Secondary | ICD-10-CM | POA: Insufficient documentation

## 2023-03-08 HISTORY — DX: Other nonrheumatic aortic valve disorders: I35.8

## 2023-03-08 LAB — ECHOCARDIOGRAM COMPLETE
AR max vel: 2.53 cm2
AV Area VTI: 2.22 cm2
AV Area mean vel: 2.06 cm2
AV Mean grad: 5.9 mmHg
AV Peak grad: 10.8 mmHg
Ao pk vel: 1.64 m/s
Area-P 1/2: 3.79 cm2
S' Lateral: 3 cm

## 2023-03-09 ENCOUNTER — Other Ambulatory Visit: Payer: Self-pay | Admitting: *Deleted

## 2023-03-09 ENCOUNTER — Telehealth: Payer: Self-pay | Admitting: *Deleted

## 2023-03-09 DIAGNOSIS — I10 Essential (primary) hypertension: Secondary | ICD-10-CM

## 2023-03-09 DIAGNOSIS — D649 Anemia, unspecified: Secondary | ICD-10-CM

## 2023-03-09 DIAGNOSIS — I2510 Atherosclerotic heart disease of native coronary artery without angina pectoris: Secondary | ICD-10-CM

## 2023-03-09 DIAGNOSIS — E1169 Type 2 diabetes mellitus with other specified complication: Secondary | ICD-10-CM

## 2023-03-09 NOTE — Telephone Encounter (Signed)
-----   Message from Tereso Newcomer sent at 03/08/2023  5:31 PM EST ----- Results sent to [name] [name] via MyChart. See MyChart comments below. PLAN:  -Arrange BMET, CBC, TSH (Dx CAD, HTN, HL, anemia)  [name] [name]  Your echocardiogram demonstrates normal ejection fraction (heart function).  There is some thickening of the aortic valve called aortic valve sclerosis.  However, the valve is opening and closing normally.  This accounts for the murmur I heard on exam.  Some of the flow velocities of blood through your heart on the echocardiogram are similar to findings seen in patients with thyroid disease or significant anemia.  Therefore I am going to set you up for some lab work to check your kidney function, electrolytes, blood count and thyroid.  Otherwise continue current medications and follow-up as planned. Tereso Newcomer, PA-C

## 2023-03-10 DIAGNOSIS — E785 Hyperlipidemia, unspecified: Secondary | ICD-10-CM | POA: Diagnosis not present

## 2023-03-10 DIAGNOSIS — I2510 Atherosclerotic heart disease of native coronary artery without angina pectoris: Secondary | ICD-10-CM | POA: Diagnosis not present

## 2023-03-10 DIAGNOSIS — D649 Anemia, unspecified: Secondary | ICD-10-CM | POA: Diagnosis not present

## 2023-03-10 DIAGNOSIS — I10 Essential (primary) hypertension: Secondary | ICD-10-CM | POA: Diagnosis not present

## 2023-03-10 DIAGNOSIS — E1169 Type 2 diabetes mellitus with other specified complication: Secondary | ICD-10-CM | POA: Diagnosis not present

## 2023-03-11 LAB — BASIC METABOLIC PANEL
BUN/Creatinine Ratio: 27 — ABNORMAL HIGH (ref 10–24)
BUN: 34 mg/dL — ABNORMAL HIGH (ref 8–27)
CO2: 24 mmol/L (ref 20–29)
Calcium: 9.9 mg/dL (ref 8.6–10.2)
Chloride: 102 mmol/L (ref 96–106)
Creatinine, Ser: 1.28 mg/dL — ABNORMAL HIGH (ref 0.76–1.27)
Glucose: 104 mg/dL — ABNORMAL HIGH (ref 70–99)
Potassium: 4.2 mmol/L (ref 3.5–5.2)
Sodium: 141 mmol/L (ref 134–144)
eGFR: 61 mL/min/{1.73_m2} (ref >59)

## 2023-03-11 LAB — CBC
Hematocrit: 36.7 % — ABNORMAL LOW (ref 37.5–51.0)
Hemoglobin: 12.1 g/dL — ABNORMAL LOW (ref 13.0–17.7)
MCH: 31.7 pg (ref 26.6–33.0)
MCHC: 33 g/dL (ref 31.5–35.7)
MCV: 96 fL (ref 79–97)
Platelets: 264 10*3/uL (ref 150–450)
RBC: 3.82 x10E6/uL — ABNORMAL LOW (ref 4.14–5.80)
RDW: 12.2 % (ref 11.6–15.4)
WBC: 6.4 10*3/uL (ref 3.4–10.8)

## 2023-03-11 LAB — TSH: TSH: 1.07 u[IU]/mL (ref 0.450–4.500)

## 2023-03-13 ENCOUNTER — Other Ambulatory Visit: Payer: Self-pay

## 2023-03-13 DIAGNOSIS — I10 Essential (primary) hypertension: Secondary | ICD-10-CM

## 2023-03-13 DIAGNOSIS — R7989 Other specified abnormal findings of blood chemistry: Secondary | ICD-10-CM

## 2023-03-23 ENCOUNTER — Other Ambulatory Visit: Payer: Self-pay | Admitting: Cardiovascular Disease

## 2023-03-25 ENCOUNTER — Other Ambulatory Visit: Payer: Self-pay | Admitting: Internal Medicine

## 2023-03-29 DIAGNOSIS — R7989 Other specified abnormal findings of blood chemistry: Secondary | ICD-10-CM | POA: Diagnosis not present

## 2023-03-29 DIAGNOSIS — I10 Essential (primary) hypertension: Secondary | ICD-10-CM | POA: Diagnosis not present

## 2023-03-30 LAB — BASIC METABOLIC PANEL
BUN/Creatinine Ratio: 18 (ref 10–24)
BUN: 21 mg/dL (ref 8–27)
CO2: 25 mmol/L (ref 20–29)
Calcium: 9.7 mg/dL (ref 8.6–10.2)
Chloride: 101 mmol/L (ref 96–106)
Creatinine, Ser: 1.14 mg/dL (ref 0.76–1.27)
Glucose: 87 mg/dL (ref 70–99)
Potassium: 4.2 mmol/L (ref 3.5–5.2)
Sodium: 139 mmol/L (ref 134–144)
eGFR: 70 mL/min/{1.73_m2} (ref >59)

## 2023-05-16 DIAGNOSIS — K080 Exfoliation of teeth due to systemic causes: Secondary | ICD-10-CM | POA: Diagnosis not present

## 2023-06-15 ENCOUNTER — Ambulatory Visit: Payer: Medicare Other | Admitting: Internal Medicine

## 2023-06-24 ENCOUNTER — Other Ambulatory Visit: Payer: Self-pay | Admitting: Internal Medicine

## 2023-08-07 ENCOUNTER — Ambulatory Visit: Admitting: Internal Medicine

## 2023-08-16 ENCOUNTER — Ambulatory Visit (INDEPENDENT_AMBULATORY_CARE_PROVIDER_SITE_OTHER)

## 2023-08-16 ENCOUNTER — Ambulatory Visit: Admitting: Internal Medicine

## 2023-08-16 ENCOUNTER — Encounter: Payer: Self-pay | Admitting: Internal Medicine

## 2023-08-16 VITALS — BP 124/80 | HR 72 | Temp 97.9°F | Ht 68.0 in | Wt 192.0 lb

## 2023-08-16 VITALS — Ht 68.0 in | Wt 192.0 lb

## 2023-08-16 DIAGNOSIS — G8929 Other chronic pain: Secondary | ICD-10-CM

## 2023-08-16 DIAGNOSIS — H9110 Presbycusis, unspecified ear: Secondary | ICD-10-CM | POA: Diagnosis not present

## 2023-08-16 DIAGNOSIS — Z0000 Encounter for general adult medical examination without abnormal findings: Secondary | ICD-10-CM

## 2023-08-16 DIAGNOSIS — E785 Hyperlipidemia, unspecified: Secondary | ICD-10-CM | POA: Diagnosis not present

## 2023-08-16 DIAGNOSIS — I10 Essential (primary) hypertension: Secondary | ICD-10-CM

## 2023-08-16 DIAGNOSIS — M545 Low back pain, unspecified: Secondary | ICD-10-CM

## 2023-08-16 DIAGNOSIS — E1169 Type 2 diabetes mellitus with other specified complication: Secondary | ICD-10-CM | POA: Diagnosis not present

## 2023-08-16 DIAGNOSIS — E118 Type 2 diabetes mellitus with unspecified complications: Secondary | ICD-10-CM | POA: Diagnosis not present

## 2023-08-16 DIAGNOSIS — B351 Tinea unguium: Secondary | ICD-10-CM

## 2023-08-16 LAB — COMPREHENSIVE METABOLIC PANEL WITH GFR
ALT: 33 U/L (ref 0–53)
AST: 24 U/L (ref 0–37)
Albumin: 4.4 g/dL (ref 3.5–5.2)
Alkaline Phosphatase: 38 U/L — ABNORMAL LOW (ref 39–117)
BUN: 23 mg/dL (ref 6–23)
CO2: 30 meq/L (ref 19–32)
Calcium: 9.2 mg/dL (ref 8.4–10.5)
Chloride: 102 meq/L (ref 96–112)
Creatinine, Ser: 1.23 mg/dL (ref 0.40–1.50)
GFR: 60.11 mL/min (ref >60.00)
Glucose, Bld: 106 mg/dL — ABNORMAL HIGH (ref 70–99)
Potassium: 3.9 meq/L (ref 3.5–5.1)
Sodium: 138 meq/L (ref 135–145)
Total Bilirubin: 0.5 mg/dL (ref 0.2–1.2)
Total Protein: 7.2 g/dL (ref 6.0–8.3)

## 2023-08-16 LAB — CBC
HCT: 33.8 % — ABNORMAL LOW (ref 39.0–52.0)
Hemoglobin: 11.5 g/dL — ABNORMAL LOW (ref 13.0–17.0)
MCHC: 33.9 g/dL (ref 30.0–36.0)
MCV: 92 fl (ref 78.0–100.0)
Platelets: 227 K/uL (ref 150.0–400.0)
RBC: 3.68 Mil/uL — ABNORMAL LOW (ref 4.22–5.81)
RDW: 13.1 % (ref 11.5–15.5)
WBC: 5.4 K/uL (ref 4.0–10.5)

## 2023-08-16 LAB — LIPID PANEL
Cholesterol: 108 mg/dL (ref 0–200)
HDL: 36.7 mg/dL — ABNORMAL LOW (ref >39.00)
LDL Cholesterol: 53 mg/dL (ref 0–99)
NonHDL: 71.6
Total CHOL/HDL Ratio: 3
Triglycerides: 92 mg/dL (ref 0.0–149.0)
VLDL: 18.4 mg/dL (ref 0.0–40.0)

## 2023-08-16 LAB — MICROALBUMIN / CREATININE URINE RATIO
Creatinine,U: 159.6 mg/dL
Microalb Creat Ratio: 6.3 mg/g (ref 0.0–30.0)
Microalb, Ur: 1 mg/dL (ref 0.0–1.9)

## 2023-08-16 LAB — HEMOGLOBIN A1C: Hgb A1c MFr Bld: 7 % — ABNORMAL HIGH (ref 4.6–6.5)

## 2023-08-16 NOTE — Progress Notes (Signed)
 Subjective:   Patient ID: [name] [name], male    DOB: 1955-12-26, 68 y.o.   MRN: [id number]  Discussed the use of AI scribe software for clinical note transcription with the patient, who gave verbal consent to proceed. History of Present Illness [name] [name] is a 68 year old male who presents with concerns about weight gain.  He has experienced a significant weight gain since his last visit, increasing from approximately 177-178 pounds in February to 192 pounds currently, indicating a gain of about 12 pounds. He recalls weighing 184 pounds in December, suggesting fluctuations in his weight over the past months. He denies any changes in his diet but acknowledges possibly being less active, although he walks regularly at work. No new chest pains, tightness, pressure, or breathing problems. No new stomach issues such as diarrhea, constipation, heartburn, or blood in stools. He takes a daily antacid.  He continues to experience chronic back pain, which he manages with ibuprofen at night and Tylenol  Arthritis during the day. The pain is persistent but not worsening, though it seems to occur more frequently. He previously received a shot for the pain a few years ago, which was initially effective, but subsequent injections were not.  He mentions a toenail issue, describing a yellow discoloration and a split in the nail, which he attributes to possible trauma or fungus.  Review of Systems  Constitutional: Negative.   HENT: Negative.    Eyes: Negative.   Respiratory:  Negative for cough, chest tightness and shortness of breath.   Cardiovascular:  Negative for chest pain, palpitations and leg swelling.  Gastrointestinal:  Negative for abdominal distention, abdominal pain, constipation, diarrhea, nausea and vomiting.  Musculoskeletal:  Positive for back pain.  Skin: Negative.   Neurological: Negative.   Psychiatric/Behavioral: Negative.      Objective:  Physical Exam Constitutional:       Appearance: He is well-developed.  HENT:     Head: Normocephalic and atraumatic.  Cardiovascular:     Rate and Rhythm: Normal rate and regular rhythm.  Pulmonary:     Effort: Pulmonary effort is normal. No respiratory distress.     Breath sounds: Normal breath sounds. No wheezing or rales.  Abdominal:     General: Bowel sounds are normal. There is no distension.     Palpations: Abdomen is soft.     Tenderness: There is no abdominal tenderness. There is no rebound.  Musculoskeletal:     Cervical back: Normal range of motion.  Skin:    General: Skin is warm and dry.     Comments: Foot exam done  Neurological:     Mental Status: He is alert and oriented to person, place, and time.     Coordination: Coordination normal.    Vitals:   08/16/23 0817  BP: 124/80  Pulse: 72  Temp: 97.9 F (36.6 C)  TempSrc: Oral  SpO2: 92%  Weight: 192 lb (87.1 kg)  Height: 5' 8 (1.727 m)   Assessment and Plan Assessment & Plan Abnormal weight gain   Weight has increased by 12 pounds since February, reaching 192 pounds. No significant changes in diet or activity level.  Chronic low back pain   Pain persists with increased frequency. Currently managed with ibuprofen and Tylenol  Arthritis. Previous steroid injections were initially effective but later unsuccessful. Nerve ablation was discussed for longer-lasting relief. Check yearly labs, including liver function tests, to monitor potential effects of pain medication use.  Onychomycosis (toenail fungus)   Yellowish discoloration and  splitting of the toenail likely due to onychomycosis or trauma. Circulation and pulses in feet are good. Treatment options discussed include prescription lacquer and over-the-counter creams, with limited efficacy of 30% and 20%, respectively.

## 2023-08-16 NOTE — Progress Notes (Signed)
 Subjective:   [name] [name] is a 68 y.o. who presents for a Medicare Wellness preventive visit.  As a reminder, Annual Wellness Visits don't include a physical exam, and some assessments may be limited, especially if this visit is performed virtually. We may recommend an in-person follow-up visit with your provider if needed.  Visit Complete: Virtual I connected with  [name] on 08/16/23 by a audio enabled telemedicine application and verified that I am speaking with the correct person using two identifiers.  Patient Location: Home  Provider Location: Home Office  I discussed the limitations of evaluation and management by telemedicine. The patient expressed understanding and agreed to proceed.  Vital Signs: Because this visit was a virtual/telehealth visit, some criteria may be missing or patient reported. Any vitals not documented were not able to be obtained and vitals that have been documented are patient reported.  VideoDeclined- This patient declined Librarian, academic. Therefore the visit was completed with audio only.  Persons Participating in Visit: Patient.  AWV Questionnaire: No: Patient Medicare AWV questionnaire was not completed prior to this visit.  Cardiac Risk Factors include: advanced age (>65men, >1 women);male gender;hypertension;diabetes mellitus;dyslipidemia;Other (see comment), Risk factor comments: CAD     Objective:    Today's Vitals   08/16/23 1057  Weight: 192 lb (87.1 kg)  Height: 5' 8 (1.727 m)   Body mass index is 29.19 kg/m.     08/16/2023   11:19 AM 09/14/2019   10:16 AM 07/15/2019    9:43 AM 05/29/2015    1:50 PM 05/22/2015    8:18 AM 04/23/2015    3:11 PM 09/05/2014    8:45 AM  Advanced Directives  Does Patient Have a Medical Advance Directive? No No No No  No  No  No   Would patient like information on creating a medical advance directive?  No - Patient declined  Yes - Transport planner given   Yes - Transport planner given  No - patient declined information  Yes - Transport planner given      Data saved with a previous flowsheet row definition    Current Medications (verified) Outpatient Encounter Medications as of 08/16/2023  Medication Sig   acetaminophen  (TYLENOL ) 650 MG CR tablet Take 650 mg by mouth every 8 (eight) hours as needed for pain.   aspirin  EC 81 MG tablet Take 1 tablet (81 mg total) by mouth daily.   Blood Glucose Monitoring Suppl (ONE TOUCH ULTRA 2) w/Device KIT Check blood sugar once daily.   Camphor-Menthol -Methyl Sal (SALONPAS) 3.01-16-08 % PTCH Apply 1 patch topically daily as needed (back pain).   clotrimazole  (LOTRIMIN ) 1 % cream Apply topically 2 (two) times daily.   ezetimibe  (ZETIA ) 10 MG tablet TAKE 1 TABLET BY MOUTH DAILY   losartan -hydrochlorothiazide  (HYZAAR) 100-25 MG tablet TAKE 1 TABLET BY MOUTH DAILY   meclizine  (ANTIVERT ) 25 MG tablet Take 1 tablet (25 mg total) by mouth 3 (three) times daily as needed for dizziness.   Menthol , Topical Analgesic, (BENGAY EX) Apply 1 application topically daily as needed (back pain).   metoprolol  succinate (TOPROL  XL) 25 MG 24 hr tablet Take 1 tablet (25 mg total) by mouth daily.   nitroGLYCERIN  (NITROSTAT ) 0.4 MG SL tablet Place 1 tablet (0.4 mg total) under the tongue every 5 (five) minutes as needed for chest pain.   simvastatin  (ZOCOR ) 40 MG tablet TAKE 1 TABLET (40 MG TOTAL) BY MOUTH DAILY AT 6 PM.   No facility-administered encounter medications on  file as of 08/16/2023.    Allergies (verified) Codeine   History: Past Medical History:  Diagnosis Date   Aortic valve sclerosis 03/08/2023   TTE 03/08/2023: EF 65-70, no RWMA, GLS -21.7, normal RVSF, trivial MR, AV sclerosis, mean AV gradient 5.9 mmHg, RAP 3    Arthritis    Blood clot in vein    superficial / right leg following CABG   CAD (coronary artery disease)    Carotid artery stenosis 02/14/2023   US  08/31/21: Bilat ICA 1-39     Chest pain     Colon polyps    Complication of anesthesia    hard to wake up   Essential hypertension, benign    Headache    migraines as teenager   Heart murmur    History of blood transfusion    Hyperlipemia    Obesity    Tobacco abuse    Past Surgical History:  Procedure Laterality Date   CARDIAC CATHETERIZATION     CIRCUMCISION  1986   CORONARY ARTERY BYPASS GRAFT     Coronary artery bypass grafting x4  10/21/2010   CYSTOSCOPY WITH RETROGRADE PYELOGRAM, URETEROSCOPY AND STENT PLACEMENT Left 09/01/2014   Procedure: CYSTOSCOPY WITH RETROGRADE PYELOGRAM, URETEROSCOPY AND STENT PLACEMENT;  Surgeon: Norleen Seltzer, MD;  Location: [facility];  Service: Urology;  Laterality: Left;   CYSTOSCOPY WITH URETEROSCOPY AND STENT PLACEMENT Left 09/09/2014   Procedure: CYSTOSCOPY,LEFT URETEROSCOPY AND STENT PLACEMENT;  Surgeon: Norleen Seltzer, MD;  Location: [facility];  Service: Urology;  Laterality: Left;   HEMORRHOID SURGERY     HOLMIUM LASER APPLICATION Left 09/09/2014   Procedure: WITH HOLMIUM LASER ;  Surgeon: Norleen Seltzer, MD;  Location: [facility];  Service: Urology;  Laterality: Left;   Stapled hemorrhoidopexy.  10/19/2006   TOTAL HIP ARTHROPLASTY Left 05/29/2015   Procedure: LEFT TOTAL HIP ARTHROPLASTY ANTERIOR APPROACH;  Surgeon: Lonni CINDERELLA Poli, MD;  Location: [facility];  Service: Orthopedics;  Laterality: Left;   VASECTOMY  1986   Family History  Problem Relation Age of Onset   Leukemia Mother    Hypertension Mother    Hypertension Father    Colon cancer Neg Hx    Colon polyps Neg Hx    Esophageal cancer Neg Hx    Rectal cancer Neg Hx    Stomach cancer Neg Hx    Social History   Socioeconomic History   Marital status: Married    Spouse name: Olam   Number of children: Not on file   Years of education: Not on file   Highest education level: Associate degree: occupational, Scientist, product/process development, or vocational program  Occupational History   Occupation: [employer]  Tobacco Use   Smoking status: Former     Current packs/day: 0.00    Average packs/day: 0.5 packs/day for 7.0 years (3.5 ttl pk-yrs)    Types: Cigarettes    Start date: 01/10/1974    Quit date: 01/10/1981    Years since quitting: 42.6   Smokeless tobacco: Never  Vaping Use   Vaping status: Never Used  Substance and Sexual Activity   Alcohol use: No    Alcohol/week: 0.0 standard drinks of alcohol   Drug use: No   Sexual activity: Not on file  Other Topics Concern   Not on file  Social History Narrative   Semi aerobic workouts   Lives with wife/2025   Social Drivers of Health   Financial Resource Strain: Low Risk  (08/16/2023)   Overall Financial Resource Strain (CARDIA)    Difficulty  of Paying Living Expenses: Not hard at all  Food Insecurity: No Food Insecurity (08/16/2023)   Hunger Vital Sign    Worried About Running Out of Food in the Last Year: Never true    Ran Out of Food in the Last Year: Never true  Transportation Needs: No Transportation Needs (08/16/2023)   PRAPARE - Administrator, Civil Service (Medical): No    Lack of Transportation (Non-Medical): No  Physical Activity: Insufficiently Active (08/16/2023)   Exercise Vital Sign    Days of Exercise per Week: 7 days    Minutes of Exercise per Session: 10 min  Stress: No Stress Concern Present (08/16/2023)   [employer] of Occupational Health - Occupational Stress Questionnaire    Feeling of Stress: Not at all  Social Connections: Socially Integrated (08/16/2023)   Social Connection and Isolation Panel    Frequency of Communication with Friends and Family: More than three times a week    Frequency of Social Gatherings with Friends and Family: Once a week    Attends Religious Services: More than 4 times per year    Active Member of [employer] or Organizations: Yes    Attends Banker Meetings: Never    Marital Status: Married    Tobacco Counseling Counseling given: Not Answered    Clinical Intake:  Pre-visit preparation completed:  Yes  Pain : No/denies pain     BMI - recorded: 29.19 Nutritional Status: BMI 25 -29 Overweight Nutritional Risks: None Diabetes: Yes CBG done?: No Did pt. bring in CBG monitor from home?: No  Lab Results  Component Value Date   HGBA1C 6.4 12/15/2022   HGBA1C 6.6 (H) 08/17/2022   HGBA1C 6.5 05/06/2022     How often do you need to have someone help you when you read instructions, pamphlets, or other written materials from your doctor or pharmacy?: 1 - Never  Interpreter Needed?: No  Information entered by :: Joden Bonsall, RMA   Activities of Daily Living     08/16/2023   10:59 AM  In your present state of health, do you have any difficulty performing the following activities:  Hearing? 0  Comment would like a hearing test  Vision? 0  Difficulty concentrating or making decisions? 0  Walking or climbing stairs? 0  Dressing or bathing? 0  Doing errands, shopping? 0  Preparing Food and eating ? N  Using the Toilet? N  In the past six months, have you accidently leaked urine? N  Do you have problems with loss of bowel control? N  Managing your Medications? N  Managing your Finances? N  Housekeeping or managing your Housekeeping? N    Patient Care Team: Rollene Almarie LABOR, MD as PCP - General (Internal Medicine) Delford Maude BROCKS, MD as PCP - Cardiology (Cardiology) Delford Maude BROCKS, MD as Consulting Physician (Cardiology) Army Dallas NOVAK, MD (Inactive) (Cardiothoracic Surgery)  I have updated your Care Teams any recent Medical Services you may have received from other providers in the past year.     Assessment:   This is a routine wellness examination for Trisha.  Hearing/Vision screen Hearing Screening - Comments:: Denies hearing difficulties   Vision Screening - Comments:: Wears contact lenses[facility]   Goals Addressed               This Visit's Progress     Patient Stated (pt-stated)        Lose some weight/2025  Depression Screen     08/16/2023   11:20 AM 12/15/2022    8:37 AM 08/17/2022    8:09 AM 05/06/2022    8:16 AM 07/27/2021    8:04 AM 03/20/2015    9:41 AM 03/20/2015    9:40 AM  PHQ 2/9 Scores  PHQ - 2 Score 0 0 0 0 0 0 0  PHQ- 9 Score 0 0   0      Fall Risk     08/16/2023   11:19 AM 08/16/2023    8:31 AM 12/15/2022    8:37 AM 08/17/2022    8:09 AM 05/06/2022    8:16 AM  Fall Risk   Falls in the past year? 0 0 0 0 0  Number falls in past yr:  0 0 0 0  Injury with Fall?  0 0 0 0  Risk for fall due to :    No Fall Risks No Fall Risks  Follow up Falls evaluation completed;Falls prevention discussed Falls evaluation completed Falls evaluation completed Falls evaluation completed Falls evaluation completed    MEDICARE RISK AT HOME:  Medicare Risk at Home Any stairs in or around the home?: No If so, are there any without handrails?: No Home free of loose throw rugs in walkways, pet beds, electrical cords, etc?: Yes Adequate lighting in your home to reduce risk of falls?: Yes Life alert?: No Use of a cane, walker or w/c?: No Grab bars in the bathroom?: Yes Shower chair or bench in shower?: No Elevated toilet seat or a handicapped toilet?: Yes  TIMED UP AND GO:  Was the test performed?  No  Cognitive Function: Declined/Normal: No cognitive concerns noted by patient or family. Patient alert, oriented, able to answer questions appropriately and recall recent events. No signs of memory loss or confusion.        Immunizations Immunization History  Administered Date(s) Administered   Fluad Quad(high Dose 65+) 09/23/2020, 11/03/2021   Influenza Split 11/01/2012, 10/10/2013, 11/11/2014   Influenza,inj,Quad PF,6+ Mos 09/05/2018, 09/23/2019   Influenza-Unspecified 11/02/2022   Moderna SARS-COV2 Booster Vaccination 07/31/2020   Moderna Sars-Covid-2 Vaccination 01/24/2019, 02/14/2019, 11/04/2019   PNEUMOCOCCAL CONJUGATE-20 07/27/2021   Pneumococcal Conjugate-13 09/05/2018    Pneumococcal Polysaccharide-23 05/25/2015   Td 05/09/2007   Tdap 04/05/2017    Screening Tests Health Maintenance  Topic Date Due   OPHTHALMOLOGY EXAM  Never done   Diabetic kidney evaluation - Urine ACR  Never done   Zoster Vaccines- Shingrix (1 of 2) Never done   COVID-19 Vaccine (4 - 2024-25 season) 09/11/2022   HEMOGLOBIN A1C  06/15/2023   INFLUENZA VACCINE  08/11/2023   Medicare Annual Wellness (AWV)  08/17/2023   Diabetic kidney evaluation - eGFR measurement  03/28/2024   FOOT EXAM  08/15/2024   Colonoscopy  12/26/2025   DTaP/Tdap/Td (3 - Td or Tdap) 04/06/2027   Pneumococcal Vaccine: 50+ Years  Completed   Hepatitis C Screening  Completed   Hepatitis B Vaccines  Aged Out   HPV VACCINES  Aged Out   Meningococcal B Vaccine  Aged Out    Health Maintenance  Health Maintenance Due  Topic Date Due   OPHTHALMOLOGY EXAM  Never done   Diabetic kidney evaluation - Urine ACR  Never done   Zoster Vaccines- Shingrix (1 of 2) Never done   COVID-19 Vaccine (4 - 2024-25 season) 09/11/2022   HEMOGLOBIN A1C  06/15/2023   INFLUENZA VACCINE  08/11/2023   Medicare Annual Wellness (AWV)  08/17/2023   Health Maintenance  Items Addressed: See Nurse Notes at the end of this note  Additional Screening:  Vision Screening: Recommended annual ophthalmology exams for early detection of glaucoma and other disorders of the eye. Would you like a referral to an eye doctor? No    Dental Screening: Recommended annual dental exams for proper oral hygiene  Community Resource Referral / Chronic Care Management: CRR required this visit?  No   CCM required this visit?  No   Plan:    I have personally reviewed and noted the following in the patient's chart:   Medical and social history Use of alcohol, tobacco or illicit drugs  Current medications and supplements including opioid prescriptions. Patient is not currently taking opioid prescriptions. Functional ability and status Nutritional  status Physical activity Advanced directives List of other physicians Hospitalizations, surgeries, and ER visits in previous 12 months Vitals Screenings to include cognitive, depression, and falls Referrals and appointments  In addition, I have reviewed and discussed with patient certain preventive protocols, quality metrics, and best practice recommendations. A written personalized care plan for preventive services as well as general preventive health recommendations were provided to patient.   Yehudis Monceaux L Xinyi Batton, CMA   08/16/2023   After Visit Summary: (MyChart) Due to this being a telephonic visit, the after visit summary with patients personalized plan was offered to patient via MyChart   Notes: Patient is due for a Shingrix vaccine.  He is requesting a hearing test due to hearing loss, he thinks.  I have placed a referral for test today.  Patient stated that he has had a recent eye exam.  I have sent a request for exam results in today.

## 2023-08-16 NOTE — Patient Instructions (Signed)
 Mr. [name] [name] , Thank you for taking time out of your busy schedule to complete your Annual Wellness Visit with me. I enjoyed our conversation and look forward to speaking with you again next year. I, as well as your care team,  appreciate your ongoing commitment to your health goals. Please review the following plan we discussed and let me know if I can assist you in the future. Your Game plan/ To Do List    Referrals: If you haven't heard from the office you've been referred to, please reach out to them at the phone provided.   Follow up Visits: We will see or speak with you next year for your Next Medicare AWV with our clinical staff Have you seen your provider in the last 6 months (3 months if uncontrolled diabetes)? Yes  Clinician Recommendations:  Aim for 30 minutes of exercise or brisk walking, 6-8 glasses of water , and 5 servings of fruits and vegetables each day. You are due for a Shingles vaccine.  Keep up the good work.      This is a list of the screenings recommended for you:  Health Maintenance  Topic Date Due   Eye exam for diabetics  Never done   Yearly kidney health urinalysis for diabetes  Never done   Zoster (Shingles) Vaccine (1 of 2) Never done   COVID-19 Vaccine (4 - 2024-25 season) 09/11/2022   Hemoglobin A1C  06/15/2023   Flu Shot  08/11/2023   Medicare Annual Wellness Visit  08/17/2023   Yearly kidney function blood test for diabetes  03/28/2024   Complete foot exam   08/15/2024   Colon Cancer Screening  12/26/2025   DTaP/Tdap/Td vaccine (3 - Td or Tdap) 04/06/2027   Pneumococcal Vaccine for age over 72  Completed   Hepatitis C Screening  Completed   Hepatitis B Vaccine  Aged Out   HPV Vaccine  Aged Out   Meningitis B Vaccine  Aged Out    Advanced directives: (Declined) Advance directive discussed with you today. Even though you declined this today, please call our office should you change your mind, and we can give you the proper paperwork for you to fill  out. Advance Care Planning is important because it:  [x]  Makes sure you receive the medical care that is consistent with your values, goals, and preferences  [x]  It provides guidance to your family and loved ones and reduces their decisional burden about whether or not they are making the right decisions based on your wishes.  Follow the link provided in your after visit summary or read over the paperwork we have mailed to you to help you started getting your Advance Directives in place. If you need assistance in completing these, please reach out to us  so that we can help you!  See attachments for Preventive Care and Fall Prevention Tips.

## 2023-08-16 NOTE — Patient Instructions (Signed)
 We will check the labs today.

## 2023-08-17 ENCOUNTER — Encounter: Payer: Self-pay | Admitting: Internal Medicine

## 2023-08-17 DIAGNOSIS — B351 Tinea unguium: Secondary | ICD-10-CM | POA: Insufficient documentation

## 2023-08-17 NOTE — Assessment & Plan Note (Signed)
 BP at goal on medications and checking CMP adjust as needed losartan /hydrochlorothiazide  and metoprolol 

## 2023-08-17 NOTE — Assessment & Plan Note (Signed)
 Checking lipid panel and adjust as needed.

## 2023-08-17 NOTE — Assessment & Plan Note (Signed)
 Pain persists with increased frequency. Currently managed with ibuprofen and Tylenol  Arthritis. Previous steroid injections were initially effective but later unsuccessful. Nerve ablation was discussed for longer-lasting relief. Check yearly labs, including liver function tests, to monitor potential effects of pain medication use

## 2023-08-17 NOTE — Assessment & Plan Note (Signed)
 Treatment options discussed include prescription lacquer and over-the-counter creams, with limited efficacy of 30% and 20%, respectively.

## 2023-08-17 NOTE — Assessment & Plan Note (Signed)
 Foot exam done reminded about eye exam. Checking UACR, HgA1c, CMP, lipid panel and adjust as needed.

## 2023-08-21 ENCOUNTER — Ambulatory Visit: Payer: Self-pay | Admitting: Internal Medicine

## 2023-09-05 ENCOUNTER — Ambulatory Visit: Admitting: Audiologist

## 2023-09-22 ENCOUNTER — Other Ambulatory Visit: Payer: Self-pay | Admitting: Internal Medicine

## 2023-12-06 ENCOUNTER — Telehealth: Payer: Self-pay | Admitting: Physician Assistant

## 2023-12-06 NOTE — Telephone Encounter (Signed)
 Left arm and neck pain No other symptoms. Pain comes and goes

## 2023-12-06 NOTE — Telephone Encounter (Signed)
 Patient reports for the past week he noticed his left arm feel sore, similar to when lifting heavy objects repeatedly. He report feeling this in his neck as well. The soreness is intermittent and reproducible when he rubs the muscle of his arm/neck.  Denies any SOB, chest pain/tightness/pressure. Denies any recent injuries.  Informed patient I will send message to Dr. Delford to review, and recommended he also call his PCP to discuss symptoms.  Advised patient on ED precautions and NTG use. Patient verbalized understanding.

## 2023-12-10 ENCOUNTER — Other Ambulatory Visit: Payer: Self-pay | Admitting: Cardiovascular Disease

## 2023-12-11 NOTE — Telephone Encounter (Signed)
 Spoke with pt and advised per Dr Delford soreness/discomfort does sound muscular and recommends pt follow up with his PCP.  Pt verbalizes understanding and agrees with current plan.

## 2023-12-12 DIAGNOSIS — K080 Exfoliation of teeth due to systemic causes: Secondary | ICD-10-CM | POA: Diagnosis not present

## 2023-12-14 ENCOUNTER — Other Ambulatory Visit: Payer: Self-pay | Admitting: Cardiovascular Disease

## 2023-12-22 ENCOUNTER — Other Ambulatory Visit: Payer: Self-pay | Admitting: Internal Medicine

## 2024-02-10 ENCOUNTER — Other Ambulatory Visit: Payer: Self-pay | Admitting: Physician Assistant

## 2024-02-15 DIAGNOSIS — R001 Bradycardia, unspecified: Secondary | ICD-10-CM | POA: Insufficient documentation

## 2024-02-15 NOTE — Progress Notes (Unsigned)
 " OFFICE NOTE:    Date:  02/16/2024  ID:  [name], DOB 1955/06/28, MRN [id number] PCP: Rollene Almarie LABOR, MD  [facility] Providers Cardiologist:  Maude Emmer, MD        Coronary artery disease  S/p CABG in 2012 Myoview  08/31/21: no ischemia or infarction, EF 55; low risk  AV Sclerosis TTE 03/08/2023: EF 65-70, no RWMA, GLS -21.7, normal RVSF, trivial MR, AV sclerosis, mean AV gradient 5.9 mmHg, RAP 3  Hypertension  Hyperlipidemia   Pre-diabetes  Anemia  Carotid artery stenosis  US  08/31/21: Bilat ICA 1-39         Discussed the use of AI scribe software for clinical note transcription with the patient, who gave verbal consent to proceed. History of Present Illness [name] [name] is a 69 y.o. male fo follow up of CAD. He was last seen in 02/2023. Metoprolol  was reduced due to bradycardia.   Approximately one and a half months ago, he experienced discomfort in his left arm, which was uncomfortable but not painful. He currently feels discomfort on the left side of his neck and is uncertain if it is muscular in origin. No chest discomfort, nausea, or jaw pain. He experiences occasional shortness of breath, particularly at the end of the day. He walks approximately 15 minutes twice a day at a fast pace and notices more labored breathing in the evening, without any chest discomfort during these activities. He denies smoking and quit when his wife was pregnant with their child, who is now 59 years old. He also quit drinking alcohol in 1989. 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    ROS-See HPI    Studies Reviewed:  EKG Interpretation Date/Time:  Friday February 16 2024 08:05:06 EST Ventricular Rate:  52 PR Interval:  166 QRS Duration:  86 QT Interval:  432 QTC Calculation: 401 R Axis:   73  Text Interpretation: Sinus bradycardia Nonspecific T wave abnormality When compared with ECG of 15-Feb-2023 08:18, No significant  change was found Confirmed by Lelon Hamilton [phone]) on 02/16/2024 8:07:25 AM    LABS 03/10/23: TSH 1.07 08/16/23: K 3.9, SCr 1.23, eGFR 60, ALT 33, TC 108, HDL 36.7, LDL 53, Trig 92, Hgb 11.5, PLT 227K         Physical Exam:  VS:  BP 132/74 (BP Location: Right Arm, Patient Position: Sitting)   Pulse (!) 52   Ht 5' 8 (1.727 m)   Wt 192 lb (87.1 kg)   SpO2 97%   BMI 29.19 kg/m        Wt Readings from Last 3 Encounters:  02/16/24 192 lb (87.1 kg)  08/16/23 192 lb (87.1 kg)  08/16/23 192 lb (87.1 kg)    Constitutional:      Appearance: Healthy appearance. Not in distress.  Neck:     Vascular: No carotid bruit. JVD normal.  Pulmonary:     Breath sounds: Normal breath sounds. No wheezing. No rales.  Cardiovascular:     Normal rate. Regular rhythm.     Murmurs: There is a grade 2/6 systolic murmur at the URSB.  Edema:    Peripheral edema absent.  Abdominal:     Palpations: Abdomen is soft.       Assessment and Plan:    Assessment & Plan Coronary artery disease involving native coronary artery of native heart without angina pectoris Shortness of breath Status post CABG in 2012.  Myoview  in August  2023 low risk and negative for ischemia.  He is not having chest discomfort.  However, he has noted more labored breathing with long walks.  In review of his history, he did not have chest symptoms or shortness of breath prior to his bypass surgery.  His last tress test was in 2023.  It would be reasonable to repeat a stress test to ensure his dyspnea is not an anginal equivalent. - Order exercise Myoview  - Continue aspirin  81 mg daily, Zetia  10 mg daily, Toprol -XL 25 mg daily, nitroglycerin  as needed, simvastatin  40 mg daily - Follow-up 1 year or sooner if stress test abnormal/symptoms worsen Essential hypertension Blood pressure controlled. - Continue losartan /HCTZ 100/25 mg daily, Toprol -XL 25 mg daily Hyperlipidemia associated with type 2 diabetes mellitus (HCC) LDL optimal in Aug  2025.  - Can sinew simvastatin  40 mg daily, Zetia  10 mg daily Bilateral carotid artery stenosis US  08/2021 with bilateral ICA stenosis 1-39%. - Arrange follow-up carotid US   Bradycardia, sinus Heart rate improved since reduction in metoprolol  dose.       Informed Consent   Shared Decision Making/Informed Consent The risks [chest pain, shortness of breath, cardiac arrhythmias, dizziness, blood pressure fluctuations, myocardial infarction, stroke/transient ischemic attack, nausea, vomiting, allergic reaction, radiation exposure, metallic taste sensation and life-threatening complications (estimated to be 1 in 10,000)], benefits (risk stratification, diagnosing coronary artery disease, treatment guidance) and alternatives of a nuclear stress test were discussed in detail with Mr. Pilley and he agrees to proceed.     Dispo:  Return in about 1 year (around 02/15/2025) for Routine Follow Up, w/ Dr. Delford, or Glendia Ferrier, PA-C.  Signed, Glendia Ferrier, PA-C   "

## 2024-02-15 NOTE — Assessment & Plan Note (Signed)
 US  08/2021 with bilateral ICA stenosis 1-39%.***

## 2024-02-15 NOTE — Assessment & Plan Note (Signed)
 Status post CABG in 2012.  Myoview  in August 2023 low risk and negative for ischemia. ***

## 2024-02-15 NOTE — Assessment & Plan Note (Signed)
 LDL optimal in Aug 2025. ***

## 2024-02-16 ENCOUNTER — Ambulatory Visit: Admitting: Physician Assistant

## 2024-02-16 ENCOUNTER — Encounter: Payer: Self-pay | Admitting: Physician Assistant

## 2024-02-16 ENCOUNTER — Ambulatory Visit: Admitting: Internal Medicine

## 2024-02-16 VITALS — BP 132/74 | HR 52 | Ht 68.0 in | Wt 192.0 lb

## 2024-02-16 DIAGNOSIS — R001 Bradycardia, unspecified: Secondary | ICD-10-CM

## 2024-02-16 DIAGNOSIS — I10 Essential (primary) hypertension: Secondary | ICD-10-CM

## 2024-02-16 DIAGNOSIS — E1169 Type 2 diabetes mellitus with other specified complication: Secondary | ICD-10-CM

## 2024-02-16 DIAGNOSIS — E785 Hyperlipidemia, unspecified: Secondary | ICD-10-CM

## 2024-02-16 DIAGNOSIS — I2510 Atherosclerotic heart disease of native coronary artery without angina pectoris: Secondary | ICD-10-CM

## 2024-02-16 DIAGNOSIS — I6523 Occlusion and stenosis of bilateral carotid arteries: Secondary | ICD-10-CM

## 2024-02-16 DIAGNOSIS — R0602 Shortness of breath: Secondary | ICD-10-CM

## 2024-02-16 MED ORDER — NITROGLYCERIN 0.4 MG SL SUBL: 0.4000 mg | SUBLINGUAL_TABLET | SUBLINGUAL | 11 refills | Status: AC | PRN

## 2024-02-16 NOTE — Patient Instructions (Signed)
 Medication Instructions:  Meds ordered this encounter  Medications   nitroGLYCERIN  (NITROSTAT ) 0.4 MG SL tablet    Sig: Place 1 tablet (0.4 mg total) under the tongue every 5 (five) minutes as needed for chest pain.    Dispense:  25 tablet    Refill:  11    Supervising Provider:   PIETRO REDELL RAMAN [[id number]]    *If you need a refill on your cardiac medications before your next appointment, please call your pharmacy*   Testing/Procedures:  EXERCISE TOLERANCE TEST  Your physician has requested that you have an Exercise Stress Test. An exercise tolerance test is a test to check how your heart works during exercise. You will need to walk on a treadmill for this test. An electrocardiogram (ECG) will record your heartbeat when you are at rest and when you are exercising.    Please arrive 15 minutes prior to your appointment time for registration and insurance purposes.   The test will take approximately 45 minutes to complete.   How to prepare for your Exercise Stress Test: Do bring a list of your current medications with you.  If not listed below, you may take your medications as normal. Do wear comfortable clothes (no dresses or overalls) and walking shoes, tennis shoes preferred (no heels or open toed shoes are allowed) Do Not wear cologne, perfume, aftershave or lotions (deodorant is allowed). Do not drink or eat foods with caffeine for 24 hours before the test. (Chocolate, coffee, tea, or energy drinks) If you use an inhaler, bring it with you to the test. Do not smoke for 4 hours before the test.    If these instructions are not followed, your test will have to be rescheduled.   If you cannot keep your appointment, please provide 24 hours notification to our office, to avoid a possible $50 charge to your account.    CAROTID ARTERY DUPLEX  Your physician has requested that you have a carotid duplex. This test is an ultrasound of the carotid arteries in your neck. It looks at blood  flow through these arteries that supply the brain with blood. Allow one hour for this exam. There are no restrictions or special instructions.    Follow-Up: At [facility], you and your health needs are our priority.  As part of our continuing mission to provide you with exceptional heart care, our providers are all part of one team.  This team includes your primary Cardiologist (physician) and Advanced Practice Providers or APPs (Physician Assistants and Nurse Practitioners) who all work together to provide you with the care you need, when you need it.  Your next appointment:   1 year(s)  Provider:   Maude Emmer, MD or Glendia Ferrier, PA-C

## 2024-02-16 NOTE — Assessment & Plan Note (Addendum)
 Heart rate improved since reduction in metoprolol  dose.

## 2024-02-16 NOTE — Assessment & Plan Note (Signed)
 Blood pressure controlled. - Continue losartan /HCTZ 100/25 mg daily, Toprol -XL 25 mg daily

## 2024-02-20 ENCOUNTER — Ambulatory Visit (HOSPITAL_COMMUNITY)

## 2024-02-23 ENCOUNTER — Ambulatory Visit: Admitting: Internal Medicine

## 2024-03-01 ENCOUNTER — Encounter (HOSPITAL_COMMUNITY)

## 2024-08-16 ENCOUNTER — Ambulatory Visit
